# Patient Record
Sex: Female | Born: 1995 | Race: White | Hispanic: No | Smoking: Never smoker
Health system: Southern US, Community
[De-identification: ages and names within clinical notes are randomized; demographics above are authoritative.]

## PROBLEM LIST (undated history)

## (undated) DIAGNOSIS — M549 Dorsalgia, unspecified: Secondary | ICD-10-CM

## (undated) DIAGNOSIS — M1990 Unspecified osteoarthritis, unspecified site: Secondary | ICD-10-CM

## (undated) DIAGNOSIS — G43909 Migraine, unspecified, not intractable, without status migrainosus: Secondary | ICD-10-CM

## (undated) DIAGNOSIS — K589 Irritable bowel syndrome without diarrhea: Secondary | ICD-10-CM

## (undated) HISTORY — PX: OTHER SURGICAL HISTORY: SHX169

## (undated) HISTORY — PX: INDUCED ABORTION: SHX677

---
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
## 2017-06-02 ENCOUNTER — Emergency Department (HOSPITAL_COMMUNITY)
Admission: EM | Admit: 2017-06-02 | Discharge: 2017-06-03 | Disposition: A | Discharge status: Home / Self Care | Payer: Medicaid Other | Attending: Emergency Medicine | Admitting: Emergency Medicine

## 2017-06-02 ENCOUNTER — Encounter (HOSPITAL_COMMUNITY): Payer: Self-pay | Admitting: Family Medicine

## 2017-06-02 DIAGNOSIS — R51 Headache: Secondary | ICD-10-CM | POA: Diagnosis not present

## 2017-06-02 DIAGNOSIS — R519 Headache, unspecified: Secondary | ICD-10-CM

## 2017-06-02 DIAGNOSIS — Z79899 Other long term (current) drug therapy: Secondary | ICD-10-CM | POA: Insufficient documentation

## 2017-06-02 MED ORDER — KETOROLAC TROMETHAMINE 30 MG/ML IJ SOLN
30.0000 mg | Freq: Once | INTRAMUSCULAR | Status: AC
  Administered 2017-06-03: 30 mg via INTRAVENOUS
  Filled 2017-06-02: qty 1

## 2017-06-02 MED ORDER — METOCLOPRAMIDE HCL 5 MG/ML IJ SOLN
10.0000 mg | Freq: Once | INTRAMUSCULAR | Status: AC
  Administered 2017-06-03: 10 mg via INTRAVENOUS
  Filled 2017-06-02: qty 2

## 2017-06-02 MED ORDER — SODIUM CHLORIDE 0.9 % IV BOLUS (SEPSIS)
1000.0000 mL | Freq: Once | INTRAVENOUS | Status: AC
  Administered 2017-06-03: 1000 mL via INTRAVENOUS

## 2017-06-02 NOTE — ED Provider Notes (Signed)
[facility]-EMERGENCY DEPT Provider Note   CSN: [id number] Arrival date & time: 06/02/17  1708     History   Chief Complaint Chief Complaint  Patient presents with  . Headache    HPI Cassandra Hill is a 22 y.o. female with history of migraine headaches who presents with headache for 3 days.  She has had associated intermittent blurred vision, photosensitivity, photosensitivity, and dizziness.  Patient reports she has had 3 days of headache present more time than not.  It does remit, however returns.  It is gradual onset.  She states sometimes seeing stars before it starts.  She had another very similar headache a few months ago that lasted 5 days.  She denies any neck pain, fever, abdominal pain, nausea, vomiting, chest pain, shortness of breath.  She has taken ibuprofen at home without relief.  She last took 600 mg ibuprofen at 4:00p today.  HPI  History reviewed. No pertinent past medical history.  There are no active problems to display for this patient.   History reviewed. No pertinent surgical history.  OB History    No data available       Home Medications    Prior to Admission medications   Medication Sig Start Date End Date Taking? Authorizing Provider  ferrous sulfate 325 (65 FE) MG tablet Take 325 mg by mouth daily with breakfast.   Yes [provider]  ibuprofen (ADVIL,MOTRIN) 200 MG tablet Take 600 mg by mouth every 6 (six) hours as needed for moderate pain.   Yes [provider]    Family History History reviewed. No pertinent family history.  Social History Social History   Tobacco Use  . Smoking status: Never Smoker  . Smokeless tobacco: Never Used  Substance Use Topics  . Alcohol use: Yes    Comment: "Every once a while"   . Drug use: Yes    Types: Marijuana    Comment: Last use: 2 weeks ago     Allergies   Patient has no known allergies.   Review of Systems Review of Systems  Constitutional:  Negative for chills and fever.  HENT: Negative for facial swelling and sore throat.   Eyes: Positive for visual disturbance (intermittnet blurry vision).  Respiratory: Negative for shortness of breath.   Cardiovascular: Negative for chest pain.  Gastrointestinal: Negative for abdominal pain, nausea and vomiting.  Genitourinary: Negative for dysuria.  Musculoskeletal: Negative for back pain and neck pain.  Skin: Negative for rash and wound.  Neurological: Positive for dizziness and headaches.  Psychiatric/Behavioral: The patient is not nervous/anxious.      Physical Exam Updated Vital Signs BP (!) 114/55 (BP Location: Right Arm)   Pulse 64   Temp 98.2 F (36.8 C) (Oral)   Resp 13   Ht 5\' 1"  (1.549 m)   Wt 64.4 kg (142 lb)   LMP 05/24/2017   SpO2 98%   BMI 26.83 kg/m   Physical Exam  Constitutional: She appears well-developed and well-nourished. No distress.  HENT:  Head: Normocephalic and atraumatic.  Mouth/Throat: Oropharynx is clear and moist. No oropharyngeal exudate.  Eyes: Conjunctivae are normal. Pupils are equal, round, and reactive to light. Right eye exhibits no discharge. Left eye exhibits no discharge. No scleral icterus.  Neck: Normal range of motion. Neck supple. No thyromegaly present.  Cardiovascular: Normal rate, regular rhythm, normal heart sounds and intact distal pulses. Exam reveals no gallop and no friction rub.  No murmur heard. Pulmonary/Chest: Effort normal and breath  sounds normal. No stridor. No respiratory distress. She has no wheezes. She has no rales.  Abdominal: Soft. Bowel sounds are normal. She exhibits no distension. There is no tenderness. There is no rebound and no guarding.  Musculoskeletal: She exhibits no edema.  Lymphadenopathy:    She has no cervical adenopathy.  Neurological: She is alert. Coordination normal.  CN 3-12 intact; normal sensation throughout; 5/5 strength in all 4 extremities; equal bilateral grip strength; no ataxia on  finger-to-nose  Skin: Skin is warm and dry. No rash noted. She is not diaphoretic. No pallor.  Psychiatric: She has a normal mood and affect.  Nursing note and vitals reviewed.    ED Treatments / Results  Labs (all labs ordered are listed, but only abnormal results are displayed) Labs Reviewed - No data to display  EKG  EKG Interpretation None       Radiology No results found.  Procedures Procedures (including critical care time)  Medications Ordered in ED Medications  dexamethasone (DECADRON) injection 10 mg (not administered)  ketorolac (TORADOL) 30 MG/ML injection 30 mg (30 mg Intravenous Given 06/03/17 0012)  sodium chloride 0.9 % bolus 1,000 mL (1,000 mLs Intravenous New Bag/Given 06/03/17 0011)  metoCLOPramide (REGLAN) injection 10 mg (10 mg Intravenous Given 06/03/17 0011)     Initial Impression / Assessment and Plan / ED Course  I have reviewed the triage vital signs and the nursing notes.  Pertinent labs & imaging results that were available during my care of the patient were reviewed by me and considered in my medical decision making (see chart for details).     Pt HA treated and improved while in ED with Toradol, Reglan, fluid bolus, Decadron.  Presentation is like pts typical HA and non concerning for [facility], ICH, Meningitis, or temporal arteritis. Pt is afebrile with no focal neuro deficits, nuchal rigidity, or change in vision. Pt is to follow up with neurology to discuss prophylactic medication and further evaluation of ongoing headaches.  Return precautions discussed.  Patient understands and agrees with plan.  Patient vitals stable throughout ED course and discharged in satisfactory condition.  Final Clinical Impressions(s) / ED Diagnoses   Final diagnoses:  Bad headache    ED Discharge Orders    None       Emi HolesLaw, Erron Wengert M, PA-C 06/03/17 0112    Shaune PollackIsaacs, Cameron, MD 06/03/17 1200

## 2017-06-02 NOTE — ED Notes (Signed)
Bed: WA01 Expected date:  Expected time:  Means of arrival:  Comments: conscious sedation

## 2017-06-02 NOTE — ED Triage Notes (Signed)
Patient is experiencing a frontal headache that started 3 days ago. Patient related this initially as a sinus headache, took Benadryl with no relief. Patient reports she has been taking OTC Tylenol and Ibuprofen for pain with no relief. She reports she has a history of headaches. Also, reports blurry vision earlier and light sensitive.

## 2017-06-03 MED ORDER — DEXAMETHASONE SODIUM PHOSPHATE 10 MG/ML IJ SOLN
10.0000 mg | Freq: Once | INTRAMUSCULAR | Status: AC
  Administered 2017-06-03: 10 mg via INTRAVENOUS
  Filled 2017-06-03: qty 1

## 2017-06-03 NOTE — Discharge Instructions (Signed)
Please follow-up with neurology for further evaluation and treatment of your ongoing headaches.  You may want to start a headache journal to track her headaches and bring it to your neurology appointment.  Please return the emergency department if you develop any new or worsening symptoms.

## 2017-07-20 ENCOUNTER — Encounter (HOSPITAL_COMMUNITY): Payer: Self-pay | Admitting: Emergency Medicine

## 2017-07-20 ENCOUNTER — Emergency Department (HOSPITAL_COMMUNITY)
Admission: EM | Admit: 2017-07-20 | Discharge: 2017-07-20 | Disposition: A | Discharge status: Home / Self Care | Payer: Medicaid Other | Attending: Emergency Medicine | Admitting: Emergency Medicine

## 2017-07-20 ENCOUNTER — Other Ambulatory Visit: Payer: Self-pay

## 2017-07-20 DIAGNOSIS — N3000 Acute cystitis without hematuria: Secondary | ICD-10-CM | POA: Diagnosis not present

## 2017-07-20 DIAGNOSIS — R300 Dysuria: Secondary | ICD-10-CM | POA: Diagnosis present

## 2017-07-20 DIAGNOSIS — B9689 Other specified bacterial agents as the cause of diseases classified elsewhere: Secondary | ICD-10-CM | POA: Insufficient documentation

## 2017-07-20 DIAGNOSIS — N760 Acute vaginitis: Secondary | ICD-10-CM | POA: Insufficient documentation

## 2017-07-20 HISTORY — DX: Migraine, unspecified, not intractable, without status migrainosus: G43.909

## 2017-07-20 HISTORY — DX: Dorsalgia, unspecified: M54.9

## 2017-07-20 HISTORY — DX: Unspecified osteoarthritis, unspecified site: M19.90

## 2017-07-20 LAB — WET PREP, GENITAL
SPERM: NONE SEEN
Trich, Wet Prep: NONE SEEN
Yeast Wet Prep HPF POC: NONE SEEN

## 2017-07-20 LAB — URINALYSIS, ROUTINE W REFLEX MICROSCOPIC
Bilirubin Urine: NEGATIVE
GLUCOSE, UA: NEGATIVE mg/dL
HGB URINE DIPSTICK: NEGATIVE
Ketones, ur: NEGATIVE mg/dL
NITRITE: NEGATIVE
PROTEIN: 30 mg/dL — AB
Specific Gravity, Urine: 1.027 (ref 1.005–1.030)
pH: 6 (ref 5.0–8.0)

## 2017-07-20 LAB — POC URINE PREG, ED: Preg Test, Ur: NEGATIVE

## 2017-07-20 MED ORDER — SULFAMETHOXAZOLE-TRIMETHOPRIM 800-160 MG PO TABS: 1.0000 | ORAL_TABLET | Freq: Two times a day (BID) | ORAL | 0 refills | Status: AC

## 2017-07-20 MED ORDER — METRONIDAZOLE 500 MG PO TABS: 500.0000 mg | ORAL_TABLET | Freq: Two times a day (BID) | ORAL | 0 refills | Status: DC

## 2017-07-20 MED ORDER — PHENAZOPYRIDINE HCL 200 MG PO TABS: 200.0000 mg | ORAL_TABLET | Freq: Three times a day (TID) | ORAL | 0 refills | Status: DC

## 2017-07-20 NOTE — ED Triage Notes (Signed)
Pt c/o burning on urination and urgency to void x 9 days. Tx with cranberry supplements. Urine cloudy, straw yellow,

## 2017-07-20 NOTE — Discharge Instructions (Signed)
We are treating you for bacterial vaginosis and urinary tract infection. Do not drink alcohol while taking the medication as it will make you very sick if you do. Follow up with the health department [facility].

## 2017-07-20 NOTE — ED Provider Notes (Signed)
[facility]-EMERGENCY DEPT Provider Note   CSN: [id number] Arrival date & time: 07/20/17  1639     History   Chief Complaint Chief Complaint  Patient presents with  . Urinary Tract Infection    HPI Cassandra Hill is a 22 y.o. female who presents to the ED with UTI symptoms that started 9 days ago. Patient reports that initially she had frequency and then it started to burn. She took cranberry supplements and drank lots of water. The symptoms got better for a few days but then returned and got worse. Today the burning and urgency was so bad she got Azo to try and relieve the pain. Patient reports she she is sexually active and using condoms; however, the partner she had prior to this one left when she was pregnant so patient had SAB @ [redacted] weeks gestation and has not had follow up since then. Patient request STI testing today.   The history is provided by the patient. No language interpreter was used.  Urinary Tract Infection   This is a new problem. The current episode started more than 1 week ago. The problem occurs every urination. The problem has been gradually worsening. The quality of the pain is described as burning. There has been no fever. She is sexually active. Associated symptoms include frequency and urgency. Pertinent negatives include no chills, no nausea and no vomiting. She has tried increased fluids (Azo, cranberry supplement ) for the symptoms.    Past Medical History:  Diagnosis Date  . Arthritis   . Back pain   . Migraine     There are no active problems to display for this patient.   Past Surgical History:  Procedure Laterality Date  . INDUCED ABORTION    . tubes in ears      OB History    No data available       Home Medications    Prior to Admission medications   Medication Sig Start Date End Date Taking? Authorizing Provider  ferrous sulfate 325 (65 FE) MG tablet Take 325 mg by mouth daily with breakfast.    [provider]  ibuprofen (ADVIL,MOTRIN) 200 MG tablet Take 600 mg by mouth every 6 (six) hours as needed for moderate pain.    [provider]  metroNIDAZOLE (FLAGYL) 500 MG tablet Take 1 tablet (500 mg total) by mouth 2 (two) times daily. 07/20/17   Janne Napoleon, NP  phenazopyridine (PYRIDIUM) 200 MG tablet Take 1 tablet (200 mg total) by mouth 3 (three) times daily. 07/20/17   Janne Napoleon, NP  sulfamethoxazole-trimethoprim (BACTRIM DS,SEPTRA DS) 800-160 MG tablet Take 1 tablet by mouth 2 (two) times daily for 7 days. 07/20/17 07/27/17  Janne Napoleon, NP    Family History Family History  Problem Relation Age of Onset  . Diabetes Mother   . Hypertension Mother   . Hypertension Father     Social History Social History   Tobacco Use  . Smoking status: Never Smoker  . Smokeless tobacco: Never Used  Substance Use Topics  . Alcohol use: Yes    Comment: "Every once a while"   . Drug use: Yes    Types: Marijuana    Comment: Last use: 2 weeks ago     Allergies   Patient has no known allergies.   Review of Systems Review of Systems  Constitutional: Negative for chills and fever.  HENT: Negative.   Respiratory: Negative for cough.   Gastrointestinal: Positive  for abdominal pain. Negative for nausea and vomiting.  Genitourinary: Positive for dysuria, frequency, urgency and vaginal discharge. Negative for difficulty urinating and vaginal bleeding.  Musculoskeletal: Negative for back pain.  Skin: Negative for rash.  Neurological: Negative for headaches.  Psychiatric/Behavioral: Negative for confusion.     Physical Exam Updated Vital Signs BP 136/76 (BP Location: Left Arm)   Pulse 68   Temp 98.9 F (37.2 C)   Resp 14   LMP 06/13/2017 (Exact Date)   SpO2 100%   Physical Exam  Constitutional: She appears well-developed and well-nourished. No distress.  HENT:  Head: Normocephalic.  Eyes: EOM are normal.  Neck: Neck supple.  Cardiovascular: Normal rate.    Pulmonary/Chest: Effort normal.  Abdominal: Soft. There is tenderness in the suprapubic area. There is no rebound, no guarding and no CVA tenderness.  Genitourinary:  Genitourinary Comments: External genitalia without lesions, frothy d/c vaginal vault. No CMT, no adnexal tenderness or mass palpated. Uterus not enlarged.   Musculoskeletal: Normal range of motion.  Neurological: She is alert.  Skin: Skin is warm and dry.  Psychiatric: She has a normal mood and affect.  Nursing note and vitals reviewed.    ED Treatments / Results  Labs (all labs ordered are listed, but only abnormal results are displayed) Labs Reviewed  WET PREP, GENITAL - Abnormal; Notable for the following components:      Result Value   Clue Cells Wet Prep HPF POC PRESENT (*)    WBC, Wet Prep HPF POC FEW (*)    All other components within normal limits  URINALYSIS, ROUTINE W REFLEX MICROSCOPIC - Abnormal; Notable for the following components:   Protein, ur 30 (*)    Leukocytes, UA LARGE (*)    Bacteria, UA RARE (*)    Squamous Epithelial / LPF 0-5 (*)    All other components within normal limits  URINE CULTURE  POC URINE PREG, ED  GC/CHLAMYDIA PROBE AMP ([place]   Radiology No results found.  Procedures Procedures (including critical care time)  Medications Ordered in ED Medications - No data to display   Initial Impression / Assessment and Plan / ED Course  I have reviewed the triage vital signs and the nursing notes. Pt has been diagnosed with a UTI. Pt is afebrile, no CVA tenderness, normotensive, and denies N/V. Pt to be dc home with antibiotics and instructions to follow up with PCP if symptoms persist.  Pt presents with concerns for possible STD.  Pt understands that they have GC/Chlamydia cultures pending and that they will need to inform all sexual partners if results return positive. Pt not concerning for PID because hemodynamically stable and no cervical motion tenderness on  pelvic exam. Pt has also been treated with Flagyl for Bacterial Vaginosis. Pt has been advised to not drink alcohol while on this medication.  Patient to be discharged with instructions to follow up with [facility]. Discussed importance of using protection when sexually active.   Final Clinical Impressions(s) / ED Diagnoses   Final diagnoses:  BV (bacterial vaginosis)  Acute cystitis without hematuria    ED Discharge Orders        Ordered    sulfamethoxazole-trimethoprim (BACTRIM DS,SEPTRA DS) 800-160 MG tablet  2 times daily     07/20/17 2027    metroNIDAZOLE (FLAGYL) 500 MG tablet  2 times daily     07/20/17 2027    phenazopyridine (PYRIDIUM) 200 MG tablet  3 times daily     07/20/17 2027  Kerrie Buffalo [place], [place] 07/20/17 2049    Pricilla Loveless, MD 07/20/17 [id number]

## 2017-07-21 LAB — GC/CHLAMYDIA PROBE AMP (~~LOC~~) NOT AT ARMC
Chlamydia: POSITIVE — AB
Neisseria Gonorrhea: NEGATIVE

## 2017-07-22 LAB — URINE CULTURE

## 2017-07-23 ENCOUNTER — Telehealth: Payer: Self-pay | Admitting: Medical

## 2017-07-23 DIAGNOSIS — A749 Chlamydial infection, unspecified: Secondary | ICD-10-CM

## 2017-07-23 MED ORDER — AZITHROMYCIN 250 MG PO TABS
1000.0000 mg | ORAL_TABLET | Freq: Once | ORAL | 0 refills | Status: AC
Start: 2017-07-23 — End: 2017-07-23

## 2017-07-23 NOTE — Telephone Encounter (Addendum)
Cassandra Hill tested positive for  Chlamydia. Patient was called by RN and allergies and pharmacy confirmed. Rx sent to pharmacy of choice.   Cassandra Hill, Cassandra Hill N, PA-C 07/23/2017 10:07 AM      ----- Message from Kathe BectonLori S Berdik, RN sent at 07/23/2017  9:52 AM EDT ----- This patient tested positive for :  chlamydia  She "has NKDA", I have informed the patient of her results and confirmed her pharmacy is correct in her chart. Please send Rx.   Thank you,   Kathe BectonBerdik, Lori S, RN   [facility]

## 2017-07-28 ENCOUNTER — Emergency Department (HOSPITAL_COMMUNITY)
Admission: EM | Admit: 2017-07-28 | Discharge: 2017-07-28 | Disposition: A | Discharge status: Home / Self Care | Payer: Medicaid Other | Attending: Emergency Medicine | Admitting: Emergency Medicine

## 2017-07-28 ENCOUNTER — Encounter (HOSPITAL_COMMUNITY): Payer: Self-pay | Admitting: Emergency Medicine

## 2017-07-28 DIAGNOSIS — Y939 Activity, unspecified: Secondary | ICD-10-CM | POA: Diagnosis not present

## 2017-07-28 DIAGNOSIS — Y92009 Unspecified place in unspecified non-institutional (private) residence as the place of occurrence of the external cause: Secondary | ICD-10-CM | POA: Diagnosis not present

## 2017-07-28 DIAGNOSIS — Y999 Unspecified external cause status: Secondary | ICD-10-CM | POA: Insufficient documentation

## 2017-07-28 DIAGNOSIS — M79642 Pain in left hand: Secondary | ICD-10-CM | POA: Insufficient documentation

## 2017-07-28 DIAGNOSIS — S80861A Insect bite (nonvenomous), right lower leg, initial encounter: Secondary | ICD-10-CM | POA: Diagnosis not present

## 2017-07-28 DIAGNOSIS — M79641 Pain in right hand: Secondary | ICD-10-CM | POA: Diagnosis not present

## 2017-07-28 DIAGNOSIS — W57XXXA Bitten or stung by nonvenomous insect and other nonvenomous arthropods, initial encounter: Secondary | ICD-10-CM | POA: Diagnosis not present

## 2017-07-28 DIAGNOSIS — S80862A Insect bite (nonvenomous), left lower leg, initial encounter: Secondary | ICD-10-CM | POA: Diagnosis not present

## 2017-07-28 DIAGNOSIS — L539 Erythematous condition, unspecified: Secondary | ICD-10-CM | POA: Diagnosis present

## 2017-07-28 DIAGNOSIS — Z79899 Other long term (current) drug therapy: Secondary | ICD-10-CM | POA: Insufficient documentation

## 2017-07-28 NOTE — ED Triage Notes (Addendum)
Pt c/o swelling of finger tips and possibly allergic reaction, itchng, pt states this started yesterday after touching a bag of dog food. Pt also reports dog has fleas and she has flea bites on legs. Pt also wants to f/u on recent dx of Chlamydia, pt recently finished antibiotic.

## 2017-07-28 NOTE — Discharge Instructions (Signed)
Follow up with the health department for test of cure and other STD testing from your previous visit.  If your redness and itching of your fingers return take Benadryl.

## 2017-07-29 NOTE — ED Provider Notes (Signed)
[facility]-EMERGENCY DEPT Provider Note   CSN: [id number] Arrival date & time: 07/28/17  1215     History   Chief Complaint Chief Complaint  Patient presents with  . swelling of finger tips  . possible allergic reaction    HP Cassandra Hill is a 22 y.o. female who presents to the ED with c/o finger tips bilateral hands itching and redness. Patient reports that she thinks it may have been from touching a dog food. Patient also reports that her dog has fleas and she has flea bites.   HPI  Past Medical History:  Diagnosis Date  . Arthritis   . Back pain   . Migraine     There are no active problems to display for this patient.   Past Surgical History:  Procedure Laterality Date  . INDUCED ABORTION    . tubes in ears      OB History    No data available       Home Medications    Prior to Admission medications   Medication Sig Start Date End Date Taking? Authorizing Provider  ferrous sulfate 325 (65 FE) MG tablet Take 325 mg by mouth daily with breakfast.    [provider]  ibuprofen (ADVIL,MOTRIN) 200 MG tablet Take 600 mg by mouth every 6 (six) hours as needed for moderate pain.    [provider]  metroNIDAZOLE (FLAGYL) 500 MG tablet Take 1 tablet (500 mg total) by mouth 2 (two) times daily. 07/20/17   Janne Napoleon, NP  phenazopyridine (PYRIDIUM) 200 MG tablet Take 1 tablet (200 mg total) by mouth 3 (three) times daily. 07/20/17   Janne Napoleon, NP    Family History Family History  Problem Relation Age of Onset  . Diabetes Mother   . Hypertension Mother   . Hypertension Father     Social History Social History   Tobacco Use  . Smoking status: Never Smoker  . Smokeless tobacco: Never Used  Substance Use Topics  . Alcohol use: Yes    Comment: "Every once a while"   . Drug use: Yes    Types: Marijuana    Comment: Last use: 2 weeks ago     Allergies   Patient has no known allergies.   Review of  Systems Review of Systems  Skin: Positive for color change and rash.     Physical Exam Updated Vital Signs BP 127/66 (BP Location: Right Arm)   Pulse 68   Temp 98.6 F (37 C) (Oral)   Resp 17   LMP 06/30/2017 (Exact Date)   SpO2 100%   Physical Exam  Constitutional: She appears well-developed and well-nourished. No distress.  HENT:  Head: Normocephalic.  Eyes: EOM are normal.  Neck: Neck supple.  Cardiovascular: Normal rate.  Pulmonary/Chest: Effort normal.  Musculoskeletal: Normal range of motion.  On exam there is no abnormality to the finger tips. (patient reports that the symptoms have resolved).  Neurological: She is alert.  Skin: Skin is warm and dry.  Flea bites to lower legs  Psychiatric: She has a normal mood and affect.  Nursing note and vitals reviewed.    ED Treatments / Results  Labs (all labs ordered are listed, but only abnormal results are displayed) Labs Reviewed - No data to display Radiology No results found.  Procedures Procedures (including critical care time)  Medications Ordered in ED Medications - No data to display   Initial Impression / Assessment and Plan / ED Course  I have reviewed the triage vital signs and the nursing notes. 22 y.o. female here for flea bites to legs and redness to the finger tips that has resolved stable for d/c without any symptoms at this time.   Patient also asking about follow up of her positive Chlamydia from her last visit. Discussed with the patient that she can go to the health department for f/u and additional STI testing. Patient agrees with plan.   Final Clinical Impressions(s) / ED Diagnoses   Final diagnoses:  Bilateral hand pain    ED Discharge Orders    None       Kerrie Buffaloeese, [place], [place] 07/29/17 1136    Wynetta FinesMessick, Peter C, MD 08/09/17 1157

## 2017-09-12 ENCOUNTER — Encounter (HOSPITAL_COMMUNITY): Payer: Self-pay | Admitting: Emergency Medicine

## 2017-09-12 ENCOUNTER — Emergency Department (HOSPITAL_COMMUNITY)
Admission: EM | Admit: 2017-09-12 | Discharge: 2017-09-12 | Disposition: A | Discharge status: Home / Self Care | Payer: Medicaid Other | Attending: Emergency Medicine | Admitting: Emergency Medicine

## 2017-09-12 DIAGNOSIS — L02211 Cutaneous abscess of abdominal wall: Secondary | ICD-10-CM | POA: Insufficient documentation

## 2017-09-12 MED ORDER — CEPHALEXIN 500 MG PO CAPS: 500.0000 mg | ORAL_CAPSULE | Freq: Three times a day (TID) | ORAL | 0 refills | Status: DC

## 2017-09-12 MED ORDER — LIDOCAINE-EPINEPHRINE (PF) 2 %-1:200000 IJ SOLN
20.0000 mL | Freq: Once | INTRAMUSCULAR | Status: AC
  Administered 2017-09-12: 20 mL via INTRADERMAL
  Filled 2017-09-12: qty 20

## 2017-09-12 NOTE — ED Provider Notes (Signed)
[facility]-EMERGENCY DEPT Provider Note   CSN: [id number] Arrival date & time: 09/12/17  0901     History   Chief Complaint Chief Complaint  Patient presents with  . Abscess    HPI Cassandra Hill is a 22 y.o. female.  HPI Cassandra Hill is a 22 y.o. female resents emergency department complaining of an abscess to her abdomen.  She noticed a bump 3 days ago, states that she tried to squeeze the bump, however she was unable to get any pus out.  She states the more she messed with it that bigger and more red it became.  She states now it is tender, she has redness around the abscess, denies any fever or chills.  Denies any nausea, vomiting.  No history of the same.     Past Medical History:  Diagnosis Date  . Arthritis   . Back pain   . Migraine     There are no active problems to display for this patient.   Past Surgical History:  Procedure Laterality Date  . INDUCED ABORTION    . tubes in ears       OB History   None      Home Medications    Prior to Admission medications   Medication Sig Start Date End Date Taking? Authorizing Provider  diphenhydrAMINE (BENADRYL) 25 MG tablet Take 25 mg by mouth daily as needed for allergies.   Yes [provider]  ferrous sulfate 325 (65 FE) MG tablet Take 325 mg by mouth daily with breakfast.   Yes [provider]  ibuprofen (ADVIL,MOTRIN) 200 MG tablet Take 600 mg by mouth every 6 (six) hours as needed for moderate pain.   Yes [provider]  metroNIDAZOLE (FLAGYL) 500 MG tablet Take 1 tablet (500 mg total) by mouth 2 (two) times daily. Patient not taking: Reported on 09/12/2017 07/20/17   Janne Napoleon, NP  phenazopyridine (PYRIDIUM) 200 MG tablet Take 1 tablet (200 mg total) by mouth 3 (three) times daily. Patient not taking: Reported on 09/12/2017 07/20/17   Janne Napoleon, NP    Family History Family History  Problem Relation Age of Onset  . Diabetes Mother   .  Hypertension Mother   . Hypertension Father     Social History Social History   Tobacco Use  . Smoking status: Never Smoker  . Smokeless tobacco: Never Used  Substance Use Topics  . Alcohol use: Yes    Comment: "Every once a while"   . Drug use: Yes    Types: Marijuana    Comment: Last use: 2 weeks ago     Allergies   Patient has no known allergies.   Review of Systems Review of Systems  Constitutional: Negative for chills and fever.  Respiratory: Negative for cough, chest tightness and shortness of breath.   Cardiovascular: Negative for chest pain, palpitations and leg swelling.  Gastrointestinal: Positive for abdominal pain.  Musculoskeletal: Negative for arthralgias and myalgias.  Skin: Positive for wound. Negative for rash.  Neurological: Negative for dizziness, weakness and headaches.  All other systems reviewed and are negative.    Physical Exam Updated Vital Signs BP 124/72 (BP Location: Right Arm)   Pulse 78   Temp 98 F (36.7 C) (Oral)   Resp (!) 100   SpO2 99%   Physical Exam  Constitutional: She appears well-developed and well-nourished. No distress.  Eyes: Conjunctivae are normal.  Neck: Neck supple.  Abdominal:  3 x 3  cm area of induration to the left lower abdominal wall with surrounding cellulitis.  Area is warm, tender, no drainage.  Neurological: She is alert.  Skin: Skin is warm and dry.  Nursing note and vitals reviewed.    ED Treatments / Results  Labs (all labs ordered are listed, but only abnormal results are displayed) Labs Reviewed - No data to display  EKG None  Radiology No results found.  Procedures .Marland KitchenIncision and Drainage Date/Time: 09/12/2017 10:33 AM Performed by: Jaynie Crumble, PA-C Authorized by: Jaynie Crumble, PA-C   Consent:    Consent obtained:  Verbal   Consent given by:  Patient   Risks discussed:  Incomplete drainage, bleeding, pain and damage to other organs   Alternatives discussed:  No  treatment Location:    Type:  Abscess   Size:  3cm   Location:  Trunk   Trunk location:  Abdomen Pre-procedure details:    Skin preparation:  Betadine Anesthesia (see MAR for exact dosages):    Anesthesia method:  Local infiltration   Local anesthetic:  Lidocaine 2% WITH epi Procedure type:    Complexity:  Simple Procedure details:    Incision types:  Single straight   Incision depth:  Dermal   Scalpel blade:  11   Wound management:  Probed and deloculated and irrigated with saline   Drainage:  Purulent   Drainage amount:  Moderate   Packing materials:  1/4 in gauze Post-procedure details:    Patient tolerance of procedure:  Tolerated well, no immediate complications   (including critical care time)  Medications Ordered in ED Medications  lidocaine-EPINEPHrine (XYLOCAINE W/EPI) 2 %-1:100000 (with pres) injection 20 mL (has no administration in time range)     Initial Impression / Assessment and Plan / ED Course  I have reviewed the triage vital signs and the nursing notes.  Pertinent labs & imaging results that were available during my care of the patient were reviewed by me and considered in my medical decision making (see chart for details).     Patient in emergency department with an abscess to the left abdominal wall.  There is minimal surrounding cellulitis.  She is afebrile, otherwise nontoxic appearing.  Abscess incised and drained.  Will discharge home with antibiotics, follow-up for recheck as needed.  Return precautions discussed.  Vitals:   09/12/17 0913  BP: 124/72  Pulse: 78  Resp: (!) 100  Temp: 98 F (36.7 C)  TempSrc: Oral  SpO2: 99%    Final Clinical Impressions(s) / ED Diagnoses   Final diagnoses:  Abdominal wall abscess    ED Discharge Orders        Ordered    cephALEXin (KEFLEX) 500 MG capsule  3 times daily     09/12/17 1035       Jaynie Crumble, PA-C 09/12/17 1608    Lorre Nick, MD 09/14/17 2313

## 2017-09-12 NOTE — Discharge Instructions (Addendum)
Take antibiotics as prescribed until all gone. Keep close eye on redness and swelling. If not improving or worsening return to er. Otherwise warm compresses or soaks. Pull out packing in two days.

## 2017-09-12 NOTE — ED Triage Notes (Signed)
Patient here from home with complaints of right lower abdomen. Reports that she drained the area herself and cleaned it with peroxide.

## 2017-09-12 NOTE — ED Notes (Signed)
Patient c/o of small raised bump/scab on left upper abdomen. Patient states she tried to drain it and it seemed to get bigger. Swelling around the bump/scab is about the side of a half-dollar.

## 2017-10-07 ENCOUNTER — Encounter (HOSPITAL_COMMUNITY): Payer: Self-pay

## 2017-10-07 ENCOUNTER — Emergency Department (HOSPITAL_COMMUNITY)
Admission: EM | Admit: 2017-10-07 | Discharge: 2017-10-07 | Disposition: A | Discharge status: Home / Self Care | Payer: Medicaid Other | Attending: Emergency Medicine | Admitting: Emergency Medicine

## 2017-10-07 ENCOUNTER — Other Ambulatory Visit: Payer: Self-pay

## 2017-10-07 DIAGNOSIS — R3915 Urgency of urination: Secondary | ICD-10-CM | POA: Diagnosis present

## 2017-10-07 DIAGNOSIS — N3001 Acute cystitis with hematuria: Secondary | ICD-10-CM

## 2017-10-07 DIAGNOSIS — Z79899 Other long term (current) drug therapy: Secondary | ICD-10-CM | POA: Insufficient documentation

## 2017-10-07 LAB — URINALYSIS, ROUTINE W REFLEX MICROSCOPIC
BILIRUBIN URINE: NEGATIVE
Glucose, UA: NEGATIVE mg/dL
Ketones, ur: NEGATIVE mg/dL
Nitrite: POSITIVE — AB
Protein, ur: NEGATIVE mg/dL
SPECIFIC GRAVITY, URINE: 1.021 (ref 1.005–1.030)
pH: 5 (ref 5.0–8.0)

## 2017-10-07 LAB — POC URINE PREG, ED: PREG TEST UR: NEGATIVE

## 2017-10-07 MED ORDER — CIPROFLOXACIN HCL 500 MG PO TABS: 500.0000 mg | ORAL_TABLET | Freq: Two times a day (BID) | ORAL | 0 refills | Status: DC

## 2017-10-07 MED ORDER — PHENAZOPYRIDINE HCL 200 MG PO TABS: 200.0000 mg | ORAL_TABLET | Freq: Three times a day (TID) | ORAL | 0 refills | Status: DC

## 2017-10-07 NOTE — ED Provider Notes (Signed)
[facility]-EMERGENCY DEPT Provider Note   CSN: [id number] Arrival date & time: 10/07/17  1214     History   Chief Complaint No chief complaint on file.   HPI Cassandra Hill is a 22 y.o. female who presents to the ED with UTI symptoms. Patient reports that 4 days ago she had urgency and frequency, today she has burning. Patient reports drinking lots of water but has not helped. Patient reports she is having more frequent UTI over the past year.   The history is provided by the patient. No language interpreter was used.  Dysuria   This is a new problem. The current episode started more than 2 days ago. The problem occurs every urination. The problem has been gradually worsening. The quality of the pain is described as burning. The pain is at a severity of 5/10. There has been no fever. She is sexually active. Associated symptoms include frequency and urgency. Pertinent negatives include no chills, no nausea, no vomiting, no discharge, no hematuria, no possible pregnancy and no flank pain. She has tried increased fluids for the symptoms. Her past medical history is significant for recurrent UTIs.    Past Medical History:  Diagnosis Date  . Arthritis   . Back pain   . Migraine     There are no active problems to display for this patient.   Past Surgical History:  Procedure Laterality Date  . INDUCED ABORTION    . tubes in ears       OB History   None      Home Medications    Prior to Admission medications   Medication Sig Start Date End Date Taking? Authorizing Provider  diphenhydrAMINE (BENADRYL) 25 MG tablet Take 25 mg by mouth daily as needed for allergies.   Yes [provider]  ferrous sulfate 325 (65 FE) MG tablet Take 325 mg by mouth daily with breakfast.   Yes [provider]  ibuprofen (ADVIL,MOTRIN) 200 MG tablet Take 600 mg by mouth every 6 (six) hours as needed for moderate pain.   Yes [provider]    ciprofloxacin (CIPRO) 500 MG tablet Take 1 tablet (500 mg total) by mouth every 12 (twelve) hours. 10/07/17   Janne Napoleon, NP  phenazopyridine (PYRIDIUM) 200 MG tablet Take 1 tablet (200 mg total) by mouth 3 (three) times daily. 10/07/17   Janne Napoleon, NP    Family History Family History  Problem Relation Age of Onset  . Diabetes Mother   . Hypertension Mother   . Hypertension Father     Social History Social History   Tobacco Use  . Smoking status: Never Smoker  . Smokeless tobacco: Never Used  Substance Use Topics  . Alcohol use: Yes    Comment: "Every once a while"   . Drug use: Yes    Types: Marijuana    Comment: Last use: 2 weeks ago     Allergies   Patient has no known allergies.   Review of Systems Review of Systems  Constitutional: Negative for chills.  HENT: Negative.   Eyes: Negative for visual disturbance.  Respiratory: Negative for shortness of breath.   Cardiovascular: Negative for chest pain.  Gastrointestinal: Positive for abdominal pain. Negative for nausea and vomiting.  Genitourinary: Positive for dysuria, frequency and urgency. Negative for flank pain and hematuria.  Musculoskeletal: Back pain: chronic.  Skin: Negative for rash.  Neurological: Negative for headaches.  Psychiatric/Behavioral: Negative for confusion.     Physical  Exam Updated Vital Signs BP 120/71 (BP Location: Right Arm)   Pulse 74   Temp 98 F (36.7 C) (Oral)   Resp 18   Ht  (1.626 m)   Wt 68.9 kg (152 lb)   SpO2 100%   BMI 26.09 kg/m   Physical Exam  Constitutional: She appears well-developed and well-nourished. No distress.  HENT:  Head: Normocephalic.  Eyes: EOM are normal.  Neck: Neck supple.  Cardiovascular: Normal rate.  Pulmonary/Chest: Effort normal.  Abdominal: Soft. There is tenderness in the suprapubic area. There is no rebound, no guarding and no CVA tenderness.  Musculoskeletal: Normal range of motion.  Neurological: She is alert.  Skin:  Skin is warm and dry.  Psychiatric: She has a normal mood and affect. Her behavior is normal.  Nursing note and vitals reviewed.    ED Treatments / Results  Labs (all labs ordered are listed, but only abnormal results are displayed) Labs Reviewed  URINALYSIS, ROUTINE W REFLEX MICROSCOPIC - Abnormal; Notable for the following components:      Result Value   Color, Urine AMBER (*)    Hgb urine dipstick SMALL (*)    Nitrite POSITIVE (*)    Leukocytes, UA MODERATE (*)    Bacteria, UA RARE (*)    All other components within normal limits  POC URINE PREG, ED  Radiology No results found.  Procedures Procedures (including critical care time)  Medications Ordered in ED Medications - No data to display   Initial Impression / Assessment and Plan / ED Course  I have reviewed the triage vital signs and the nursing notes. Pt has been diagnosed with a UTI. Pt is afebrile, no CVA tenderness, normotensive, and denies N/V. Pt to be dc home with antibiotics and instructions to follow up with urology since she is having more frequent UTI's.  Final Clinical Impressions(s) / ED Diagnoses   Final diagnoses:  Acute cystitis with hematuria    ED Discharge Orders        Ordered    phenazopyridine (PYRIDIUM) 200 MG tablet  3 times daily     10/07/17 1505    ciprofloxacin (CIPRO) 500 MG tablet  Every 12 hours     10/07/17 1505       Kerrie Buffalo Nikolai, NP 10/07/17 1520    Nira Conn, MD 10/07/17 1615

## 2017-10-07 NOTE — ED Triage Notes (Signed)
Patient presented with c/o pressure to the bladder for past 2-3 days . Pt c/o burning sensation with urinating.

## 2017-10-07 NOTE — Discharge Instructions (Signed)
Call and schedule a follow up with the Urologist.

## 2017-10-22 ENCOUNTER — Encounter (HOSPITAL_COMMUNITY): Payer: Self-pay | Admitting: Emergency Medicine

## 2017-10-22 ENCOUNTER — Emergency Department (HOSPITAL_COMMUNITY)
Admission: EM | Admit: 2017-10-22 | Discharge: 2017-10-22 | Disposition: A | Discharge status: Home / Self Care | Payer: Medicaid Other | Attending: Emergency Medicine | Admitting: Emergency Medicine

## 2017-10-22 ENCOUNTER — Other Ambulatory Visit: Payer: Self-pay

## 2017-10-22 ENCOUNTER — Emergency Department (HOSPITAL_COMMUNITY): Payer: Medicaid Other

## 2017-10-22 DIAGNOSIS — Z79899 Other long term (current) drug therapy: Secondary | ICD-10-CM | POA: Diagnosis not present

## 2017-10-22 DIAGNOSIS — J069 Acute upper respiratory infection, unspecified: Secondary | ICD-10-CM | POA: Insufficient documentation

## 2017-10-22 DIAGNOSIS — R05 Cough: Secondary | ICD-10-CM | POA: Diagnosis present

## 2017-10-22 MED ORDER — FLUTICASONE PROPIONATE 50 MCG/ACT NA SUSP: 1.0000 | Freq: Every day | NASAL | 2 refills | Status: DC

## 2017-10-22 MED ORDER — BENZONATATE 100 MG PO CAPS: 100.0000 mg | ORAL_CAPSULE | Freq: Three times a day (TID) | ORAL | 0 refills | Status: DC

## 2017-10-22 MED ORDER — IBUPROFEN 800 MG PO TABS: 800.0000 mg | ORAL_TABLET | Freq: Three times a day (TID) | ORAL | 0 refills | Status: DC

## 2017-10-22 NOTE — Discharge Instructions (Addendum)
You were seen in the emergency today for upper respiratory symptoms.  Your chest x-ray did not show evidence of pneumonia.  We suspect your symptoms are related to a virus or allergies.  I have prescribed you multiple medications to treat your symptoms.   -Flonase to be used 1 spray in each nostril daily.  This medication is used to treat your congestion.  -Tessalon can be taken once every 8 hours as needed.  This medication is used to treat your cough.  -Ibuprofen to be taken once every 8 hours as needed for pain.  Be sure to take this with food as it can cause stomach upset and it were stomach bleeding.  Do not take other NSAIDs with this medication (Motrin, Advil, Aleve, Mobic, naproxen, ATC)  We have prescribed you new medication(s) today. Discuss the medications prescribed today with your pharmacist as they can have adverse effects and interactions with your other medicines including over the counter and prescribed medications. Seek medical evaluation if you start to experience new or abnormal symptoms after taking one of these medicines, seek care immediately if you start to experience difficulty breathing, feeling of your throat closing, facial swelling, or rash as these could be indications of a more serious allergic reaction   You will need to follow-up with your primary care provider in 1 week if your symptoms have not improved.  If you do not have a primary care provider one is provided in your discharge instructions.  Return to the emergency department for any new or worsening symptoms including but not limited to persistent fever, difficulty breathing, chest pain, or passing out, or any other concerns.   Additionally have your blood pressure rechecked by your primary care provider as it was elevated in the emergency department today. Vitals:   10/22/17 1222  BP: (!) 133/98  Pulse: 82  Resp: 16  Temp: 98.3 F (36.8 C)  SpO2: 100%

## 2017-10-22 NOTE — ED Provider Notes (Signed)
[facility]-EMERGENCY DEPT Provider Note   CSN: [id number] Arrival date & time: 10/22/17  1214     History   Chief Complaint Chief Complaint  Patient presents with  . Cough  . Nasal Congestion  . Sore Throat    HPI Cassandra Hill is a 22 y.o. female with a hx of migraines who presents to the ED with complaints of URI sxs x 4 days.  Patient states she first developed congestion with ear pressure, subsequently developed a productive cough with yellow mucus sputum, subjective fevers, and chills.  She states that her throat is sore from coughing so much.  Rates her overall discomfort at 8 out of 10 in severity.  She has tried cough drops with some improvement, no other specific alleviating or aggravating factors.  She states that there was a small child but sneezed in her face at the grocery store shortly prior to onset of her symptoms.  Denies difficulty breathing, chest pain, vomiting, or change in her voice.  Denies recent tic exposure, pets at home that could possibly bring tics in the house, or outdoor activity.   HPI  Past Medical History:  Diagnosis Date  . Arthritis   . Back pain   . Migraine     There are no active problems to display for this patient.   Past Surgical History:  Procedure Laterality Date  . INDUCED ABORTION    . tubes in ears       OB History   None      Home Medications    Prior to Admission medications   Medication Sig Start Date End Date Taking? Authorizing Provider  ciprofloxacin (CIPRO) 500 MG tablet Take 1 tablet (500 mg total) by mouth every 12 (twelve) hours. 10/07/17   Janne Napoleon, NP  diphenhydrAMINE (BENADRYL) 25 MG tablet Take 25 mg by mouth daily as needed for allergies.    [provider]  ferrous sulfate 325 (65 FE) MG tablet Take 325 mg by mouth daily with breakfast.    [provider]  ibuprofen (ADVIL,MOTRIN) 200 MG tablet Take 600 mg by mouth every 6 (six) hours as needed for  moderate pain.    [provider]  phenazopyridine (PYRIDIUM) 200 MG tablet Take 1 tablet (200 mg total) by mouth 3 (three) times daily. 10/07/17   Janne Napoleon, NP    Family History Family History  Problem Relation Age of Onset  . Diabetes Mother   . Hypertension Mother   . Hypertension Father     Social History Social History   Tobacco Use  . Smoking status: Never Smoker  . Smokeless tobacco: Never Used  Substance Use Topics  . Alcohol use: Yes    Comment: "Every once a while"   . Drug use: Yes    Types: Marijuana    Comment: Last use: 2 weeks ago     Allergies   Patient has no known allergies.   Review of Systems Review of Systems  Constitutional: Positive for chills and fever (subjective).  HENT: Positive for congestion, ear pain (pressure), rhinorrhea, sneezing and sore throat. Negative for drooling, trouble swallowing and voice change.   Respiratory: Positive for cough. Negative for shortness of breath.   Cardiovascular: Negative for chest pain and leg swelling.  Gastrointestinal: Negative for vomiting.  Skin: Negative for rash.     Physical Exam Updated Vital Signs BP (!) 133/98 (BP Location: Left Arm)   Pulse 82   Temp 98.3 F (36.8  C) (Oral)   Resp 16   SpO2 100%   Physical Exam  Constitutional: She appears well-developed and well-nourished.  Non-toxic appearance. No distress.  HENT:  Head: Normocephalic and atraumatic.  Right Ear: No drainage or swelling. No mastoid tenderness. Tympanic membrane is not perforated, not erythematous, not retracted and not bulging.  Left Ear: No drainage or swelling. No mastoid tenderness. Tympanic membrane is not perforated, not erythematous, not retracted and not bulging.  Nose: Mucosal edema present. Right sinus exhibits no maxillary sinus tenderness and no frontal sinus tenderness. Left sinus exhibits no maxillary sinus tenderness and no frontal sinus tenderness.  Mouth/Throat: Uvula is midline. Posterior  oropharyngeal erythema (mild) present. No oropharyngeal exudate or posterior oropharyngeal edema.  Patient is tolerating her own secretions without difficulty.  No trismus.  No drooling.  No hot potato voice.  Submandibular compartment is soft.  Eyes: Pupils are equal, round, and reactive to light. Conjunctivae are normal. Right eye exhibits no discharge. Left eye exhibits no discharge.  Neck: Normal range of motion. Neck supple. No neck rigidity.  Cardiovascular: Normal rate and regular rhythm.  No murmur heard. Pulmonary/Chest: Effort normal and breath sounds normal. No respiratory distress. She has no wheezes. She has no rhonchi. She has no rales.  Abdominal: Soft. She exhibits no distension. There is no tenderness.  Lymphadenopathy:    She has no cervical adenopathy.  Neurological: She is alert.  Skin: Skin is warm and dry. No rash noted.  Psychiatric: She has a normal mood and affect. Her behavior is normal.  Nursing note and vitals reviewed.   ED Treatments / Results  Labs (all labs ordered are listed, but only abnormal results are displayed) Labs Reviewed - No data to display  EKG None  Radiology No results found.  Procedures Procedures (including critical care time)  Medications Ordered in ED Medications - No data to display   Initial Impression / Assessment and Plan / ED Course  I have reviewed the triage vital signs and the nursing notes.  Pertinent labs & imaging results that were available during my care of the patient were reviewed by me and considered in my medical decision making (see chart for details).   Patient presents with complaints of URI sxs. Patient nontoxic appearing, in no apparent distress, vitals WNL other than elevated BP, doubt HTN emergency, patient aware of need for recheck. Patient's lungs CTA bilaterally, afebrile in the ER, no respiratory distress, CXR negative for infiltrate, doubt pneumonia. No wheezing on exam. Centor score 0- doubt strep  pharyngitis. Sxs < 7 days, afebrile, no sinus tenderness, doubt acute bacterial sinusitis. No meningeal signs. No evidence of AOM/EOM/mastoiditis on exam. No hx of tic exposures/pets/outdoor activity to raise suspicion for tic borne illness. Suspect viral vs. Allergic etiology at this time. Will treat symptomatically/supportively with flonase, tessalon, and ibuprofen. I discussed results, treatment plan, need for PCP follow-up, and return precautions with the patient. Provided opportunity for questions, patient confirmed understanding and is in agreement with plan.    Final Clinical Impressions(s) / ED Diagnoses   Final diagnoses:  URI with cough and congestion    ED Discharge Orders        Ordered    ibuprofen (ADVIL,MOTRIN) 800 MG tablet  3 times daily     10/22/17 1336    fluticasone (FLONASE) 50 MCG/ACT nasal spray  Daily     10/22/17 1336    benzonatate (TESSALON) 100 MG capsule  Every 8 hours     10/22/17 1336  Cherly Andersonetrucelli, Tyrann Donaho R, PA-C 10/22/17 1403    Derwood KaplanNanavati, Ankit, MD 10/23/17 1301

## 2017-10-22 NOTE — ED Triage Notes (Signed)
Sore throat started on the 8th along with cough and chills. States it's gotten worse since then.

## 2017-12-16 ENCOUNTER — Encounter (HOSPITAL_COMMUNITY): Payer: Self-pay

## 2017-12-16 ENCOUNTER — Emergency Department (HOSPITAL_COMMUNITY)
Admission: EM | Admit: 2017-12-16 | Discharge: 2017-12-16 | Disposition: A | Discharge status: Home / Self Care | Payer: Medicaid Other | Attending: Emergency Medicine | Admitting: Emergency Medicine

## 2017-12-16 ENCOUNTER — Other Ambulatory Visit: Payer: Self-pay

## 2017-12-16 ENCOUNTER — Emergency Department (HOSPITAL_COMMUNITY): Payer: Medicaid Other

## 2017-12-16 DIAGNOSIS — R300 Dysuria: Secondary | ICD-10-CM | POA: Diagnosis not present

## 2017-12-16 DIAGNOSIS — Z79899 Other long term (current) drug therapy: Secondary | ICD-10-CM | POA: Diagnosis not present

## 2017-12-16 DIAGNOSIS — R109 Unspecified abdominal pain: Secondary | ICD-10-CM | POA: Diagnosis present

## 2017-12-16 DIAGNOSIS — R102 Pelvic and perineal pain: Secondary | ICD-10-CM | POA: Diagnosis not present

## 2017-12-16 LAB — BASIC METABOLIC PANEL
Anion gap: 8 (ref 5–15)
BUN: 14 mg/dL (ref 6–20)
CO2: 30 mmol/L (ref 22–32)
Calcium: 9.7 mg/dL (ref 8.9–10.3)
Chloride: 102 mmol/L (ref 98–111)
Creatinine, Ser: 0.65 mg/dL (ref 0.44–1.00)
GFR calc Af Amer: >60 mL/min (ref >60)
GFR calc non Af Amer: >60 mL/min (ref >60)
Glucose, Bld: 99 mg/dL (ref 70–99)
Potassium: 4.1 mmol/L (ref 3.5–5.1)
Sodium: 140 mmol/L (ref 135–145)

## 2017-12-16 LAB — URINALYSIS, ROUTINE W REFLEX MICROSCOPIC
Bilirubin Urine: NEGATIVE
Glucose, UA: NEGATIVE mg/dL
Hgb urine dipstick: NEGATIVE
Ketones, ur: NEGATIVE mg/dL
Nitrite: NEGATIVE
Protein, ur: NEGATIVE mg/dL
Specific Gravity, Urine: 1.019 (ref 1.005–1.030)
pH: 7 (ref 5.0–8.0)

## 2017-12-16 LAB — CBC WITH DIFFERENTIAL/PLATELET
Basophils Absolute: 0 10*3/uL (ref 0.0–0.1)
Basophils Relative: 0 %
Eosinophils Absolute: 0.1 10*3/uL (ref 0.0–0.7)
Eosinophils Relative: 1 %
HCT: 42.4 % (ref 36.0–46.0)
Hemoglobin: 14.4 g/dL (ref 12.0–15.0)
Lymphocytes Relative: 21 %
Lymphs Abs: 2.1 10*3/uL (ref 0.7–4.0)
MCH: 28.1 pg (ref 26.0–34.0)
MCHC: 34 g/dL (ref 30.0–36.0)
MCV: 82.8 fL (ref 78.0–100.0)
Monocytes Absolute: 0.8 10*3/uL (ref 0.1–1.0)
Monocytes Relative: 8 %
Neutro Abs: 7.1 10*3/uL (ref 1.7–7.7)
Neutrophils Relative %: 70 %
Platelets: 453 10*3/uL — ABNORMAL HIGH (ref 150–400)
RBC: 5.12 MIL/uL — ABNORMAL HIGH (ref 3.87–5.11)
RDW: 12.2 % (ref 11.5–15.5)
WBC: 10.1 10*3/uL (ref 4.0–10.5)

## 2017-12-16 LAB — WET PREP, GENITAL
Clue Cells Wet Prep HPF POC: NONE SEEN
Sperm: NONE SEEN
Trich, Wet Prep: NONE SEEN
Yeast Wet Prep HPF POC: NONE SEEN

## 2017-12-16 LAB — POC URINE PREG, ED: Preg Test, Ur: NEGATIVE

## 2017-12-16 MED ORDER — IBUPROFEN 800 MG PO TABS: 800.0000 mg | ORAL_TABLET | Freq: Three times a day (TID) | ORAL | 0 refills | Status: DC | PRN

## 2017-12-16 NOTE — ED Triage Notes (Signed)
Patient c/o lower abdominal pain x 3 days. Patient denies any vaginal discharge,but c/o urinary frequency and dysuria.  Patient states that she had an unsual period in July. Very light in nature.

## 2017-12-16 NOTE — Discharge Instructions (Signed)
Return here as needed. Follow up with the GYN clinic provided.  °

## 2017-12-17 LAB — GC/CHLAMYDIA PROBE AMP (~~LOC~~) NOT AT ARMC
Chlamydia: NEGATIVE
Neisseria Gonorrhea: NEGATIVE

## 2017-12-17 LAB — CERVICOVAGINAL ANCILLARY ONLY
Chlamydia: NEGATIVE
Neisseria Gonorrhea: NEGATIVE

## 2017-12-21 NOTE — ED Provider Notes (Signed)
[facility]-EMERGENCY DEPT Provider Note   CSN: [id number] Arrival date & time: 12/16/17  1014     History   Chief Complaint Chief Complaint  Patient presents with  . Abdominal Pain  . Dysuria    HPI Cassandra Hill is a 22 y.o. female.  HPI Patient presents to the emergency department with back pain that is mostly around her menstrual cycles.  Patient states that at this time she is not having significant pain but recently came off of her menstrual cycle where she was having increasing pelvic pain.  The patient states she has had STDs in the past.  The patient states she has no vaginal discharge at this time.  She states she does have vaginal discomfort as well.  She states that she is not having any urinary frequency or urgency.  Patient states that her period in July was more like than normal.  The patient denies chest pain, shortness of breath, headache,blurred vision, neck pain, fever, cough, weakness, numbness, dizziness, anorexia, edema,nausea, vomiting, diarrhea, rash, back pain, dysuria, hematemesis, bloody stool, near syncope, or syncope. Past Medical History:  Diagnosis Date  . Arthritis   . Back pain   . Migraine     There are no active problems to display for this patient.   Past Surgical History:  Procedure Laterality Date  . INDUCED ABORTION    . tubes in ears       OB History   None      Home Medications    Prior to Admission medications   Medication Sig Start Date End Date Taking? Authorizing Provider  diphenhydrAMINE (BENADRYL) 25 MG tablet Take 25 mg by mouth daily as needed for allergies.   Yes [provider]  ferrous sulfate 325 (65 FE) MG tablet Take 325 mg by mouth daily with breakfast.   Yes [provider]  fluticasone (FLONASE) 50 MCG/ACT nasal spray Place 1 spray into both nostrils daily. 10/22/17  Yes Petrucelli, Samantha R, PA-C  benzonatate (TESSALON) 100 MG capsule Take 1 capsule (100 mg total)  by mouth every 8 (eight) hours. Patient not taking: Reported on 12/16/2017 10/22/17   Petrucelli, Pleas KochSamantha R, PA-C  ciprofloxacin (CIPRO) 500 MG tablet Take 1 tablet (500 mg total) by mouth every 12 (twelve) hours. Patient not taking: Reported on 12/16/2017 10/07/17   Janne NapoleonNeese, Hope M, NP  ibuprofen (ADVIL,MOTRIN) 800 MG tablet Take 1 tablet (800 mg total) by mouth every 8 (eight) hours as needed. 12/16/17   Kym Scannell, Cristal Deerhristopher, PA-C  phenazopyridine (PYRIDIUM) 200 MG tablet Take 1 tablet (200 mg total) by mouth 3 (three) times daily. Patient not taking: Reported on 12/16/2017 10/07/17   Janne NapoleonNeese, Hope M, NP    Family History Family History  Problem Relation Age of Onset  . Diabetes Mother   . Hypertension Mother   . Hypertension Father     Social History Social History   Tobacco Use  . Smoking status: Never Smoker  . Smokeless tobacco: Never Used  Substance Use Topics  . Alcohol use: Yes    Comment: "Every once a while"   . Drug use: Yes    Types: Marijuana    Comment: Last use: 2 weeks ago     Allergies   Patient has no known allergies.   Review of Systems Review of Systems All other systems negative except as documented in the HPI. All pertinent positives and negatives as reviewed in the HPI.  Physical Exam Updated Vital Signs BP (!) 112/92 (  BP Location: Left Arm)   Pulse 65   Temp 98.6 F (37 C) (Oral)   Resp 18   Ht 5\' 2"  (1.575 m)   Wt 65.3 kg   LMP 12/04/2017   SpO2 100%   BMI 26.34 kg/m   Physical Exam  Constitutional: She is oriented to person, place, and time. She appears well-developed and well-nourished. No distress.  HENT:  Head: Normocephalic and atraumatic.  Mouth/Throat: Oropharynx is clear and moist.  Eyes: Pupils are equal, round, and reactive to light.  Neck: Normal range of motion. Neck supple.  Cardiovascular: Normal rate, regular rhythm and normal heart sounds. Exam reveals no gallop and no friction rub.  No murmur heard. Pulmonary/Chest: Effort  normal and breath sounds normal. No respiratory distress. She has no wheezes.  Abdominal: Soft. Bowel sounds are normal. She exhibits no distension. There is tenderness.  Genitourinary: Cervix exhibits no motion tenderness, no discharge and no friability. Right adnexum displays no mass and no tenderness. Left adnexum displays no mass and no tenderness. No erythema, tenderness or bleeding in the vagina. No foreign body in the vagina. No signs of injury around the vagina. No vaginal discharge found.  Neurological: She is alert and oriented to person, place, and time. She exhibits normal muscle tone. Coordination normal.  Skin: Skin is warm and dry. Capillary refill takes less than 2 seconds. No rash noted. No erythema.  Psychiatric: She has a normal mood and affect. Her behavior is normal.  Nursing note and vitals reviewed.    ED Treatments / Results  Labs (all labs ordered are listed, but only abnormal results are displayed) Labs Reviewed  WET PREP, GENITAL - Abnormal; Notable for the following components:      Result Value   WBC, Wet Prep HPF POC FEW (*)    All other components within normal limits  URINALYSIS, ROUTINE W REFLEX MICROSCOPIC - Abnormal; Notable for the following components:   Leukocytes, UA TRACE (*)    Bacteria, UA RARE (*)    All other components within normal limits  CBC WITH DIFFERENTIAL/PLATELET - Abnormal; Notable for the following components:   RBC 5.12 (*)    Platelets 453 (*)    All other components within normal limits  BASIC METABOLIC PANEL  POC URINE PREG, ED  GC/CHLAMYDIA PROBE AMP ([place]) NOT AT [facility]  CERVICOVAGINAL ANCILLARY ONLY    EKG None  Radiology No results found.  Procedures Procedures (including critical care time)  Medications Ordered in ED Medications - No data to display   Initial Impression / Assessment and Plan / ED Course  I have reviewed the triage vital signs and the nursing notes.  Pertinent labs & imaging results  that were available during my care of the patient were reviewed by me and considered in my medical decision making (see chart for details).     Patient is advised he will need to follow-up with GYN for further evaluation and care of the increased pelvic pain around her cycles.  The ultrasound did not show any significant abnormality that could be causing her pain.  Patient is advised to return for any worsening in her condition. Final Clinical Impressions(s) / ED Diagnoses   Final diagnoses:  Pelvic pain    ED Discharge Orders         Ordered    ibuprofen (ADVIL,MOTRIN) 800 MG tablet  Every 8 hours PRN     12/16/17 1536           Jamaria Amborn, [place],  PA-C 12/21/17 1558    Raeford Razor, MD 12/31/17 [phone]

## 2018-01-28 ENCOUNTER — Other Ambulatory Visit: Payer: Self-pay

## 2018-01-28 ENCOUNTER — Emergency Department (HOSPITAL_COMMUNITY)
Admission: EM | Admit: 2018-01-28 | Discharge: 2018-01-28 | Disposition: A | Discharge status: Home / Self Care | Payer: Medicaid Other | Attending: Emergency Medicine | Admitting: Emergency Medicine

## 2018-01-28 DIAGNOSIS — L02211 Cutaneous abscess of abdominal wall: Secondary | ICD-10-CM | POA: Diagnosis not present

## 2018-01-28 DIAGNOSIS — F1290 Cannabis use, unspecified, uncomplicated: Secondary | ICD-10-CM | POA: Insufficient documentation

## 2018-01-28 MED ORDER — CEPHALEXIN 500 MG PO CAPS: 500.0000 mg | ORAL_CAPSULE | Freq: Four times a day (QID) | ORAL | 0 refills | Status: DC

## 2018-01-28 MED ORDER — LIDOCAINE-EPINEPHRINE (PF) 2 %-1:200000 IJ SOLN
10.0000 mL | Freq: Once | INTRAMUSCULAR | Status: AC
  Administered 2018-01-28: 10 mL
  Filled 2018-01-28: qty 20

## 2018-01-28 MED ORDER — CEPHALEXIN 500 MG PO CAPS
500.0000 mg | ORAL_CAPSULE | Freq: Once | ORAL | Status: AC
  Administered 2018-01-28: 500 mg via ORAL
  Filled 2018-01-28: qty 1

## 2018-01-28 NOTE — Discharge Instructions (Signed)
Return to ED for worsening symptoms, worsening redness around the site, developing fever, recurrence of abscess.

## 2018-01-28 NOTE — ED Triage Notes (Signed)
Patient arrives with c/o bump 3-4 days ago, redness noted around wound. Denies fevers.

## 2018-01-28 NOTE — ED Provider Notes (Signed)
[facility]-EMERGENCY DEPT Provider Note   CSN: [id number] Arrival date & time: 01/28/18  1054     History   Chief Complaint Chief Complaint  Patient presents with  . Abscess    HPI Cassandra Hill is a 22 y.o. female who presents to ED for evaluation of 3-day history of abscess noticed in her right middle abdomen.  Reports history of similar symptoms in the past about 4 months ago on the left side of her abdomen which was incised and drained successfully.  She tried to lance the area, use warm compresses and warm showers with no improvement in her symptoms.  Denies any fevers, chills or prior history of similar symptoms other than 4 months ago.  Denies any possible trauma or insect bite to the area.  HPI  Past Medical History:  Diagnosis Date  . Arthritis   . Back pain   . Migraine     There are no active problems to display for this patient.   Past Surgical History:  Procedure Laterality Date  . INDUCED ABORTION    . tubes in ears       OB History   None      Home Medications    Prior to Admission medications   Medication Sig Start Date End Date Taking? Authorizing Provider  benzonatate (TESSALON) 100 MG capsule Take 1 capsule (100 mg total) by mouth every 8 (eight) hours. Patient not taking: Reported on 12/16/2017 10/22/17   Petrucelli, Pleas Koch, PA-C  cephALEXin (KEFLEX) 500 MG capsule Take 1 capsule (500 mg total) by mouth 4 (four) times daily. 01/28/18   Jarriel Papillion, PA-C  ciprofloxacin (CIPRO) 500 MG tablet Take 1 tablet (500 mg total) by mouth every 12 (twelve) hours. Patient not taking: Reported on 12/16/2017 10/07/17   Janne Napoleon, NP  diphenhydrAMINE (BENADRYL) 25 MG tablet Take 25 mg by mouth daily as needed for allergies.    [provider]  ferrous sulfate 325 (65 FE) MG tablet Take 325 mg by mouth daily with breakfast.    [provider]  fluticasone (FLONASE) 50 MCG/ACT nasal spray Place 1 spray into both  nostrils daily. 10/22/17   Petrucelli, Samantha R, PA-C  ibuprofen (ADVIL,MOTRIN) 800 MG tablet Take 1 tablet (800 mg total) by mouth every 8 (eight) hours as needed. 12/16/17   Lawyer, Cristal Deer, PA-C  phenazopyridine (PYRIDIUM) 200 MG tablet Take 1 tablet (200 mg total) by mouth 3 (three) times daily. Patient not taking: Reported on 12/16/2017 10/07/17   Janne Napoleon, NP    Family History Family History  Problem Relation Age of Onset  . Diabetes Mother   . Hypertension Mother   . Hypertension Father     Social History Social History   Tobacco Use  . Smoking status: Never Smoker  . Smokeless tobacco: Never Used  Substance Use Topics  . Alcohol use: Yes    Comment: "Every once a while"   . Drug use: Yes    Types: Marijuana    Comment: Last use: 2 weeks ago     Allergies   Patient has no known allergies.   Review of Systems Review of Systems  Constitutional: Negative for chills and fever.  Gastrointestinal: Negative for vomiting.  Skin: Positive for wound.     Physical Exam Updated Vital Signs BP 135/80 (BP Location: Right Arm)   Pulse 76   Temp 98.6 F (37 C) (Oral)   Resp 16   Ht 5\' 2"  (1.575 m)  Wt 67.6 kg   LMP 01/28/2018   SpO2 99%   BMI 27.25 kg/m   Physical Exam  Constitutional: She appears well-developed and well-nourished. No distress.  HENT:  Head: Normocephalic and atraumatic.  Eyes: Conjunctivae and EOM are normal. No scleral icterus.  Neck: Normal range of motion.  Pulmonary/Chest: Effort normal. No respiratory distress.  Neurological: She is alert.  Skin: No rash noted. She is not diaphoretic. There is erythema.  2 x 2 centimeter area of redness, induration and central fluctuance noted on right middle abdomen.  Psychiatric: She has a normal mood and affect.  Nursing note and vitals reviewed.    ED Treatments / Results  Labs (all labs ordered are listed, but only abnormal results are displayed) Labs Reviewed - No data to  display  EKG None  Radiology No results found.   EMERGENCY DEPARTMENT US SOFT TISSUE INTERPRETATION "Study: Limited Soft Tissue Ultrasound"  INDICATIONS: Soft tissue infection Multiple views of the body part were obtained in real-time with a multi-frequency linear probe  PERFORMED BY: Myself IMAGES ARCHIVED?: No SIDE:Right  BODY PART:Abdominal wall INTERPRETATION:  Abcess present and Cellulitis present  Procedures .Marland Kitchen.Incision and Drainage Date/Time: 01/28/2018 1:14 PM Performed by: Dietrich PatesKhatri, Pantera [place], PA-C Authorized by: Dietrich PatesKhatri, Tamma Brigandi, PA-C   Consent:    Consent obtained:  Verbal   Consent given by:  Patient   Risks discussed:  Bleeding, damage to other organs, incomplete drainage, infection and pain Location:    Type:  Abscess   Location:  Trunk   Trunk location:  Abdomen Pre-procedure details:    Skin preparation:  Betadine Anesthesia (see MAR for exact dosages):    Anesthesia method:  Local infiltration   Local anesthetic:  Lidocaine 2% WITH epi Procedure details:    Needle aspiration: no     Incision types:  Stab incision   Scalpel blade:  11   Wound management:  Probed and deloculated and irrigated with saline   Drainage:  Purulent and bloody   Drainage amount:  Scant   Wound treatment:  Wound left open Post-procedure details:    Patient tolerance of procedure:  Tolerated well, no immediate complications   (including critical care time)  Medications Ordered in ED Medications  lidocaine-EPINEPHrine (XYLOCAINE W/EPI) 2 %-1:200000 (PF) injection 10 mL (has no administration in time range)  cephALEXin (KEFLEX) capsule 500 mg (has no administration in time range)     Initial Impression / Assessment and Plan / ED Course  I have reviewed the triage vital signs and the nursing notes.  Pertinent labs & imaging results that were available during my care of the patient were reviewed by me and considered in my medical decision making (see chart for details).       Patient with skin abscess. Incision and drainage performed in the ED today with purulent drainage noted.  Abscess was not large enough to warrant packing or drain placement. No signs of systemic illness present. Advised patient to return for wound recheck in 2 days, sooner if signs of infection or increased bleeding/drainage noted. Supportive care and return precautions discussed.  Pt sent home with Keflex. The patient appears reasonably screened and/or stabilized for discharge. Strict return precautions given.  Portions of this note were generated with Scientist, clinical (histocompatibility and immunogenetics)Dragon dictation software. Dictation errors may occur despite best attempts at proofreading.  Final Clinical Impressions(s) / ED Diagnoses   Final diagnoses:  Abscess of abdominal wall    ED Discharge Orders         Ordered  cephALEXin (KEFLEX) 500 MG capsule  4 times daily     01/28/18 1314           Dietrich Pates, PA-C 01/28/18 1315    Little, Ambrose Finland, MD 01/28/18 [phone]

## 2018-01-28 NOTE — ED Notes (Signed)
Patient verbalized understanding of discharge instructions, no questions. Patient ambulated out of ED with steady gait in no distress.  

## 2018-01-30 ENCOUNTER — Ambulatory Visit (HOSPITAL_COMMUNITY)
Admission: EM | Admit: 2018-01-30 | Discharge: 2018-01-30 | Disposition: A | Discharge status: Home / Self Care | Payer: Medicaid Other | Attending: Internal Medicine | Admitting: Internal Medicine

## 2018-01-30 ENCOUNTER — Encounter (HOSPITAL_COMMUNITY): Payer: Self-pay | Admitting: Emergency Medicine

## 2018-01-30 DIAGNOSIS — L02211 Cutaneous abscess of abdominal wall: Secondary | ICD-10-CM

## 2018-01-30 MED ORDER — SULFAMETHOXAZOLE-TRIMETHOPRIM 800-160 MG PO TABS: 1.0000 | ORAL_TABLET | Freq: Two times a day (BID) | ORAL | 0 refills | Status: AC

## 2018-01-30 NOTE — ED Triage Notes (Signed)
Pt c/o abscess on her R side of her abdomen, pt states she went to the ER two days ago and they lanced it and its now bigger. Has been taking antibiotics for one day.

## 2018-01-30 NOTE — Discharge Instructions (Signed)
Complete keflex, begin bactrim twice daily for 1 week  Apply warm compresses/hot rags to area with massage to express further drainage especially the first 24-48 hours  Return if symptoms returning or not improving

## 2018-01-30 NOTE — ED Provider Notes (Signed)
[facility]    CSN: [id number] Arrival date & time: 01/30/18  9629     History   Chief Complaint Chief Complaint  Patient presents with  . Abscess    HPI Cassandra Hill is a 22 y.o. female no significant past medical presenting today for evaluation of an abscess. Patient began to have the abscess 4 days ago and has increased in size and pain. Located on right upper abdomen. Seen in ED 2 days ago and had it drained with minimal drainage. Taking keflex, symptoms worsening since. Previous abscess on left side of abdomen. Denies fevers, nausea, vomiting.  She has been applying warm compresses, but feels as if this worsens her discomfort.  HPI  Past Medical History:  Diagnosis Date  . Arthritis   . Back pain   . Migraine     There are no active problems to display for this patient.   Past Surgical History:  Procedure Laterality Date  . INDUCED ABORTION    . tubes in ears      OB History   None      Home Medications    Prior to Admission medications   Medication Sig Start Date End Date Taking? Authorizing Provider  cephALEXin (KEFLEX) 500 MG capsule Take 1 capsule (500 mg total) by mouth 4 (four) times daily. 01/28/18   Khatri, Hina, PA-C  diphenhydrAMINE (BENADRYL) 25 MG tablet Take 25 mg by mouth daily as needed for allergies.    [provider]  ferrous sulfate 325 (65 FE) MG tablet Take 325 mg by mouth daily with breakfast.    [provider]  fluticasone (FLONASE) 50 MCG/ACT nasal spray Place 1 spray into both nostrils daily. 10/22/17   Petrucelli, Samantha R, PA-C  ibuprofen (ADVIL,MOTRIN) 800 MG tablet Take 1 tablet (800 mg total) by mouth every 8 (eight) hours as needed. 12/16/17   Lawyer, Cristal Deer, PA-C  sulfamethoxazole-trimethoprim (BACTRIM DS,SEPTRA DS) 800-160 MG tablet Take 1 tablet by mouth 2 (two) times daily for 7 days. 01/30/18 02/06/18  Wieters, Junius Creamer, PA-C    Family History Family History  Problem Relation Age of  Onset  . Diabetes Mother   . Hypertension Mother   . Hypertension Father     Social History Social History   Tobacco Use  . Smoking status: Never Smoker  . Smokeless tobacco: Never Used  Substance Use Topics  . Alcohol use: Yes    Comment: "Every once a while"   . Drug use: Yes    Types: Marijuana    Comment: Last use: 2 weeks ago     Allergies   Patient has no known allergies.   Review of Systems Review of Systems  Constitutional: Negative for fatigue and fever.  HENT: Negative for mouth sores.   Eyes: Negative for visual disturbance.  Respiratory: Negative for shortness of breath.   Cardiovascular: Negative for chest pain.  Gastrointestinal: Negative for abdominal pain, nausea and vomiting.  Genitourinary: Negative for genital sores.  Musculoskeletal: Negative for arthralgias and joint swelling.  Skin: Positive for color change. Negative for rash and wound.  Neurological: Negative for dizziness, weakness, light-headedness and headaches.     Physical Exam Triage Vital Signs ED Triage Vitals [01/30/18 1008]  Enc Vitals Group     BP 124/90     Pulse Rate 79     Resp 16     Temp 98.6 F (37 C)     Temp src      SpO2 100 %  Weight      Height      Head Circumference      Peak Flow      Pain Score      Pain Loc      Pain Edu?      Excl. in GC?    No data found.  Updated Vital Signs BP 124/90   Pulse 79   Temp 98.6 F (37 C)   Resp 16   LMP 01/28/2018   SpO2 100%   Visual Acuity Right Eye Distance:   Left Eye Distance:   Bilateral Distance:    Right Eye Near:   Left Eye Near:    Bilateral Near:     Physical Exam  Constitutional: She is oriented to person, place, and time. She appears well-developed and well-nourished.  No acute distress  HENT:  Head: Normocephalic and atraumatic.  Nose: Nose normal.  Eyes: Conjunctivae are normal.  Neck: Neck supple.  Cardiovascular: Normal rate.  Pulmonary/Chest: Effort normal. No respiratory  distress.  Abdominal: She exhibits no distension.  2 cm x 3 cm oval indurated area with overlying erythema and warmth, central punctate area from previous I&D  Musculoskeletal: Normal range of motion.  Neurological: She is alert and oriented to person, place, and time.  Skin: Skin is warm and dry.  Psychiatric: She has a normal mood and affect.  Nursing note and vitals reviewed.    UC Treatments / Results  Labs (all labs ordered are listed, but only abnormal results are displayed) Labs Reviewed - No data to display  EKG None  Radiology No results found.  Procedures Incision and Drainage Date/Time: 01/30/2018 11:49 AM Performed by: Wieters, Junius CreamerHallie C, PA-C Authorized by: Isa RankinMurray, Laura Wilson, MD   Consent:    Consent obtained:  Verbal   Consent given by:  Patient   Risks discussed:  Incomplete drainage, pain and damage to other organs   Alternatives discussed:  No treatment Location:    Type:  Abscess   Size:  3   Location:  Trunk   Trunk location:  Abdomen Pre-procedure details:    Skin preparation:  Betadine Anesthesia (see MAR for exact dosages):    Anesthesia method:  Local infiltration   Local anesthetic:  Lidocaine 2% WITH epi Procedure type:    Complexity:  Simple Procedure details:    Needle aspiration: no     Incision types:  Single straight   Incision depth:  Subcutaneous   Scalpel blade:  11   Wound management:  Probed and deloculated   Drainage:  Bloody and purulent   Drainage amount:  Moderate   Wound treatment:  Wound left open   Packing materials:  None Post-procedure details:    Patient tolerance of procedure:  Tolerated well, no immediate complications   (including critical care time)  Medications Ordered in UC Medications - No data to display  Initial Impression / Assessment and Plan / UC Course  I have reviewed the triage vital signs and the nursing notes.  Pertinent labs & imaging results that were available during my care of the  patient were reviewed by me and considered in my medical decision making (see chart for details).     I&D performed,.  Drainage obtained, will add in Bactrim on top of patient's Keflex, discussed importance of warm compresses.  Continue to monitor.  Follow-up if not continuing to heal.Discussed strict return precautions. Patient verbalized understanding and is agreeable with plan.  Final Clinical Impressions(s) / UC Diagnoses   Final  diagnoses:  Abscess of abdominal wall     Discharge Instructions     Complete keflex, begin bactrim twice daily for 1 week  Apply warm compresses/hot rags to area with massage to express further drainage especially the first 24-48 hours  Return if symptoms returning or not improving    ED Prescriptions    Medication Sig Dispense Auth. Provider   sulfamethoxazole-trimethoprim (BACTRIM DS,SEPTRA DS) 800-160 MG tablet Take 1 tablet by mouth 2 (two) times daily for 7 days. 14 tablet Wieters, [place] C, PA-C     Controlled Substance Prescriptions Home Controlled Substance Registry consulted? Not Applicable   Lew Dawes, [place] 01/30/18 1150

## 2018-04-17 ENCOUNTER — Encounter (HOSPITAL_COMMUNITY): Payer: Self-pay

## 2018-04-17 ENCOUNTER — Emergency Department (HOSPITAL_COMMUNITY)
Admission: EM | Admit: 2018-04-17 | Discharge: 2018-04-17 | Disposition: A | Discharge status: Home / Self Care | Payer: Medicaid Other | Attending: Emergency Medicine | Admitting: Emergency Medicine

## 2018-04-17 ENCOUNTER — Other Ambulatory Visit: Payer: Self-pay

## 2018-04-17 DIAGNOSIS — W540XXA Bitten by dog, initial encounter: Secondary | ICD-10-CM | POA: Insufficient documentation

## 2018-04-17 DIAGNOSIS — Y939 Activity, unspecified: Secondary | ICD-10-CM | POA: Insufficient documentation

## 2018-04-17 DIAGNOSIS — S00511A Abrasion of lip, initial encounter: Secondary | ICD-10-CM | POA: Diagnosis not present

## 2018-04-17 DIAGNOSIS — S50812A Abrasion of left forearm, initial encounter: Secondary | ICD-10-CM | POA: Diagnosis not present

## 2018-04-17 DIAGNOSIS — Y929 Unspecified place or not applicable: Secondary | ICD-10-CM | POA: Insufficient documentation

## 2018-04-17 DIAGNOSIS — Z79899 Other long term (current) drug therapy: Secondary | ICD-10-CM | POA: Diagnosis not present

## 2018-04-17 DIAGNOSIS — Z23 Encounter for immunization: Secondary | ICD-10-CM | POA: Diagnosis not present

## 2018-04-17 DIAGNOSIS — Y999 Unspecified external cause status: Secondary | ICD-10-CM | POA: Diagnosis not present

## 2018-04-17 DIAGNOSIS — S59912A Unspecified injury of left forearm, initial encounter: Secondary | ICD-10-CM | POA: Diagnosis present

## 2018-04-17 MED ORDER — AMOXICILLIN-POT CLAVULANATE 875-125 MG PO TABS: 1.0000 | ORAL_TABLET | Freq: Two times a day (BID) | ORAL | 0 refills | Status: DC

## 2018-04-17 MED ORDER — TETANUS-DIPHTH-ACELL PERTUSSIS 5-2.5-18.5 LF-MCG/0.5 IM SUSP
0.5000 mL | Freq: Once | INTRAMUSCULAR | Status: AC
  Administered 2018-04-17: 0.5 mL via INTRAMUSCULAR
  Filled 2018-04-17: qty 0.5

## 2018-04-17 NOTE — Discharge Instructions (Signed)
Take all Augmentin as prescribed.  Return to the ED immediately for new or worsening symptoms, such as increased redness, fevers, discharge, increased pain or swelling or any concerns at all.

## 2018-04-17 NOTE — ED Triage Notes (Signed)
Pt states 2 days ago pt's sisters dog bit her lower left arm and upper lip. Pt states that her sister "thinks" the dog has had all the shots.

## 2018-04-17 NOTE — ED Provider Notes (Signed)
[facility]-EMERGENCY DEPT Provider Note   CSN: [id number] Arrival date & time: 04/17/18  1654     History   Chief Complaint Chief Complaint  Patient presents with  . Animal Bite    HPI Cassandra Hill is a 22 y.o. female.  HPI 22 year old female presents today status post dog bite.  Patient states that is her sister's dog and she believes the dog is vaccinated.  Patient states she was playing with the dog when the dog turned and bit her on the left forearm and upper lip.  She states pain has improved since initial bite.  She denies any decreased range of motion, numbness, tingling.  She denies any increased redness, fevers, chills, swelling.  She denies any other injuries or trauma.   Past Medical History:  Diagnosis Date  . Arthritis   . Back pain   . Migraine     There are no active problems to display for this patient.   Past Surgical History:  Procedure Laterality Date  . INDUCED ABORTION    . tubes in ears       OB History   None      Home Medications    Prior to Admission medications   Medication Sig Start Date End Date Taking? Authorizing Provider  cephALEXin (KEFLEX) 500 MG capsule Take 1 capsule (500 mg total) by mouth 4 (four) times daily. 01/28/18   Khatri, Hina, PA-C  diphenhydrAMINE (BENADRYL) 25 MG tablet Take 25 mg by mouth daily as needed for allergies.    [provider]  ferrous sulfate 325 (65 FE) MG tablet Take 325 mg by mouth daily with breakfast.    [provider]  fluticasone (FLONASE) 50 MCG/ACT nasal spray Place 1 spray into both nostrils daily. 10/22/17   Petrucelli, Samantha R, PA-C  ibuprofen (ADVIL,MOTRIN) 800 MG tablet Take 1 tablet (800 mg total) by mouth every 8 (eight) hours as needed. 12/16/17   Charlestine Night, PA-C    Family History Family History  Problem Relation Age of Onset  . Diabetes Mother   . Hypertension Mother   . Hypertension Father     Social History Social  History   Tobacco Use  . Smoking status: Never Smoker  . Smokeless tobacco: Never Used  Substance Use Topics  . Alcohol use: Yes    Comment: "Every once a while"   . Drug use: Yes    Types: Marijuana    Comment: Last use: 2 weeks ago     Allergies   Patient has no known allergies.   Review of Systems Review of Systems  Constitutional: Negative for chills and fever.  Respiratory: Negative for shortness of breath.   Cardiovascular: Negative for chest pain.  Gastrointestinal: Negative for abdominal pain, nausea and vomiting.  Skin: Positive for wound (upper lip and left forearm).  Neurological: Negative for numbness.     Physical Exam Updated Vital Signs BP (!) 134/47 (BP Location: Right Arm)   Pulse 70   Temp 98.3 F (36.8 C) (Oral)   Resp 16   Ht 5\' 4"  (1.626 m)   Wt 59.9 kg   LMP 03/19/2018   SpO2 100%   BMI 22.66 kg/m   Physical Exam  Constitutional: She is oriented to person, place, and time. She appears well-developed and well-nourished.  HENT:  Head: Normocephalic and atraumatic.  Mouth/Throat: Uvula is midline and oropharynx is clear and moist.  Inside upper lip on the right side there is an abrasion, no lacerations,  no through and through injuries. No lesions on gums, lower lip, tongue  Eyes: Conjunctivae and EOM are normal.  Neck: Neck supple.  Cardiovascular: Normal rate, regular rhythm and normal heart sounds.  No murmur heard. Pulmonary/Chest: Effort normal and breath sounds normal. No respiratory distress. She has no wheezes. She has no rales.  Abdominal: Soft. Bowel sounds are normal. She exhibits no distension. There is no tenderness.  Musculoskeletal: Normal range of motion. She exhibits no tenderness or deformity.  Neurological: She is alert and oriented to person, place, and time.  Skin: Skin is warm and dry. No rash noted. No erythema.     Psychiatric: She has a normal mood and affect. Her behavior is normal.  Nursing note and vitals  reviewed.    ED Treatments / Results  Labs (all labs ordered are listed, but only abnormal results are displayed) Labs Reviewed - No data to display  EKG None  Radiology No results found.  Procedures Procedures (including critical care time)  Medications Ordered in ED Medications  Tdap (BOOSTRIX) injection 0.5 mL (has no administration in time range)     Initial Impression / Assessment and Plan / ED Course  I have reviewed the triage vital signs and the nursing notes.  Pertinent labs & imaging results that were available during my care of the patient were reviewed by me and considered in my medical decision making (see chart for details).     Patient resting comfortably in bed, no acute distress, nontoxic, non-lethargic.  Vital signs stable.  No lacerations or puncture wounds noted from animal bite.  However 2 abrasions noted.  No surrounding erythema or discharge, no ascending lymphangitis, no cellulitis.  Will start on prophylactic Augmentin for animal bite.  Patient is unsure when her last tetanus shot was.  Will update today.  Dog is known to the patient.  Discussed rabies vaccines versus animal control monitoring dog.  After discussion, patient is agreeable with animal control monitoring the dog and then determining need for rabies shots.  Abrasion emesis to left forearm noted however has full range of motion, no significant edema, she is neurovascularly intact distally.  Given strict return precautions.   At this time there does not appear to be any evidence of an acute emergency medical condition and the patient appears stable for discharge with appropriate outpatient follow up.Diagnosis was discussed with patient who verbalizes understanding and is agreeable to discharge.  Final Clinical Impressions(s) / ED Diagnoses   Final diagnoses:  None    ED Discharge Orders    None       Rueben BashKendrick, Cherylyn Sundby S, PA-C 04/17/18 2227    Loren RacerYelverton, David, MD 04/17/18 2333

## 2018-04-29 ENCOUNTER — Emergency Department (HOSPITAL_COMMUNITY)
Admission: EM | Admit: 2018-04-29 | Discharge: 2018-04-29 | Disposition: A | Discharge status: Home / Self Care | Payer: Medicaid Other | Attending: Emergency Medicine | Admitting: Emergency Medicine

## 2018-04-29 ENCOUNTER — Other Ambulatory Visit: Payer: Self-pay

## 2018-04-29 ENCOUNTER — Encounter (HOSPITAL_COMMUNITY): Payer: Self-pay

## 2018-04-29 DIAGNOSIS — N760 Acute vaginitis: Secondary | ICD-10-CM | POA: Diagnosis not present

## 2018-04-29 DIAGNOSIS — R3915 Urgency of urination: Secondary | ICD-10-CM | POA: Diagnosis present

## 2018-04-29 DIAGNOSIS — B9689 Other specified bacterial agents as the cause of diseases classified elsewhere: Secondary | ICD-10-CM | POA: Diagnosis not present

## 2018-04-29 DIAGNOSIS — Z79899 Other long term (current) drug therapy: Secondary | ICD-10-CM | POA: Insufficient documentation

## 2018-04-29 LAB — POC URINE PREG, ED: PREG TEST UR: NEGATIVE

## 2018-04-29 LAB — URINALYSIS, ROUTINE W REFLEX MICROSCOPIC
BILIRUBIN URINE: NEGATIVE
Glucose, UA: NEGATIVE mg/dL
HGB URINE DIPSTICK: NEGATIVE
KETONES UR: NEGATIVE mg/dL
Leukocytes, UA: NEGATIVE
Nitrite: NEGATIVE
PROTEIN: NEGATIVE mg/dL
Specific Gravity, Urine: 1.023 (ref 1.005–1.030)
pH: 7 (ref 5.0–8.0)

## 2018-04-29 LAB — WET PREP, GENITAL
Sperm: NONE SEEN
TRICH WET PREP: NONE SEEN
Yeast Wet Prep HPF POC: NONE SEEN

## 2018-04-29 LAB — CBG MONITORING, ED: Glucose-Capillary: 112 mg/dL — ABNORMAL HIGH (ref 70–99)

## 2018-04-29 MED ORDER — METRONIDAZOLE 500 MG PO TABS: 500.0000 mg | ORAL_TABLET | Freq: Two times a day (BID) | ORAL | 0 refills | Status: DC

## 2018-04-29 NOTE — ED Triage Notes (Signed)
Pt states she has been voiding frequently and is concerned for a UTI. Pt states that she also wants a pregnancy test.

## 2018-04-29 NOTE — ED Notes (Signed)
CBG 112.  

## 2018-04-29 NOTE — Discharge Instructions (Signed)
Thank you for allowing me to care for you today in the Emergency Department.   Your pelvic exam was positive for bacterial vaginosis.  You can obtain this infection from significant changes to the environment around the vagina.  You should avoid putting a lotion around areas where you pee or poop as this can create a warm moist environment and set you up for infection.  Take 1 tablet of Flagyl by mouth in the morning and 1 tablet of Flagyl by mouth at night for the next week.  Avoid all alcohol while you are drinking this medication because it can cause severe vomiting.  Call the number on your discharge paperwork to get established with a primary care provider.  Follow-up with primary care if you continue to have urinary frequency despite taking the antibiotic for bacterial vaginosis.  Return to the emergency department if you develop severe pelvic pain, high fever, persistent vomiting, or other new, concerning symptoms.

## 2018-04-29 NOTE — ED Provider Notes (Signed)
[facility]-EMERGENCY DEPT Provider Note   CSN: [id number] Arrival date & time: 04/29/18  1452     History   Chief Complaint Chief Complaint  Patient presents with  . Urinary Frequency    HPI Cassandra Hill is a 22 y.o. female with a history of migraines, sacral fracture, and a chronic back pain who presents to the emergency department with a chief complaint of "I think I have a UTI."  The patient endorses urinary urgency over the last few days.  She states that as soon as she voids that within a few minutes she will feel as if she needs to void again.  She reports that last night when she wiped that she noticed some discharge on the toilet tissue that contained some bright red blood.  She reports that she has had some vaginal discharge over the last few days that has been clear and yellow-tinged.  She also reports a longstanding history of back pain secondary to a sacral fracture several years ago.  She reports she has been having worsening bilateral low back pain over the last few days.  No recent falls or injuries.  She denies fever, chills, abdominal pain, nausea, vomiting, diarrhea, constipation, numbness, weakness, urinary or fecal incontinence, vaginal pain, or rash.  LMP was 04/14/2018.  States that she has a family history of blood clots and does not currently take any form of birth control.  She also has a family history of diabetes mellitus.  She is unsure if she could be pregnant as she currently lives with her significant other.  She is sexually active with one female partner, and they do not use condoms.  The history is provided by the patient. No language interpreter was used.    Past Medical History:  Diagnosis Date  . Arthritis   . Back pain   . Migraine     There are no active problems to display for this patient.   Past Surgical History:  Procedure Laterality Date  . INDUCED ABORTION    . tubes in ears       OB History   No obstetric  history on file.      Home Medications    Prior to Admission medications   Medication Sig Start Date End Date Taking? Authorizing Provider  aspirin-acetaminophen-caffeine (EXCEDRIN MIGRAINE) [phone] MG tablet Take 2-3 tablets by mouth every 6 (six) hours as needed for headache or migraine.   Yes [provider]  diphenhydrAMINE (BENADRYL) 25 MG tablet Take 25 mg by mouth daily as needed for allergies.   Yes [provider]  amoxicillin-clavulanate (AUGMENTIN) 875-125 MG tablet Take 1 tablet by mouth every 12 (twelve) hours. Patient not taking: Reported on 04/29/2018 04/17/18   Clayborne Artist, PA-C  cephALEXin (KEFLEX) 500 MG capsule Take 1 capsule (500 mg total) by mouth 4 (four) times daily. Patient not taking: Reported on 04/29/2018 01/28/18   Dietrich Pates, PA-C  ferrous sulfate 325 (65 FE) MG tablet Take 325 mg by mouth daily with breakfast.    [provider]  fluticasone (FLONASE) 50 MCG/ACT nasal spray Place 1 spray into both nostrils daily. Patient not taking: Reported on 04/29/2018 10/22/17   Petrucelli, Pleas Koch, PA-C  ibuprofen (ADVIL,MOTRIN) 800 MG tablet Take 1 tablet (800 mg total) by mouth every 8 (eight) hours as needed. Patient not taking: Reported on 04/29/2018 12/16/17   Charlestine Night, PA-C  metroNIDAZOLE (FLAGYL) 500 MG tablet Take 1 tablet (500 mg total) by mouth 2 (  two) times daily. 04/29/18   , Coral Else, PA-C    Family History Family History  Problem Relation Age of Onset  . Diabetes Mother   . Hypertension Mother   . Hypertension Father     Social History Social History   Tobacco Use  . Smoking status: Never Smoker  . Smokeless tobacco: Never Used  Substance Use Topics  . Alcohol use: Yes    Comment: "Every once a while"   . Drug use: Yes    Types: Marijuana    Comment: Last use: 2 weeks ago     Allergies   Patient has no known allergies.   Review of Systems Review of Systems  Constitutional:  Negative for activity change.  Respiratory: Negative for shortness of breath.   Cardiovascular: Negative for chest pain.  Gastrointestinal: Negative for abdominal pain.  Endocrine: Negative for polydipsia and polyphagia.  Genitourinary: Positive for frequency, vaginal bleeding and vaginal discharge. Negative for dysuria, hematuria, urgency and vaginal pain.       Urinary urgency  Musculoskeletal: Negative for back pain.  Skin: Negative for rash.  Allergic/Immunologic: Negative for immunocompromised state.  Neurological: Negative for weakness, numbness and headaches.  Psychiatric/Behavioral: Negative for confusion.     Physical Exam Updated Vital Signs BP 130/69 (BP Location: Right Arm)   Pulse 66   Temp 98.9 F (37.2 C) (Oral)   Resp 16   Ht 5\' 4"  (1.626 m)   Wt 60 kg   SpO2 100%   BMI 22.71 kg/m   Physical Exam Vitals signs and nursing note reviewed. Exam conducted with a chaperone present.  Constitutional:      General: She is not in acute distress. HENT:     Head: Normocephalic.  Eyes:     Conjunctiva/sclera: Conjunctivae normal.  Neck:     Musculoskeletal: Neck supple.  Cardiovascular:     Rate and Rhythm: Normal rate and regular rhythm.     Heart sounds: No murmur. No friction rub. No gallop.   Pulmonary:     Effort: Pulmonary effort is normal. No respiratory distress.     Breath sounds: Normal breath sounds. No stridor. No wheezing or rhonchi.  Chest:     Chest wall: No tenderness.  Abdominal:     General: There is no distension.     Palpations: Abdomen is soft. There is no mass.     Tenderness: There is no abdominal tenderness. There is left CVA tenderness. There is no right CVA tenderness, guarding or rebound.     Hernia: No hernia is present.     Comments: Abdomen is soft, nontender, nondistended.  Genitourinary:    Comments: Chaperoned exam.  There is a scant amount of yellowish discharge in the vaginal vault.  No adnexal tenderness or enlargement  bilaterally.  No cervical motion tenderness.  Musculoskeletal:     Comments: No tenderness to the cervical, thoracic, or lumbar spinous processes.  Mild tenderness to the sacral spinous processes with bilateral paraspinal and muscular tenderness.  No overlying erythema, edema, or warmth.  Skin:    General: Skin is warm.     Findings: No rash.  Neurological:     Mental Status: She is alert.  Psychiatric:        Behavior: Behavior normal.      ED Treatments / Results  Labs (all labs ordered are listed, but only abnormal results are displayed) Labs Reviewed  WET PREP, GENITAL - Abnormal; Notable for the following components:      Result  Value   Clue Cells Wet Prep HPF POC PRESENT (*)    WBC, Wet Prep HPF POC MANY (*)    All other components within normal limits  URINALYSIS, ROUTINE W REFLEX MICROSCOPIC - Abnormal; Notable for the following components:   APPearance CLOUDY (*)    All other components within normal limits  CBG MONITORING, ED - Abnormal; Notable for the following components:   Glucose-Capillary 112 (*)    All other components within normal limits  POC URINE PREG, ED  GC/CHLAMYDIA PROBE AMP () NOT AT [facility]    EKG None  Radiology No results found.  Procedures Procedures (including critical care time)  Medications Ordered in ED Medications - No data to display   Initial Impression / Assessment and Plan / ED Course  I have reviewed the triage vital signs and the nursing notes.  Pertinent labs & imaging results that were available during my care of the patient were reviewed by me and considered in my medical decision making (see chart for details).     22 year old female with a history of migraines, sacral fracture, and a chronic back pain presenting with urinary urgency, vaginal discharge, and bilateral low back pain.  UA is unremarkable.  Given concern for urinary urgency versus frequency, CBG was ordered, which was 112.  The patient also does  endorse some vaginal discharge.  Wet prep with clue cells and WBCs.  Gonorrhea and Chlamydia are pending.  Low suspicion for positive gonorrhea or chlamydia at this time.  The patient reports that she was treated for chlamydia in the past 2 years.  Will discharge with Flagyl for 1 week.  Abdomen is soft, nontender, nondistended.  Doubt PID, hyperglycemia, DKA, HHS, sacral fracture, UTI, or pyelonephritis.  Recommended treatment for BV and follow-up with primary care for recheck if symptoms persist.  Strict return precautions given.  She is hemodynamically stable and in no acute distress.  She is safe for discharge home with outpatient follow-up at this time.  Final Clinical Impressions(s) / ED Diagnoses   Final diagnoses:  Bacterial vaginosis    ED Discharge Orders         Ordered    metroNIDAZOLE (FLAGYL) 500 MG tablet  2 times daily     04/29/18 1924           McDonaldCoral Else,  A, PA-C 04/29/18 2034    Arby BarrettePfeiffer, Marcy, MD 05/02/18 [phone]

## 2018-04-30 LAB — GC/CHLAMYDIA PROBE AMP (~~LOC~~) NOT AT ARMC
Chlamydia: NEGATIVE
Neisseria Gonorrhea: NEGATIVE

## 2018-06-17 ENCOUNTER — Emergency Department (HOSPITAL_COMMUNITY)
Admission: EM | Admit: 2018-06-17 | Discharge: 2018-06-17 | Disposition: A | Discharge status: Home / Self Care | Payer: Medicaid Other | Attending: Emergency Medicine | Admitting: Emergency Medicine

## 2018-06-17 ENCOUNTER — Encounter (HOSPITAL_COMMUNITY): Payer: Self-pay

## 2018-06-17 ENCOUNTER — Other Ambulatory Visit: Payer: Self-pay

## 2018-06-17 DIAGNOSIS — L03811 Cellulitis of head [any part, except face]: Secondary | ICD-10-CM | POA: Insufficient documentation

## 2018-06-17 DIAGNOSIS — L02512 Cutaneous abscess of left hand: Secondary | ICD-10-CM | POA: Insufficient documentation

## 2018-06-17 DIAGNOSIS — Z79899 Other long term (current) drug therapy: Secondary | ICD-10-CM | POA: Diagnosis not present

## 2018-06-17 DIAGNOSIS — R220 Localized swelling, mass and lump, head: Secondary | ICD-10-CM | POA: Diagnosis present

## 2018-06-17 DIAGNOSIS — L0291 Cutaneous abscess, unspecified: Secondary | ICD-10-CM

## 2018-06-17 DIAGNOSIS — L0201 Cutaneous abscess of face: Secondary | ICD-10-CM | POA: Insufficient documentation

## 2018-06-17 LAB — GROUP A STREP BY PCR: Group A Strep by PCR: NOT DETECTED

## 2018-06-17 MED ORDER — LIDOCAINE HCL (PF) 1 % IJ SOLN
5.0000 mL | Freq: Once | INTRAMUSCULAR | Status: AC
  Administered 2018-06-17: 5 mL
  Filled 2018-06-17: qty 30

## 2018-06-17 NOTE — ED Provider Notes (Signed)
[facility]-EMERGENCY DEPT Provider Note   CSN: [id number] Arrival date & time: 06/17/18  1011     History   Chief Complaint Chief Complaint  Patient presents with  . Abscess  . URI    HPI Cassandra Hill is a 23 y.o. female who presents with abscess to her left forehead and the base of her left middle finger is been present for the past few days.  She reports the area started off as a pimple and she tried to pop them and have become swollen.  She reports she has had drainage from the one on her left forehead, but not in one from her hand.  [facility] and prescribed doxycycline, however no incision and drainage was completed on either.  Patient also reports with a 1 day history of sore throat and cough.  She denies any fevers.  She has been taking DayQuil with some relief at home.  HPI  Past Medical History:  Diagnosis Date  . Arthritis   . Back pain   . Migraine     There are no active problems to display for this patient.   Past Surgical History:  Procedure Laterality Date  . INDUCED ABORTION    . tubes in ears       OB History   No obstetric history on file.      Home Medications    Prior to Admission medications   Medication Sig Start Date End Date Taking? Authorizing Provider  amoxicillin-clavulanate (AUGMENTIN) 875-125 MG tablet Take 1 tablet by mouth every 12 (twelve) hours. Patient not taking: Reported on 04/29/2018 04/17/18   Clayborne ArtistKendrick, Caitlyn S, PA-C  aspirin-acetaminophen-caffeine (EXCEDRIN MIGRAINE) [phone] MG tablet Take 2-3 tablets by mouth every 6 (six) hours as needed for headache or migraine.    [provider]  cephALEXin (KEFLEX) 500 MG capsule Take 1 capsule (500 mg total) by mouth 4 (four) times daily. Patient not taking: Reported on 04/29/2018 01/28/18   Dietrich PatesKhatri, Hina, PA-C  diphenhydrAMINE (BENADRYL) 25 MG tablet Take 25 mg by mouth daily as needed for allergies.    [provider]  ferrous sulfate 325 (65 FE) MG tablet Take 325 mg by mouth daily with breakfast.    [provider]  fluticasone (FLONASE) 50 MCG/ACT nasal spray Place 1 spray into both nostrils daily. Patient not taking: Reported on 04/29/2018 10/22/17   Petrucelli, Pleas KochSamantha R, PA-C  ibuprofen (ADVIL,MOTRIN) 800 MG tablet Take 1 tablet (800 mg total) by mouth every 8 (eight) hours as needed. Patient not taking: Reported on 04/29/2018 12/16/17   Charlestine NightLawyer, Christopher, PA-C  metroNIDAZOLE (FLAGYL) 500 MG tablet Take 1 tablet (500 mg total) by mouth 2 (two) times daily. 04/29/18   McDonald, Coral ElseMia A, PA-C    Family History Family History  Problem Relation Age of Onset  . Diabetes Mother   . Hypertension Mother   . Hypertension Father     Social History Social History   Tobacco Use  . Smoking status: Never Smoker  . Smokeless tobacco: Never Used  Substance Use Topics  . Alcohol use: Yes    Comment: "Every once a while"   . Drug use: Yes    Types: Marijuana    Comment: Last use: 2 weeks ago     Allergies   Patient has no known allergies.   Review of Systems Review of Systems  Constitutional: Negative for fever.  HENT: Positive for sore throat.   Respiratory: Positive for cough.  Skin: Positive for wound.     Physical Exam Updated Vital Signs BP 125/83 (BP Location: Right Arm)   Pulse 81   Temp 98.8 F (37.1 C) (Oral)   Resp 16   Ht 5\' 2"  (1.575 m)   LMP 06/07/2018   SpO2 100%   BMI 24.19 kg/m   Physical Exam Vitals signs and nursing note reviewed.  Constitutional:      General: She is not in acute distress.    Appearance: She is well-developed. She is not diaphoretic.  HENT:     Head: Normocephalic and atraumatic.     Mouth/Throat:     Pharynx: No oropharyngeal exudate.  Eyes:     General: No scleral icterus.       Right eye: No discharge.        Left eye: No discharge.     Conjunctiva/sclera: Conjunctivae normal.     Pupils: Pupils are equal, round, and  reactive to light.  Neck:     Musculoskeletal: Normal range of motion and neck supple.     Thyroid: No thyromegaly.  Cardiovascular:     Rate and Rhythm: Normal rate and regular rhythm.     Heart sounds: Normal heart sounds. No murmur. No friction rub. No gallop.   Pulmonary:     Effort: Pulmonary effort is normal. No respiratory distress.     Breath sounds: Normal breath sounds. No stridor. No wheezing or rales.  Abdominal:     General: Bowel sounds are normal. There is no distension.     Palpations: Abdomen is soft.     Tenderness: There is no abdominal tenderness. There is no guarding or rebound.  Musculoskeletal:       Hands:     Comments: L hand Abscess as discussed on the palmar aspect, full range of motion of the digits, sensation intact; cap refill less than 2 seconds  Lymphadenopathy:     Cervical: No cervical adenopathy.  Skin:    General: Skin is warm and dry.     Coloration: Skin is not pale.     Findings: No rash.  Neurological:     Mental Status: She is alert.     Coordination: Coordination normal.      ED Treatments / Results  Labs (all labs ordered are listed, but only abnormal results are displayed) Labs Reviewed  GROUP A STREP BY PCR    EKG None  Radiology No results found.  Procedures .Marland KitchenIncision and Drainage Date/Time: 06/17/2018 1:56 PM Performed by: Emi Holes, PA-C Authorized by: Emi Holes, PA-C   Consent:    Consent obtained:  Verbal   Consent given by:  Patient   Risks discussed:  Bleeding, incomplete drainage and infection   Alternatives discussed:  No treatment Location:    Type:  Abscess   Size:  1   Location:  Upper extremity   Upper extremity location:  Hand   Hand location:  L hand Pre-procedure details:    Skin preparation:  Chloraprep Anesthesia (see MAR for exact dosages):    Anesthesia method:  Local infiltration   Local anesthetic:  Lidocaine 1% w/o epi Procedure type:    Complexity:  Simple Procedure  details:    Needle aspiration: no     Incision types:  Single straight   Incision depth:  Subcutaneous   Scalpel blade:  11   Drainage:  Bloody and purulent   Drainage amount:  Scant   Wound treatment:  Wound left open   Packing materials:  None Post-procedure details:    Patient tolerance of procedure:  Tolerated well, no immediate complications Comments:     Very scant amount of purulent drainage, mostly bloody Ultrasound ED Soft Tissue Date/Time: 06/17/2018 1:57 PM Performed by: Emi Holes, PA-C Authorized by: Emi Holes, PA-C   Procedure details:    Indications: localization of abscess and evaluate for cellulitis     Transverse view:  Visualized   Longitudinal view:  Visualized   Images: archived   Location:    Location: upper extremity     Side:  Left Findings:     abscess present    cellulitis present    no foreign body present   (including critical care time)  Medications Ordered in ED Medications  lidocaine (PF) (XYLOCAINE) 1 % injection 5 mL (5 mLs Infiltration Given 06/17/18 1312)     Initial Impression / Assessment and Plan / ED Course  I have reviewed the triage vital signs and the nursing notes.  Pertinent labs & imaging results that were available during my care of the patient were reviewed by me and considered in my medical decision making (see chart for details).     Patient presenting with 2 abscesses.  Bedside ultrasound shows a very small fluid collection on the left palmar aspect.  No fluid collection seen on the left forehead.  There is been active drainage from the forehead abscess.  There remains an area of cellulitis about the size of a dime.  I&D performed to the hand abscess with a small incision, very superficial; procedure as above.  Patient vies to continue doxycycline as well as warm soaks.  Return precautions discussed.  Patient advised return in 2 days for wound recheck.  Strep is negative.  Lungs are clear to auscultation.  No  indication for chest x-ray at this time.  Patient advised to continue DayQuil and NyQuil as she is getting good relief.  Patient understands and agrees with plan.  Patient vital stable throughout ED course and discharged in satisfactory condition.  Patient also evaluated by my attending, Dr. Juleen China, who guided the patient's management and agrees with plan.  Final Clinical Impressions(s) / ED Diagnoses   Final diagnoses:  Abscess  Cellulitis of head except face    ED Discharge Orders    None       Emi Holes, PA-C 06/17/18 1358    Raeford Razor, MD 06/18/18 1326

## 2018-06-17 NOTE — Discharge Instructions (Signed)
Finish doxycycline until completed.  You can take ibuprofen as prescribed over-the-counter, as needed for pain.  Continue taking NyQuil and DayQuil as prescribed at the counter, as needed for your upper respiratory symptoms.  Please return or go to urgent care in 2 days for wound check after incision and drainage.  Please return sooner if you develop any increasing pain, redness, swelling, red streaking from the wound, persistent fever of 100.4, or any other concerning symptoms.

## 2018-06-17 NOTE — ED Triage Notes (Signed)
Pt states that she had an abscess on her left forehead that she tried to "pop". Pt also states she has an abscess at the base of her left middle finger. Pt states she was seen at [facility] for this, but they were unable to culture due to how hard the spot is.  Pt is also c/o URI symptoms.

## 2018-07-01 ENCOUNTER — Emergency Department (HOSPITAL_COMMUNITY)
Admission: EM | Admit: 2018-07-01 | Discharge: 2018-07-01 | Disposition: A | Discharge status: Home / Self Care | Payer: Medicaid Other | Attending: Emergency Medicine | Admitting: Emergency Medicine

## 2018-07-01 ENCOUNTER — Encounter (HOSPITAL_COMMUNITY): Payer: Self-pay

## 2018-07-01 ENCOUNTER — Other Ambulatory Visit: Payer: Self-pay

## 2018-07-01 DIAGNOSIS — Y939 Activity, unspecified: Secondary | ICD-10-CM | POA: Insufficient documentation

## 2018-07-01 DIAGNOSIS — W228XXA Striking against or struck by other objects, initial encounter: Secondary | ICD-10-CM | POA: Diagnosis not present

## 2018-07-01 DIAGNOSIS — S0990XA Unspecified injury of head, initial encounter: Secondary | ICD-10-CM | POA: Diagnosis present

## 2018-07-01 DIAGNOSIS — Z79899 Other long term (current) drug therapy: Secondary | ICD-10-CM | POA: Diagnosis not present

## 2018-07-01 DIAGNOSIS — Y9289 Other specified places as the place of occurrence of the external cause: Secondary | ICD-10-CM | POA: Diagnosis not present

## 2018-07-01 DIAGNOSIS — Y999 Unspecified external cause status: Secondary | ICD-10-CM | POA: Insufficient documentation

## 2018-07-01 HISTORY — DX: Irritable bowel syndrome, unspecified: K58.9

## 2018-07-01 NOTE — ED Triage Notes (Signed)
Patient reports that she was pulling on a water hose which was stuck and when she got the hose loose it flew back and hit her above the right eye. Patient c/o pain and bruising to that area. Patient denies any blood thinners or blurred vision at this time.

## 2018-07-01 NOTE — Discharge Instructions (Addendum)
You may alternate ibuprofen or Tylenol for your pain.  You may also place ice to the area every 20 minutes on and off, make sure you do not burn your skin with ice.  If you experience any dizziness, nausea, worsening symptoms please return to the emergency department.

## 2018-07-01 NOTE — ED Notes (Signed)
1 cm scratch noted above r/eyebrow.  Pt stated that the metal tip of a garden hose struck the area above her r/eyebrow. Injury occurred 48 hours ago. Pt applied ice to swollen area once yesterday.

## 2018-07-01 NOTE — ED Provider Notes (Signed)
[facility]-EMERGENCY DEPT Provider Note   CSN: [id number] Arrival date & time: 07/01/18  7340    History   Chief Complaint Chief Complaint  Patient presents with  . Head Injury    HPI Cassandra Hill is a 23 y.o. female.     23 y.o female with a PMH of IBS, Migraine presents to the ED with a chief complaint of facial injury x2 days.  Patient reports she was helping her boyfriend pull hoses out of a horse stand when she realized nobody was holding this hose and pull that hitting herself with the tip of the hose on the right eyebrow.  Reports a small laceration to the area which the bleeding was controlled.  She states applying ice and taking ibuprofen daily for the pain but having some improvement in symptoms.  Does report a slight headache to the right frontal region were a hematoma has appeared.  Patient denies loss of consciousness, blurry vision, dizziness, emesis episode.  He has been ambulating and working appropriately since incident.     Past Medical History:  Diagnosis Date  . Arthritis   . Back pain   . IBS (irritable bowel syndrome)   . Migraine     There are no active problems to display for this patient.   Past Surgical History:  Procedure Laterality Date  . INDUCED ABORTION    . tubes in ears       OB History   No obstetric history on file.      Home Medications    Prior to Admission medications   Medication Sig Start Date End Date Taking? Authorizing Provider  amoxicillin-clavulanate (AUGMENTIN) 875-125 MG tablet Take 1 tablet by mouth every 12 (twelve) hours. Patient not taking: Reported on 04/29/2018 04/17/18   Clayborne Artist, PA-C  aspirin-acetaminophen-caffeine (EXCEDRIN MIGRAINE) [id number] MG tablet Take 2-3 tablets by mouth every 6 (six) hours as needed for headache or migraine.    [provider]  cephALEXin (KEFLEX) 500 MG capsule Take 1 capsule (500 mg total) by mouth 4 (four) times daily. Patient not  taking: Reported on 04/29/2018 01/28/18   Dietrich Pates, PA-C  diphenhydrAMINE (BENADRYL) 25 MG tablet Take 25 mg by mouth daily as needed for allergies.    [provider]  ferrous sulfate 325 (65 FE) MG tablet Take 325 mg by mouth daily with breakfast.    [provider]  fluticasone (FLONASE) 50 MCG/ACT nasal spray Place 1 spray into both nostrils daily. Patient not taking: Reported on 04/29/2018 10/22/17   Petrucelli, Pleas Koch, PA-C  ibuprofen (ADVIL,MOTRIN) 800 MG tablet Take 1 tablet (800 mg total) by mouth every 8 (eight) hours as needed. Patient not taking: Reported on 04/29/2018 12/16/17   Charlestine Night, PA-C  metroNIDAZOLE (FLAGYL) 500 MG tablet Take 1 tablet (500 mg total) by mouth 2 (two) times daily. 04/29/18   McDonald, Coral Else, PA-C    Family History Family History  Problem Relation Age of Onset  . Diabetes Mother   . Hypertension Mother   . Hypertension Father     Social History Social History   Tobacco Use  . Smoking status: Never Smoker  . Smokeless tobacco: Never Used  Substance Use Topics  . Alcohol use: Yes    Comment: "Every once a while"   . Drug use: Not Currently    Types: Marijuana    Comment: 2 months ago     Allergies   Patient has no known allergies.   Review  of Systems Review of Systems  Skin: Positive for wound.  Neurological: Positive for headaches. Negative for facial asymmetry and light-headedness.     Physical Exam Updated Vital Signs BP 118/70 (BP Location: Left Arm)   Pulse 60   Temp 98.5 F (36.9 C) (Oral)   Resp 16   Ht 5\' 4"  (1.626 m)   Wt 66.2 kg   LMP 06/07/2018   SpO2 100%   BMI 25.06 kg/m   Physical Exam Vitals signs and nursing note reviewed.  Constitutional:      General: She is not in acute distress.    Appearance: She is well-developed.  HENT:     Head: Normocephalic and atraumatic.      Mouth/Throat:     Pharynx: No oropharyngeal exudate.  Eyes:     Pupils: Pupils are equal, round,  and reactive to light.  Neck:     Musculoskeletal: Normal range of motion.  Cardiovascular:     Rate and Rhythm: Regular rhythm.     Heart sounds: Normal heart sounds.  Pulmonary:     Effort: Pulmonary effort is normal. No respiratory distress.     Breath sounds: Normal breath sounds.  Abdominal:     General: Bowel sounds are normal. There is no distension.     Palpations: Abdomen is soft.     Tenderness: There is no abdominal tenderness.  Musculoskeletal:        General: No tenderness or deformity.     Right lower leg: No edema.     Left lower leg: No edema.  Skin:    General: Skin is warm and dry.  Neurological:     Mental Status: She is alert and oriented to person, place, and time.     Comments: Alert, oriented, thought content appropriate. Speech fluent without evidence of aphasia. Able to follow 2 step commands without difficulty.  Cranial Nerves:  II:  Peripheral visual fields grossly normal, pupils, round, reactive to light III,IV, VI: ptosis not present, extra-ocular motions intact bilaterally  V,VII: smile symmetric, facial light touch sensation equal VIII: hearing grossly normal bilaterally  IX,X: midline uvula rise  XI: bilateral shoulder shrug equal and strong XII: midline tongue extension  Motor:  5/5 in upper and lower extremities bilaterally including strong and equal grip strength and dorsiflexion/plantar flexion Sensory: light touch normal in all extremities.  Cerebellar: normal finger-to-nose with bilateral upper extremities, pronator drift negative Gait: normal gait and balance       ED Treatments / Results  Labs (all labs ordered are listed, but only abnormal results are displayed) Labs Reviewed - No data to display  EKG None  Radiology No results found.  Procedures Procedures (including critical care time)  Medications Ordered in ED Medications - No data to display   Initial Impression / Assessment and Plan / ED Course  I have reviewed  the triage vital signs and the nursing notes.  Pertinent labs & imaging results that were available during my care of the patient were reviewed by me and considered in my medical decision making (see chart for details).       Patient presents with right frontal hematoma after being struck with hose while helping her boyfriend with horses.  During evaluation does have a hematoma to the right frontal region, she has been applying ice and taking ibuprofen for pain.  CT [place] rule showed no imaging necessary as patient has been ambulatory with no dizziness, blurry vision, stable vitals for the past 3 days.  Neuro  exam is unremarkable, patient will continue to take ibuprofen and ice the area as needed.  I will provide her with a work note as she reports she is unable to place ice while at work.  Return precautions provided at length.  Patient stable for discharge.  Final Clinical Impressions(s) / ED Diagnoses   Final diagnoses:  Injury of head, initial encounter    ED Discharge Orders    None       Claude Manges, PA-C 07/01/18 1222    Geoffery Lyons, MD 07/02/18 [phone]

## 2018-07-16 ENCOUNTER — Inpatient Hospital Stay: Payer: Medicaid Other

## 2018-07-18 ENCOUNTER — Emergency Department (HOSPITAL_COMMUNITY)
Admission: EM | Admit: 2018-07-18 | Discharge: 2018-07-18 | Disposition: A | Discharge status: Home / Self Care | Payer: Medicaid Other | Attending: Emergency Medicine | Admitting: Emergency Medicine

## 2018-07-18 ENCOUNTER — Other Ambulatory Visit: Payer: Self-pay

## 2018-07-18 ENCOUNTER — Encounter (HOSPITAL_COMMUNITY): Payer: Self-pay

## 2018-07-18 DIAGNOSIS — B9789 Other viral agents as the cause of diseases classified elsewhere: Secondary | ICD-10-CM | POA: Diagnosis not present

## 2018-07-18 DIAGNOSIS — Z79899 Other long term (current) drug therapy: Secondary | ICD-10-CM | POA: Insufficient documentation

## 2018-07-18 DIAGNOSIS — J069 Acute upper respiratory infection, unspecified: Secondary | ICD-10-CM | POA: Diagnosis not present

## 2018-07-18 DIAGNOSIS — J029 Acute pharyngitis, unspecified: Secondary | ICD-10-CM | POA: Diagnosis present

## 2018-07-18 LAB — GROUP A STREP BY PCR: GROUP A STREP BY PCR: NOT DETECTED

## 2018-07-18 MED ORDER — FLUTICASONE PROPIONATE 50 MCG/ACT NA SUSP: 1.0000 | Freq: Every day | NASAL | 2 refills | Status: DC

## 2018-07-18 MED ORDER — LIDOCAINE VISCOUS HCL 2 % MT SOLN: 10.0000 mL | OROMUCOSAL | 0 refills | Status: DC | PRN

## 2018-07-18 MED ORDER — DEXAMETHASONE 4 MG PO TABS
10.0000 mg | ORAL_TABLET | Freq: Once | ORAL | Status: AC
  Administered 2018-07-18: 10 mg via ORAL
  Filled 2018-07-18: qty 2

## 2018-07-18 MED ORDER — BENZONATATE 100 MG PO CAPS: 100.0000 mg | ORAL_CAPSULE | Freq: Three times a day (TID) | ORAL | 0 refills | Status: DC

## 2018-07-18 NOTE — ED Triage Notes (Addendum)
Patient c/o sore throat and non productive cough since last night.

## 2018-07-18 NOTE — ED Provider Notes (Signed)
[facility]-EMERGENCY DEPT Provider Note   CSN: [id number] Arrival date & time: 07/18/18  1144    History   Chief Complaint Chief Complaint  Patient presents with  . Sore Throat    HPI Cassandra Hill is a 23 y.o. female presents to the ED with c/o sore throat that started yesterday. Patient taking DayQuil with some relief.     The history is provided by the patient. No language interpreter was used.  Sore Throat  This is a new problem. The current episode started yesterday. The problem occurs constantly. The problem has been gradually worsening. Pertinent negatives include no chest pain, no abdominal pain, no headaches and no shortness of breath. The symptoms are aggravated by swallowing. Nothing relieves the symptoms.    Past Medical History:  Diagnosis Date  . Arthritis   . Back pain   . IBS (irritable bowel syndrome)   . Migraine     There are no active problems to display for this patient.   Past Surgical History:  Procedure Laterality Date  . INDUCED ABORTION    . tubes in ears       OB History   No obstetric history on file.      Home Medications    Prior to Admission medications   Medication Sig Start Date End Date Taking? Authorizing Provider  aspirin-acetaminophen-caffeine (EXCEDRIN MIGRAINE) 870-100-7622 MG tablet Take 2-3 tablets by mouth every 6 (six) hours as needed for headache or migraine.    [provider]  benzonatate (TESSALON) 100 MG capsule Take 1 capsule (100 mg total) by mouth every 8 (eight) hours. 07/18/18   Janne Napoleon, NP  diphenhydrAMINE (BENADRYL) 25 MG tablet Take 25 mg by mouth daily as needed for allergies.    [provider]  ferrous sulfate 325 (65 FE) MG tablet Take 325 mg by mouth daily with breakfast.    [provider]  fluticasone (FLONASE) 50 MCG/ACT nasal spray Place 1 spray into both nostrils daily. 07/18/18   Janne Napoleon, NP  lidocaine (XYLOCAINE) 2 % solution Use as  directed 10 mLs in the mouth or throat every 4 (four) hours as needed for mouth pain. 07/18/18   Janne Napoleon, NP    Family History Family History  Problem Relation Age of Onset  . Diabetes Mother   . Hypertension Mother   . Hypertension Father     Social History Social History   Tobacco Use  . Smoking status: Never Smoker  . Smokeless tobacco: Never Used  Substance Use Topics  . Alcohol use: Yes    Comment: "Every once a while"   . Drug use: Not Currently    Types: Marijuana    Comment: 2 months ago     Allergies   Patient has no known allergies.   Review of Systems Review of Systems  Constitutional: Negative for chills and fever.  HENT: Positive for congestion and sore throat. Negative for ear pain and trouble swallowing.   Eyes: Negative for photophobia and redness.  Respiratory: Negative for shortness of breath.   Cardiovascular: Negative for chest pain.  Gastrointestinal: Negative for abdominal pain, nausea and vomiting.  Genitourinary: Negative for dysuria and urgency.  Musculoskeletal: Negative for back pain.  Skin: Negative for rash.  Neurological: Negative for headaches.  Psychiatric/Behavioral: Negative for confusion.     Physical Exam Updated Vital Signs BP 129/80 (BP Location: Left Arm)   Pulse 86   Temp 98.5 F (36.9 C) (Oral)  Resp 12   Ht 5\' 4"  (1.626 m)   Wt 66.2 kg   LMP 06/29/2018   SpO2 100%   BMI 25.06 kg/m   Physical Exam Vitals signs and nursing note reviewed.  Constitutional:      General: She is not in acute distress.    Appearance: She is well-developed.  HENT:     Head: Normocephalic.     Right Ear: Tympanic membrane normal.     Left Ear: No mastoid tenderness. Tympanic membrane is scarred.     Mouth/Throat:     Mouth: Mucous membranes are moist.     Pharynx: Uvula midline. Posterior oropharyngeal erythema present. No oropharyngeal exudate.  Eyes:     Extraocular Movements: Extraocular movements intact.      Conjunctiva/sclera: Conjunctivae normal.  Neck:     Musculoskeletal: Neck supple.  Cardiovascular:     Rate and Rhythm: Normal rate and regular rhythm.  Pulmonary:     Effort: Pulmonary effort is normal.     Breath sounds: Transmitted upper airway sounds present.  Abdominal:     Palpations: Abdomen is soft.     Tenderness: There is no abdominal tenderness.  Musculoskeletal: Normal range of motion.  Skin:    General: Skin is warm and dry.  Neurological:     Mental Status: She is alert and oriented to person, place, and time.     Cranial Nerves: No cranial nerve deficit.  Psychiatric:        Mood and Affect: Mood normal.      ED Treatments / Results  Labs (all labs ordered are listed, but only abnormal results are displayed) Labs Reviewed  GROUP A STREP BY PCR   Radiology No results found.  Procedures Procedures (including critical care time)  Medications Ordered in ED Medications  dexamethasone (DECADRON) tablet 10 mg (10 mg Oral Given 07/18/18 1435)     Initial Impression / Assessment and Plan / ED Course  I have reviewed the triage vital signs and the nursing notes. Patients symptoms are consistent with URI, likely viral etiology. Discussed that antibiotics are not indicated for viral infections. Pt will be discharged with symptomatic treatment.  Verbalizes understanding and is agreeable with plan. Pt is hemodynamically stable & in NAD prior to dc.  Final Clinical Impressions(s) / ED Diagnoses   Final diagnoses:  Viral URI with cough    ED Discharge Orders         Ordered    fluticasone (FLONASE) 50 MCG/ACT nasal spray  Daily     07/18/18 1422    benzonatate (TESSALON) 100 MG capsule  Every 8 hours     07/18/18 1422    lidocaine (XYLOCAINE) 2 % solution  Every 4 hours PRN     07/18/18 1422           Kerrie Buffalo Huntley, NP 07/18/18 1451    Tegeler, Canary Brim, MD 07/18/18 1531

## 2018-07-18 NOTE — ED Notes (Signed)
Bed: WTR8 Expected date:  Expected time:  Means of arrival:  Comments: 

## 2018-07-18 NOTE — Discharge Instructions (Addendum)
Take tylenol and ibuprofen as needed for pain. Follow up with your doctor. Return here as needed.  °

## 2018-07-18 NOTE — ED Notes (Signed)
Patient given discharge teaching and verbalized understanding. Patient ambulated out of ED with a steady gait. 

## 2018-07-23 ENCOUNTER — Other Ambulatory Visit: Payer: Self-pay

## 2018-07-23 ENCOUNTER — Emergency Department (HOSPITAL_COMMUNITY)
Admission: EM | Admit: 2018-07-23 | Discharge: 2018-07-23 | Disposition: A | Discharge status: Home / Self Care | Payer: Medicaid Other | Attending: Emergency Medicine | Admitting: Emergency Medicine

## 2018-07-23 ENCOUNTER — Encounter (HOSPITAL_COMMUNITY): Payer: Self-pay

## 2018-07-23 DIAGNOSIS — Z79899 Other long term (current) drug therapy: Secondary | ICD-10-CM | POA: Diagnosis not present

## 2018-07-23 DIAGNOSIS — J069 Acute upper respiratory infection, unspecified: Secondary | ICD-10-CM | POA: Diagnosis not present

## 2018-07-23 DIAGNOSIS — R05 Cough: Secondary | ICD-10-CM | POA: Diagnosis present

## 2018-07-23 DIAGNOSIS — B9789 Other viral agents as the cause of diseases classified elsewhere: Secondary | ICD-10-CM | POA: Insufficient documentation

## 2018-07-23 NOTE — ED Triage Notes (Signed)
Pt states that she was here several days with a head cold and has now moved down to her chest. Pt states that she feels the congestion lower.

## 2018-07-23 NOTE — Discharge Instructions (Signed)
Please read and follow all provided instructions.  Your diagnoses today include:  1. Viral URI with cough     You appear to have an upper respiratory infection (URI). An upper respiratory tract infection, or cold, is a viral infection of the air passages leading to the lungs. It should improve gradually after 5-7 days. You may have a lingering cough that lasts for 2- 4 weeks after the infection.  Tests performed today include: Vital signs. See below for your results today.   Medications prescribed:   Take any prescribed medications only as directed. Treatment for your infection is aimed at treating the symptoms. There are no medications, such as antibiotics, that will cure your infection.   Home care instructions:  Follow any educational materials contained in this packet.   Your illness is contagious and can be spread to others, especially during the first 3 or 4 days. It cannot be cured by antibiotics or other medicines. Take basic precautions such as washing your hands often, covering your mouth when you cough or sneeze, and avoiding public places where you could spread your illness to others.   Please continue drinking plenty of fluids.  Use over-the-counter medicines as needed as directed on packaging for symptom relief.  You may also use ibuprofen or tylenol as directed on packaging for pain or fever.  Do not take multiple medicines containing Tylenol or acetaminophen to avoid taking too much of this medication.  Follow-up instructions: Please follow-up with your primary care provider in the next 3 days for further evaluation of your symptoms if you are not feeling better.   Return instructions:  Please return to the Emergency Department if you experience worsening symptoms.  RETURN IMMEDIATELY IF you develop shortness of breath, chest pain, confusion or altered mental status, a new rash, become dizzy, faint, or poorly responsive, or are unable to be cared for at home. Please return  if you have persistent vomiting and cannot keep down fluids or develop a fever that is not controlled by tylenol or motrin.   Please return if you have any other emergent concerns.  Additional Information:  Your vital signs today were: BP 132/81 (BP Location: Left Arm)    Pulse 68    Temp 98.3 F (36.8 C) (Oral)    Resp 18    Ht 5\' 4"  (1.626 m)    Wt 67 kg    LMP 06/29/2018    SpO2 98%    BMI 25.35 kg/m  If your blood pressure (BP) was elevated above 135/85 this visit, please have this repeated by your doctor within one month. --------------

## 2018-07-23 NOTE — ED Provider Notes (Signed)
[facility]-EMERGENCY DEPT Provider Note   CSN: [id number] Arrival date & time: 07/23/18  1244    History   Chief Complaint Chief Complaint  Patient presents with  . URI    HPI Cassandra Hill is a 23 y.o. female.     HPI  Patient is a 23 year old female with a history of IBS, back pain, headaches presenting for persistent cough.  Patient ports she was evaluated 4 days ago and treated for upper respiratory infection which she reports is improving with the congestion, rhinorrhea, and sore throat, however she feels that she has "congestion" in her upper chest.  Patient reports cough is dry.  No fevers at home.  She denies any shortness of breath or chest pain.  She reports taking NyQuil and DayQuil without full relief.  She has not yet started the [employer] that were prescribed 4 days ago.  No high risk travel history for COVID-19. Pt does work at a hotel.   Past Medical History:  Diagnosis Date  . Arthritis   . Back pain   . IBS (irritable bowel syndrome)   . Migraine     There are no active problems to display for this patient.   Past Surgical History:  Procedure Laterality Date  . INDUCED ABORTION    . tubes in ears       OB History   No obstetric history on file.      Home Medications    Prior to Admission medications   Medication Sig Start Date End Date Taking? Authorizing Provider  aspirin-acetaminophen-caffeine (EXCEDRIN MIGRAINE) [phone] MG tablet Take 2-3 tablets by mouth every 6 (six) hours as needed for headache or migraine.    [provider]  benzonatate (TESSALON) 100 MG capsule Take 1 capsule (100 mg total) by mouth every 8 (eight) hours. 07/18/18   Janne Napoleon, NP  diphenhydrAMINE (BENADRYL) 25 MG tablet Take 25 mg by mouth daily as needed for allergies.    [provider]  ferrous sulfate 325 (65 FE) MG tablet Take 325 mg by mouth daily with breakfast.    [provider]  fluticasone  (FLONASE) 50 MCG/ACT nasal spray Place 1 spray into both nostrils daily. 07/18/18   Janne Napoleon, NP  lidocaine (XYLOCAINE) 2 % solution Use as directed 10 mLs in the mouth or throat every 4 (four) hours as needed for mouth pain. 07/18/18   Janne Napoleon, NP    Family History Family History  Problem Relation Age of Onset  . Diabetes Mother   . Hypertension Mother   . Hypertension Father     Social History Social History   Tobacco Use  . Smoking status: Never Smoker  . Smokeless tobacco: Never Used  Substance Use Topics  . Alcohol use: Yes    Comment: "Every once a while"   . Drug use: Not Currently    Types: Marijuana    Comment: 2 months ago     Allergies   Patient has no known allergies.   Review of Systems Review of Systems  Constitutional: Negative for chills and fever.  HENT: Negative for congestion, rhinorrhea, sinus pain and sore throat.   Respiratory: Positive for cough. Negative for shortness of breath.   Cardiovascular: Negative for chest pain.     Physical Exam Updated Vital Signs BP 132/81 (BP Location: Left Arm)   Pulse 68   Temp 98.3 F (36.8 C) (Oral)   Resp 18   Ht 5\' 4"  (1.626  m)   Wt 67 kg   LMP 06/29/2018   SpO2 98%   BMI 25.35 kg/m   Physical Exam Vitals signs and nursing note reviewed.  Constitutional:      General: She is not in acute distress.    Appearance: She is well-developed. She is not diaphoretic.     Comments: Sitting comfortably in bed.  HENT:     Head: Normocephalic and atraumatic.     Nose: No congestion or rhinorrhea.     Mouth/Throat:     Pharynx: No oropharyngeal exudate or posterior oropharyngeal erythema.  Eyes:     General:        Right eye: No discharge.        Left eye: No discharge.     Conjunctiva/sclera: Conjunctivae normal.     Comments: EOMs normal to gross examination.  Neck:     Musculoskeletal: Normal range of motion.  Cardiovascular:     Rate and Rhythm: Normal rate and regular rhythm.     Heart  sounds: Normal heart sounds.  Pulmonary:     Effort: Pulmonary effort is normal.     Breath sounds: Normal breath sounds. No wheezing or rales.  Abdominal:     General: There is no distension.  Musculoskeletal: Normal range of motion.  Skin:    General: Skin is warm and dry.  Neurological:     Mental Status: She is alert.     Comments: Cranial nerves intact to gross observation. Patient moves extremities without difficulty.  Psychiatric:        Behavior: Behavior normal.        Thought Content: Thought content normal.        Judgment: Judgment normal.      ED Treatments / Results  Labs (all labs ordered are listed, but only abnormal results are displayed) Labs Reviewed - No data to display  EKG None  Radiology No results found.  Procedures Procedures (including critical care time)  Medications Ordered in ED Medications - No data to display   Initial Impression / Assessment and Plan / ED Course  I have reviewed the triage vital signs and the nursing notes.  Pertinent labs & imaging results that were available during my care of the patient were reviewed by me and considered in my medical decision making (see chart for details).        Patient is nontoxic-appearing, afebrile, with normal vital signs.  She is in no acute distress.  She is not having any concerning signs or symptoms of pneumonia as she does not have a productive cough, fever, and she has a completely normal lung exam.  Suspect that patient is having post viral cough, possibly bronchitis.  I engaged in anticipatory guidance with the patient regarding the course of cough after upper respiratory infection.  Encouraged supportive therapy.  Patient can return precautions for any increasing shortness of breath, fevers, chest pain.  Patient is in understanding and agrees with plan of care.  Final Clinical Impressions(s) / ED Diagnoses   Final diagnoses:  Viral URI with cough    ED Discharge Orders    None        Delia Chimes 07/23/18 1350    Bethann Berkshire, MD 07/25/18 1312

## 2019-05-14 NOTE — L&D Delivery Note (Signed)
Patient was C/C/+1 and pushed for <5 minutes with epidural.    NSVD  female infant, Apgars 8/9, weight pending.   The patient had 1st degree laceration repaired with 3-0 vicryl. Fundus was firm. EBL was expected amount. Placenta was delivered intact, succenturiate lobe noted, some membranes manually swept from LUS.  cytotec placed prophylactically. Vagina was clear.  Delayed cord clamping done for 30-60 seconds while warming baby. Baby was vigorous and doing skin to skin with mother.  Philip Aspen

## 2019-08-06 ENCOUNTER — Other Ambulatory Visit: Payer: Self-pay

## 2019-08-06 ENCOUNTER — Inpatient Hospital Stay (HOSPITAL_COMMUNITY)
Admission: AD | Admit: 2019-08-06 | Discharge: 2019-08-06 | Disposition: A | Discharge status: Home / Self Care | Payer: Medicaid Other | Attending: Obstetrics and Gynecology | Admitting: Obstetrics and Gynecology

## 2019-08-06 ENCOUNTER — Encounter (HOSPITAL_COMMUNITY): Payer: Self-pay | Admitting: Obstetrics and Gynecology

## 2019-08-06 DIAGNOSIS — O99612 Diseases of the digestive system complicating pregnancy, second trimester: Secondary | ICD-10-CM | POA: Insufficient documentation

## 2019-08-06 DIAGNOSIS — Z3A21 21 weeks gestation of pregnancy: Secondary | ICD-10-CM | POA: Diagnosis not present

## 2019-08-06 DIAGNOSIS — Z3492 Encounter for supervision of normal pregnancy, unspecified, second trimester: Secondary | ICD-10-CM

## 2019-08-06 DIAGNOSIS — O99891 Other specified diseases and conditions complicating pregnancy: Secondary | ICD-10-CM

## 2019-08-06 DIAGNOSIS — K219 Gastro-esophageal reflux disease without esophagitis: Secondary | ICD-10-CM

## 2019-08-06 DIAGNOSIS — R079 Chest pain, unspecified: Secondary | ICD-10-CM

## 2019-08-06 DIAGNOSIS — O99892 Other specified diseases and conditions complicating childbirth: Secondary | ICD-10-CM

## 2019-08-06 LAB — COMPREHENSIVE METABOLIC PANEL
ALT: 14 U/L (ref 0–44)
AST: 11 U/L — ABNORMAL LOW (ref 15–41)
Albumin: 3.1 g/dL — ABNORMAL LOW (ref 3.5–5.0)
Alkaline Phosphatase: 81 U/L (ref 38–126)
Anion gap: 9 (ref 5–15)
BUN: 9 mg/dL (ref 6–20)
CO2: 22 mmol/L (ref 22–32)
Calcium: 8.6 mg/dL — ABNORMAL LOW (ref 8.9–10.3)
Chloride: 105 mmol/L (ref 98–111)
Creatinine, Ser: 0.46 mg/dL (ref 0.44–1.00)
GFR calc Af Amer: >60 mL/min (ref >60)
GFR calc non Af Amer: >60 mL/min (ref >60)
Glucose, Bld: 94 mg/dL (ref 70–99)
Potassium: 4.1 mmol/L (ref 3.5–5.1)
Sodium: 136 mmol/L (ref 135–145)
Total Bilirubin: 0.3 mg/dL (ref 0.3–1.2)
Total Protein: 6 g/dL — ABNORMAL LOW (ref 6.5–8.1)

## 2019-08-06 LAB — RAPID URINE DRUG SCREEN, HOSP PERFORMED
Amphetamines: NOT DETECTED
Barbiturates: NOT DETECTED
Benzodiazepines: NOT DETECTED
Cocaine: NOT DETECTED
Opiates: NOT DETECTED
Tetrahydrocannabinol: NOT DETECTED

## 2019-08-06 LAB — URINALYSIS, ROUTINE W REFLEX MICROSCOPIC
Bacteria, UA: NONE SEEN
Bilirubin Urine: NEGATIVE
Glucose, UA: NEGATIVE mg/dL
Hgb urine dipstick: NEGATIVE
Ketones, ur: NEGATIVE mg/dL
Nitrite: NEGATIVE
Protein, ur: NEGATIVE mg/dL
Specific Gravity, Urine: 1.02 (ref 1.005–1.030)
pH: 8 (ref 5.0–8.0)

## 2019-08-06 LAB — CBC
HCT: 34 % — ABNORMAL LOW (ref 36.0–46.0)
Hemoglobin: 11.2 g/dL — ABNORMAL LOW (ref 12.0–15.0)
MCH: 27.6 pg (ref 26.0–34.0)
MCHC: 32.9 g/dL (ref 30.0–36.0)
MCV: 83.7 fL (ref 80.0–100.0)
Platelets: 401 10*3/uL — ABNORMAL HIGH (ref 150–400)
RBC: 4.06 MIL/uL (ref 3.87–5.11)
RDW: 12.9 % (ref 11.5–15.5)
WBC: 16.1 10*3/uL — ABNORMAL HIGH (ref 4.0–10.5)
nRBC: 0 % (ref 0.0–0.2)

## 2019-08-06 LAB — LIPASE, BLOOD: Lipase: 22 U/L (ref 11–51)

## 2019-08-06 MED ORDER — ALUM & MAG HYDROXIDE-SIMETH 200-200-20 MG/5ML PO SUSP
30.0000 mL | Freq: Once | ORAL | Status: AC
  Administered 2019-08-06: 30 mL via ORAL
  Filled 2019-08-06: qty 30

## 2019-08-06 MED ORDER — PANTOPRAZOLE SODIUM 20 MG PO TBEC: 20.0000 mg | DELAYED_RELEASE_TABLET | Freq: Every day | ORAL | 0 refills | Status: DC

## 2019-08-06 NOTE — MAU Note (Signed)
Presents with c/o mid sternal chest pain that radiates down the right side into her ribs.  Reports pain began @ 0300 this morning.  Denies VB or LOF.  Endorses +FM.

## 2019-08-06 NOTE — MAU Provider Note (Signed)
History     CSN: [id number]  Arrival date and time: 08/06/19 6378   First Provider Initiated Contact with Patient 08/06/19 1029      Chief Complaint  Patient presents with  . Chest Pain   HPI Cassandra Hill is a 24 y.o. G3P1011 at [redacted]w[redacted]d who presents to MAU with chief complaint of chest pain, new onset today at 0300. Her pain is directly substernal and radiates to her right upper abdomen. She rates her pain as 6/10. She has not taken medication or tried other treatments for this complaint. She denies SOB, palpitations, weakness, syncope and activity intolerance. She denies history of cardiac issues.  Patient endorses history of "really bad" acid reflux which she manages with daily Prilosec. She reports breakfast of orange juice and a peanut butter sandwich around 0700.  She denies vaginal bleeding, leaking of fluid, decreased fetal movement, fever, falls, or recent illness.   She receives prenatal care with [facility] and her next appointment is 08/19/2019.  OB History    Gravida  3   Para  1   Term  1   Preterm      AB  1   Living  1     SAB  0   TAB  1   Ectopic      Multiple      Live Births  1           Past Medical History:  Diagnosis Date  . Arthritis   . Back pain   . IBS (irritable bowel syndrome)   . Migraine     Past Surgical History:  Procedure Laterality Date  . INDUCED ABORTION    . tubes in ears      Family History  Problem Relation Age of Onset  . Diabetes Mother   . Hypertension Mother   . Hypertension Father     Social History   Tobacco Use  . Smoking status: Never Smoker  . Smokeless tobacco: Never Used  Substance Use Topics  . Alcohol use: Yes    Comment: "Every once a while"   . Drug use: Not Currently    Types: Marijuana    Comment: February 2020    Allergies: No Known Allergies  Medications Prior to Admission  Medication Sig Dispense Refill Last Dose  . ferrous sulfate 325 (65 FE) MG tablet Take  325 mg by mouth daily with breakfast.     . fluticasone (FLONASE) 50 MCG/ACT nasal spray Place 1 spray into both nostrils daily. 16 g 2 Past Week at Unknown time  . loratadine (CLARITIN) 10 MG tablet Take 10 mg by mouth daily.   08/06/2019 at 0700  . Prenatal Vit-Fe Fumarate-FA (MULTIVITAMIN-PRENATAL) 27-0.8 MG TABS tablet Take 1 tablet by mouth daily at 12 noon.   08/06/2019 at 0700  . aspirin-acetaminophen-caffeine (EXCEDRIN MIGRAINE) 250-250-65 MG tablet Take 2-3 tablets by mouth every 6 (six) hours as needed for headache or migraine.     . benzonatate (TESSALON) 100 MG capsule Take 1 capsule (100 mg total) by mouth every 8 (eight) hours. 21 capsule 0   . diphenhydrAMINE (BENADRYL) 25 MG tablet Take 25 mg by mouth daily as needed for allergies.     Marland Kitchen lidocaine (XYLOCAINE) 2 % solution Use as directed 10 mLs in the mouth or throat every 4 (four) hours as needed for mouth pain. 100 mL 0     Review of Systems  Constitutional: Negative for chills, fatigue and fever.  Eyes: Negative for  visual disturbance.  Respiratory: Negative for shortness of breath.   Cardiovascular: Positive for chest pain. Negative for palpitations and leg swelling.  Gastrointestinal: Negative for abdominal pain.  Genitourinary: Negative for vaginal bleeding, vaginal discharge and vaginal pain.  Neurological: Negative for dizziness and headaches.  All other systems reviewed and are negative.  Physical Exam   Blood pressure (!) 125/55, pulse 82, temperature 98.4 F (36.9 C), temperature source Oral, resp. rate 18, height 5\' 2"  (1.575 m), weight 85.3 kg, SpO2 99 %.  Physical Exam  Nursing note and vitals reviewed. Constitutional: She is oriented to person, place, and time. She appears well-developed and well-nourished. No distress.  Cardiovascular: Normal rate, regular rhythm, normal heart sounds, intact distal pulses and normal pulses.  Respiratory: Effort normal and breath sounds normal. No respiratory distress.  GI:  Soft. There is no CVA tenderness.  Neurological: She is alert and oriented to person, place, and time. She has normal strength. She is not disoriented. No cranial nerve deficit or sensory deficit. She displays a negative Romberg sign.  Skin: Skin is warm and dry. She is not diaphoretic.  Psychiatric: She has a normal mood and affect. Her behavior is normal. Judgment and thought content normal.   --Patient speaking in full sentences, repositioning from low to high fowler's independently, ambulating independently, no evidence of air hunger during period of evaluation in MAU  MAU Course/MDM  Procedures  --At bedside 30 min after GI cocktail, patient endorses decreased pain score --EKG reflects normal sinus rhythm, flagged abnormal for "Cannot rule out anterior infarct". Reviewed by Dr. , Cardiology, who advises outpatient management based on unremarkable physical exam in MAU --Chief complaint, Cards consult, and plan for outpatient follow up reviewed with Dr. Jacques Navy, who agrees with my plan of care  Patient Vitals for the past 24 hrs:  BP Temp Temp src Pulse Resp SpO2 Height Weight  08/06/19 1236 113/77 98.5 F (36.9 C) Oral 78 19 100 % - -  08/06/19 1018 (!) 125/55 98.4 F (36.9 C) Oral 82 18 99 % - -  08/06/19 1008 - - - - - - 5\' 2"  (1.575 m) 85.3 kg   Results for orders placed or performed during the hospital encounter of 08/06/19 (from the past 24 hour(s))  Urinalysis, Routine w reflex microscopic     Status: Abnormal   Collection Time: 08/06/19 10:19 AM  Result Value Ref Range   Color, Urine YELLOW YELLOW   APPearance TURBID (A) CLEAR   Specific Gravity, Urine 1.020 1.005 - 1.030   pH 8.0 5.0 - 8.0   Glucose, UA NEGATIVE NEGATIVE mg/dL   Hgb urine dipstick NEGATIVE NEGATIVE   Bilirubin Urine NEGATIVE NEGATIVE   Ketones, ur NEGATIVE NEGATIVE mg/dL   Protein, ur NEGATIVE NEGATIVE mg/dL   Nitrite NEGATIVE NEGATIVE   Leukocytes,Ua MODERATE (A) NEGATIVE   Bacteria, UA NONE  SEEN NONE SEEN   Amorphous Crystal PRESENT   CBC     Status: Abnormal   Collection Time: 08/06/19 11:02 AM  Result Value Ref Range   WBC 16.1 (H) 4.0 - 10.5 K/uL   RBC 4.06 3.87 - 5.11 MIL/uL   Hemoglobin 11.2 (L) 12.0 - 15.0 g/dL   HCT 08/08/19 (L) 08/08/19 - 55.0 %   MCV 83.7 80.0 - 100.0 fL   MCH 27.6 26.0 - 34.0 pg   MCHC 32.9 30.0 - 36.0 g/dL   RDW 15.8 68.2 - 57.4 %   Platelets 401 (H) 150 - 400 K/uL   nRBC 0.0 0.0 -  0.2 %  Comprehensive metabolic panel     Status: Abnormal   Collection Time: 08/06/19 11:02 AM  Result Value Ref Range   Sodium 136 135 - 145 mmol/L   Potassium 4.1 3.5 - 5.1 mmol/L   Chloride 105 98 - 111 mmol/L   CO2 22 22 - 32 mmol/L   Glucose, Bld 94 70 - 99 mg/dL   BUN 9 6 - 20 mg/dL   Creatinine, Ser 0.46 0.44 - 1.00 mg/dL   Calcium 8.6 (L) 8.9 - 10.3 mg/dL   Total Protein 6.0 (L) 6.5 - 8.1 g/dL   Albumin 3.1 (L) 3.5 - 5.0 g/dL   AST 11 (L) 15 - 41 U/L   ALT 14 0 - 44 U/L   Alkaline Phosphatase 81 38 - 126 U/L   Total Bilirubin 0.3 0.3 - 1.2 mg/dL   GFR calc non Af Amer >60 >60 mL/min   GFR calc Af Amer >60 >60 mL/min   Anion gap 9 5 - 15  Lipase, blood     Status: None   Collection Time: 08/06/19 11:02 AM  Result Value Ref Range   Lipase 22 11 - 51 U/L  Rapid urine drug screen (hospital performed)     Status: None   Collection Time: 08/06/19 12:22 PM  Result Value Ref Range   Opiates NONE DETECTED NONE DETECTED   Cocaine NONE DETECTED NONE DETECTED   Benzodiazepines NONE DETECTED NONE DETECTED   Amphetamines NONE DETECTED NONE DETECTED   Tetrahydrocannabinol NONE DETECTED NONE DETECTED   Barbiturates NONE DETECTED NONE DETECTED   Meds ordered this encounter  Medications  . alum & mag hydroxide-simeth (MAALOX/MYLANTA) 200-200-20 MG/5ML suspension 30 mL  . pantoprazole (PROTONIX) 20 MG tablet    Sig: Take 1 tablet (20 mg total) by mouth daily.    Dispense:  30 tablet    Refill:  0    Order Specific Question:   Supervising Provider    Answer:    CONSTANT, PEGGY [[id number]]    Assessment and Plan  --24 y.o. G3P1011 at [redacted]w[redacted]d  --FHT 150 bpm --New regimen including diet revision advised for GERD --Outpatient referral for Cardiology --Pain score 0 at time of discharge --Discharge home in stable condition  F/U: --Esmond Plants 08/19/2019 --Outpatient Cardiology PRN  Darlina Rumpf, CNM 08/06/2019, 2:03 PM

## 2019-08-06 NOTE — Discharge Instructions (Signed)
Food Choices for Gastroesophageal Reflux Disease, Adult When you have gastroesophageal reflux disease (GERD), the foods you eat and your eating habits are very important. Choosing the right foods can help ease your discomfort. Think about working with a nutrition specialist (dietitian) to help you make good choices. What are tips for following this plan?  Meals  Choose healthy foods that are low in fat, such as fruits, vegetables, whole grains, low-fat dairy products, and lean meat, fish, and poultry.  Eat small meals often instead of 3 large meals a day. Eat your meals slowly, and in a place where you are relaxed. Avoid bending over or lying down until 2-3 hours after eating.  Avoid eating meals 2-3 hours before bed.  Avoid drinking a lot of liquid with meals.  Cook foods using methods other than frying. Bake, grill, or broil food instead.  Avoid or limit: ? Chocolate. ? Peppermint or spearmint. ? Alcohol. ? Pepper. ? Black and decaffeinated coffee. ? Black and decaffeinated tea. ? Bubbly (carbonated) soft drinks. ? Caffeinated energy drinks and soft drinks.  Limit high-fat foods such as: ? Fatty meat or fried foods. ? Whole milk, cream, butter, or ice cream. ? Nuts and nut butters. ? Pastries, donuts, and sweets made with butter or shortening.  Avoid foods that cause symptoms. These foods may be different for everyone. Common foods that cause symptoms include: ? Tomatoes. ? Oranges, lemons, and limes. ? Peppers. ? Spicy food. ? Onions and garlic. ? Vinegar. Lifestyle  Maintain a healthy weight. Ask your doctor what weight is healthy for you. If you need to lose weight, work with your doctor to do so safely.  Exercise for at least 30 minutes for 5 or more days each week, or as told by your doctor.  Wear loose-fitting clothes.  Do not smoke. If you need help quitting, ask your doctor.  Sleep with the head of your bed higher than your feet. Use a wedge under the  mattress or blocks under the bed frame to raise the head of the bed. Summary  When you have gastroesophageal reflux disease (GERD), food and lifestyle choices are very important in easing your symptoms.  Eat small meals often instead of 3 large meals a day. Eat your meals slowly, and in a place where you are relaxed.  Limit high-fat foods such as fatty meat or fried foods.  Avoid bending over or lying down until 2-3 hours after eating.  Avoid peppermint and spearmint, caffeine, alcohol, and chocolate. This information is not intended to replace advice given to you by your health care provider. Make sure you discuss any questions you have with your health care provider. Document Revised: 08/20/2018 Document Reviewed: 06/04/2016 Elsevier Patient Education  2020 [employer].  Safe Medications in Pregnancy   Acne: Benzoyl Peroxide Salicylic Acid  Backache/Headache: Tylenol: 2 regular strength every 4 hours OR              2 Extra strength every 6 hours  Colds/Coughs/Allergies: Benadryl (alcohol free) 25 mg every 6 hours as needed Breath right strips Claritin Cepacol throat lozenges Chloraseptic throat spray Cold-Eeze- up to three times per day Cough drops, alcohol free Flonase (by prescription only) Guaifenesin Mucinex Robitussin DM (plain only, alcohol free) Saline nasal spray/drops Sudafed (pseudoephedrine) & Actifed ** use only after [redacted] weeks gestation and if you do not have high blood pressure Tylenol Vicks Vaporub Zinc lozenges Zyrtec   Constipation: Colace Ducolax suppositories Fleet enema Glycerin suppositories Metamucil Milk of magnesia Miralax  Senokot Smooth move tea  Diarrhea: Kaopectate Imodium A-D  *NO pepto Bismol  Hemorrhoids: Anusol Anusol HC Preparation H Tucks  Indigestion: Tums Maalox Mylanta Zantac  Pepcid  Insomnia: Benadryl (alcohol free) 25mg  every 6 hours as needed Tylenol PM Unisom, no Gelcaps  Leg  Cramps: Tums MagGel  Nausea/Vomiting:  Bonine Dramamine Emetrol Ginger extract Sea bands Meclizine  Nausea medication to take during pregnancy:  Unisom (doxylamine succinate 25 mg tablets) Take one tablet daily at bedtime. If symptoms are not adequately controlled, the dose can be increased to a maximum recommended dose of two tablets daily (1/2 tablet in the morning, 1/2 tablet mid-afternoon and one at bedtime). Vitamin B6 100mg  tablets. Take one tablet twice a day (up to 200 mg per day).  Skin Rashes: Aveeno products Benadryl cream or 25mg  every 6 hours as needed Calamine Lotion 1% cortisone cream  Yeast infection: Gyne-lotrimin 7 Monistat 7   **If taking multiple medications, please check labels to avoid duplicating the same active ingredients **take medication as directed on the label ** Do not exceed 4000 mg of tylenol in 24 hours **Do not take medications that contain aspirin or ibuprofen

## 2019-09-15 IMAGING — US US PELVIS COMPLETE TRANSABD/TRANSVAG
1 series · 13 of 25 positions shown · non-contrast
Comparison: None

CLINICAL DATA: Dysuria, abdominal pain and bloating around the time
of the menstrual period.



[Series 1: us pelvis complete transabd/transvag · 0.20mm/px · 13 of 98 slices shown]
[im 1/98]
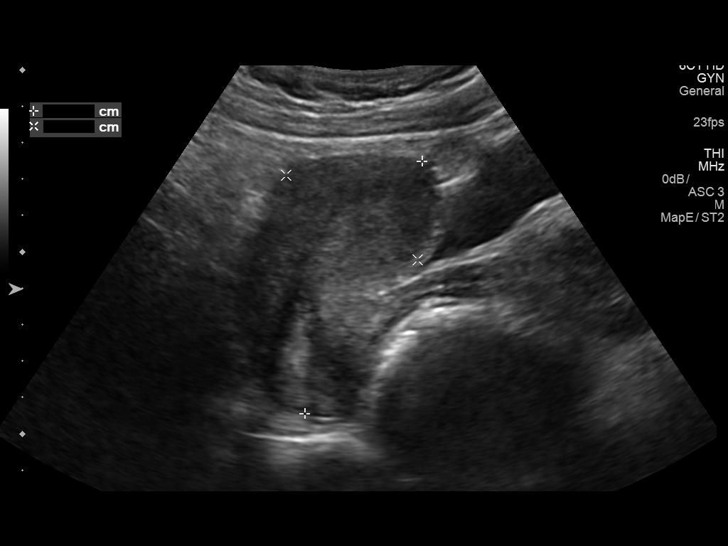
[im 9/98]
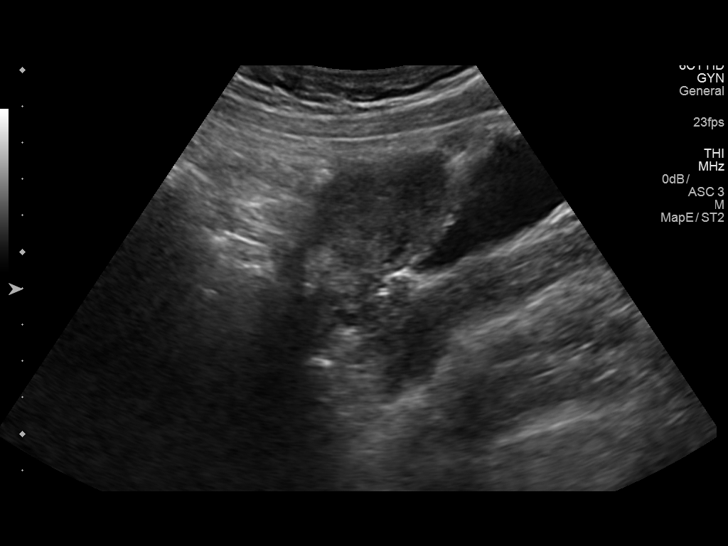
[im 17/98]
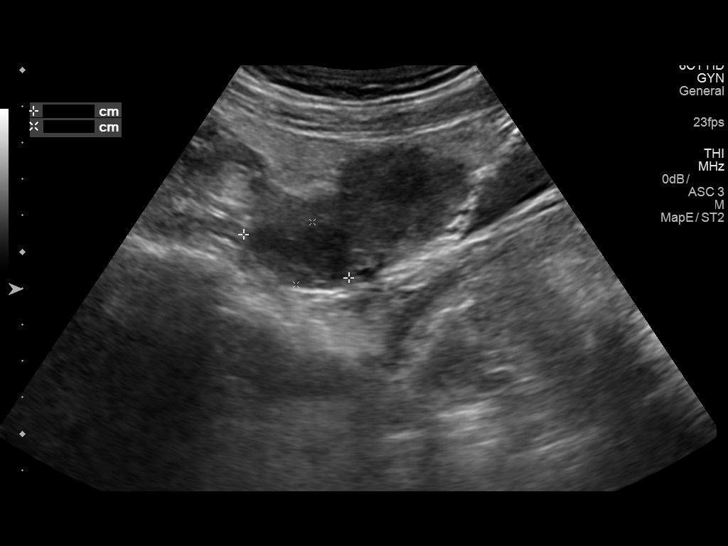
[im 25/98]
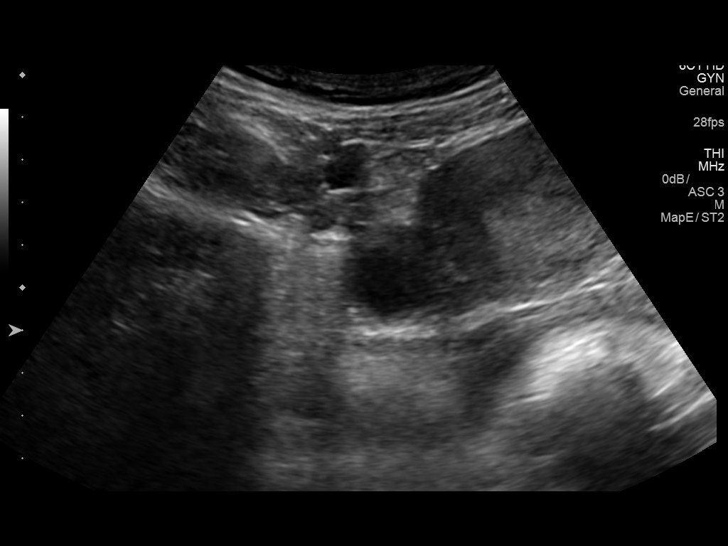
[im 33/98]
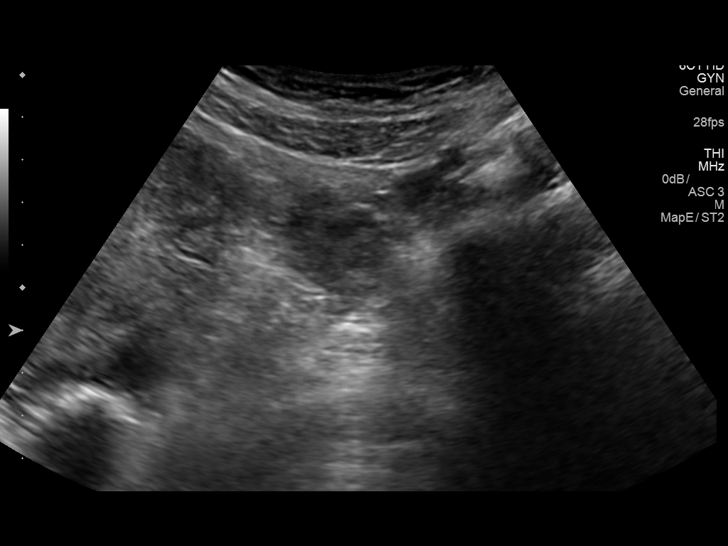
[im 41/98]
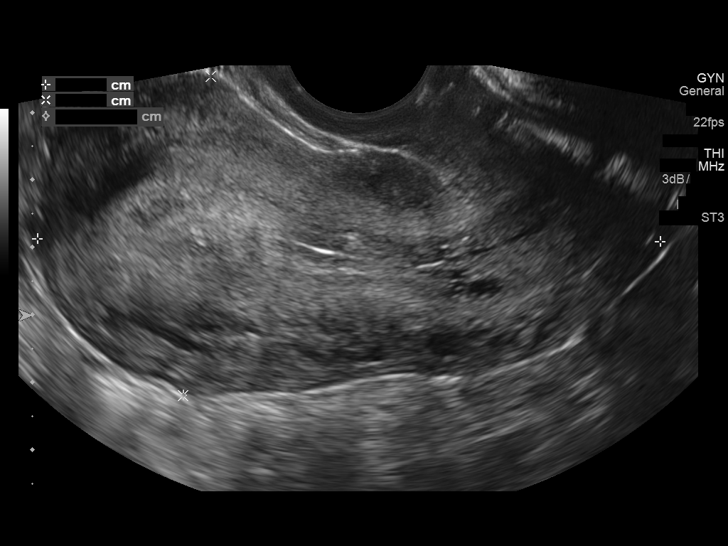
[im 49/98]
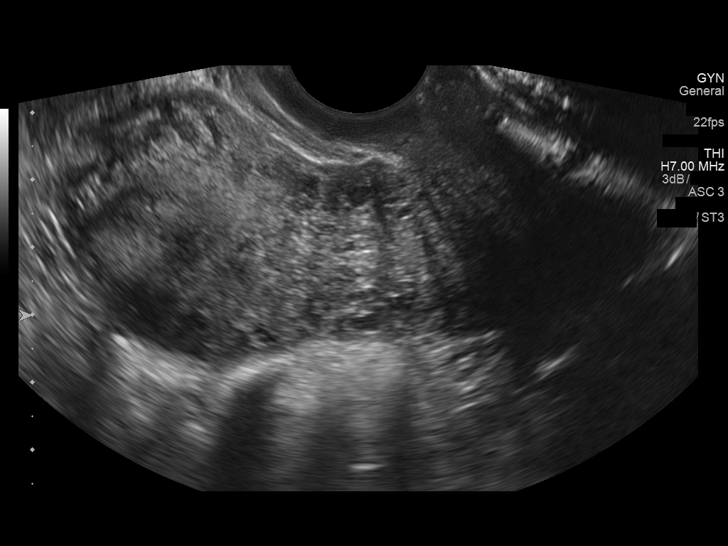
[im 57/98]
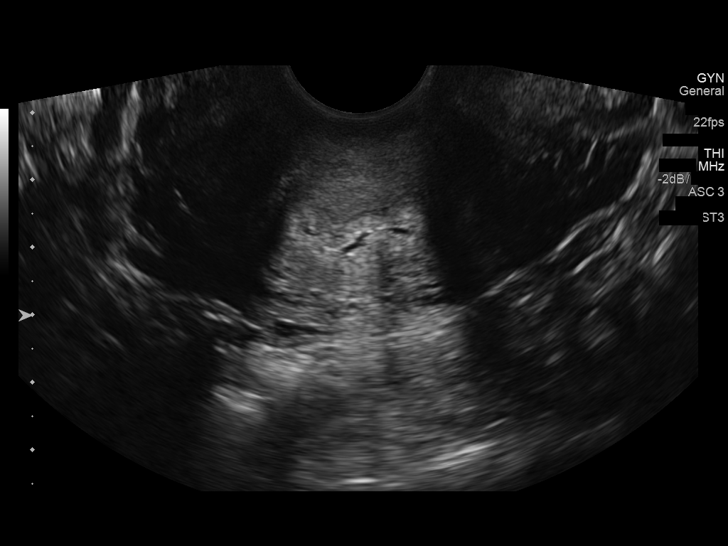
[im 65/98]
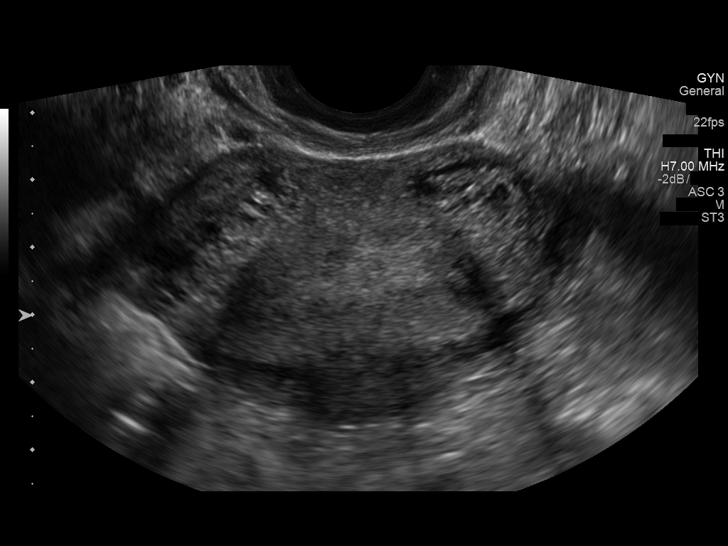
[im 73/98]
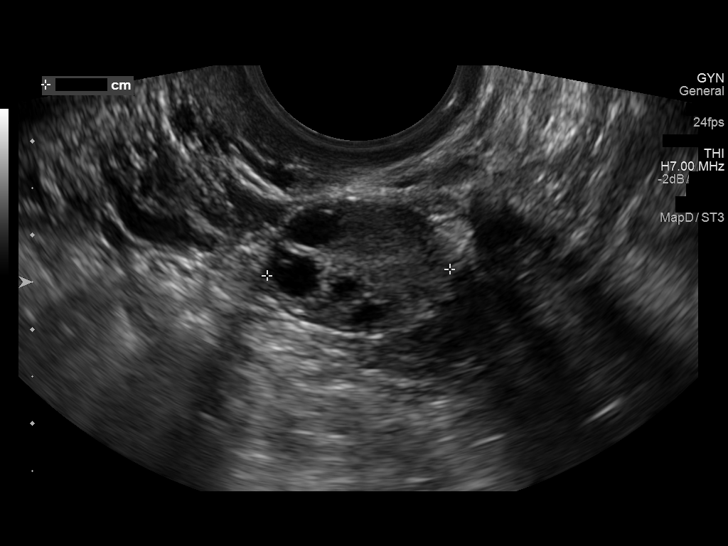
[im 81/98]
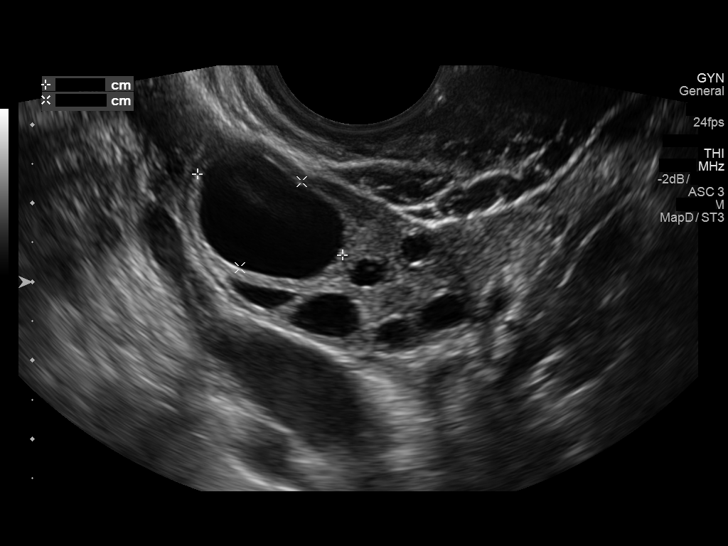
[im 89/98]
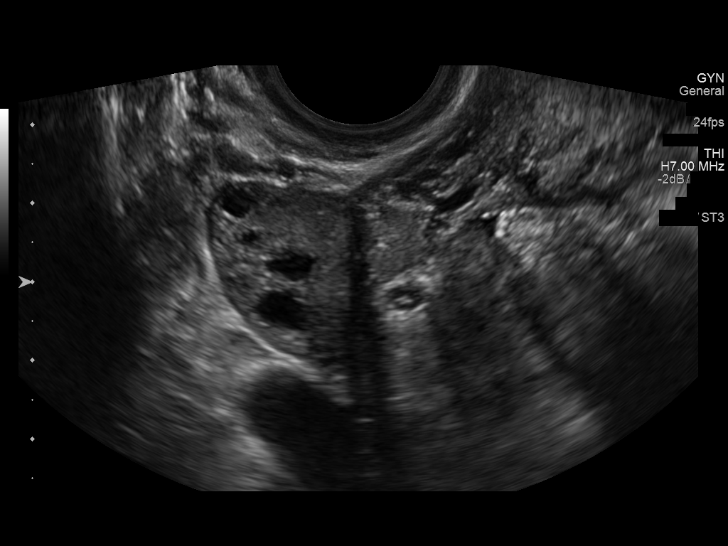
[im 98/98]
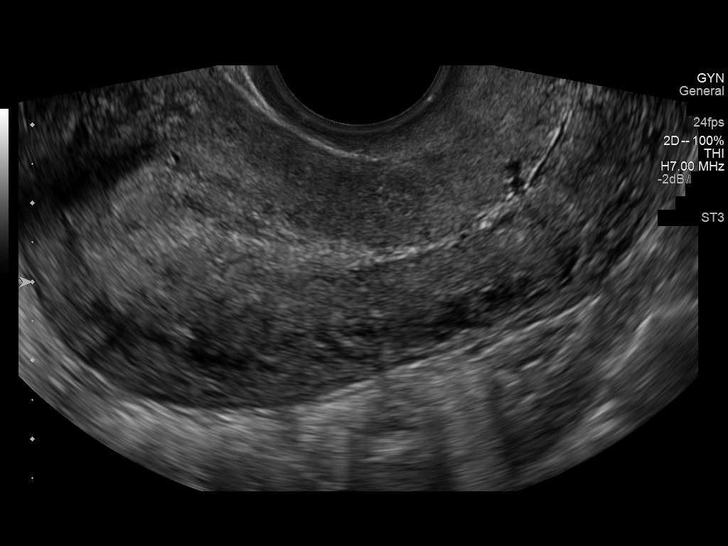

[13 of 25 positions shown; findings below may reference images not displayed]

FINDINGS: Uterus

Measurements: 7.6 x 4.3 x 6.7 cm.. The myometrial echotexture is
normal.

Endometrium

Thickness: 8.6 mm.  No focal abnormality visualized.

Right ovary

Measurements: 4.1 x 2.1 x 2.4 cm. There is a simple appearing cyst
in the right ovary measuring 2.1 x 1.4 x 2 cm.

Left ovary

Measurements: 2.8 x 1.5 x 1.9 cm.. There are developing follicles
within the left ovary.

Other findings

There is no free pelvic fluid.
IMPRESSION: Normal appearance of the uterus and endometrium.

Simple appearing right ovarian cyst measuring 2.1 cm in greatest
dimension. The left ovary is unremarkable.

No free pelvic fluid.

## 2019-11-18 LAB — OB RESULTS CONSOLE GBS: GBS: NEGATIVE

## 2019-11-26 ENCOUNTER — Other Ambulatory Visit: Payer: Self-pay

## 2019-11-26 ENCOUNTER — Inpatient Hospital Stay (HOSPITAL_COMMUNITY): Payer: Medicaid Other | Admitting: Anesthesiology

## 2019-11-26 ENCOUNTER — Inpatient Hospital Stay (HOSPITAL_COMMUNITY)
Admission: AD | Admit: 2019-11-26 | Discharge: 2019-11-28 | DRG: 807 | Disposition: A | Discharge status: Home / Self Care | Payer: Medicaid Other | Attending: Obstetrics | Admitting: Obstetrics

## 2019-11-26 ENCOUNTER — Encounter (HOSPITAL_COMMUNITY): Payer: Self-pay | Admitting: Obstetrics

## 2019-11-26 DIAGNOSIS — Z20822 Contact with and (suspected) exposure to covid-19: Secondary | ICD-10-CM | POA: Diagnosis present

## 2019-11-26 DIAGNOSIS — O99214 Obesity complicating childbirth: Secondary | ICD-10-CM | POA: Diagnosis present

## 2019-11-26 DIAGNOSIS — Z3A37 37 weeks gestation of pregnancy: Secondary | ICD-10-CM | POA: Diagnosis not present

## 2019-11-26 DIAGNOSIS — E669 Obesity, unspecified: Secondary | ICD-10-CM | POA: Diagnosis present

## 2019-11-26 DIAGNOSIS — O26893 Other specified pregnancy related conditions, third trimester: Secondary | ICD-10-CM | POA: Diagnosis present

## 2019-11-26 LAB — TYPE AND SCREEN
ABO/RH(D): A POS
Antibody Screen: NEGATIVE

## 2019-11-26 LAB — CBC
HCT: 36.4 % (ref 36.0–46.0)
Hemoglobin: 11.8 g/dL — ABNORMAL LOW (ref 12.0–15.0)
MCH: 26.5 pg (ref 26.0–34.0)
MCHC: 32.4 g/dL (ref 30.0–36.0)
MCV: 81.8 fL (ref 80.0–100.0)
Platelets: 380 10*3/uL (ref 150–400)
RBC: 4.45 MIL/uL (ref 3.87–5.11)
RDW: 12.2 % (ref 11.5–15.5)
WBC: 13.9 10*3/uL — ABNORMAL HIGH (ref 4.0–10.5)
nRBC: 0 % (ref 0.0–0.2)

## 2019-11-26 LAB — SARS CORONAVIRUS 2 BY RT PCR (HOSPITAL ORDER, PERFORMED IN ~~LOC~~ HOSPITAL LAB): SARS Coronavirus 2: NEGATIVE

## 2019-11-26 LAB — RPR: RPR Ser Ql: NONREACTIVE

## 2019-11-26 LAB — ABO/RH: ABO/RH(D): A POS

## 2019-11-26 LAB — POCT FERN TEST: POCT Fern Test: POSITIVE

## 2019-11-26 MED ORDER — MISOPROSTOL 200 MCG PO TABS: 800.0000 ug | ORAL_TABLET | Freq: Once | ORAL | Status: AC

## 2019-11-26 MED ORDER — LIDOCAINE HCL (PF) 1 % IJ SOLN: 30.0000 mL | INTRAMUSCULAR | Status: DC | PRN

## 2019-11-26 MED ORDER — OXYCODONE-ACETAMINOPHEN 5-325 MG PO TABS: 1.0000 | ORAL_TABLET | ORAL | Status: DC | PRN

## 2019-11-26 MED ORDER — OXYTOCIN BOLUS FROM INFUSION
333.0000 mL | Freq: Once | INTRAVENOUS | Status: AC
  Administered 2019-11-26: 333 mL via INTRAVENOUS

## 2019-11-26 MED ORDER — WITCH HAZEL-GLYCERIN EX PADS: 1.0000 "application " | MEDICATED_PAD | CUTANEOUS | Status: DC | PRN

## 2019-11-26 MED ORDER — SENNOSIDES-DOCUSATE SODIUM 8.6-50 MG PO TABS
2.0000 | ORAL_TABLET | ORAL | Status: DC
  Administered 2019-11-26 – 2019-11-28 (×2): 2 via ORAL
  Filled 2019-11-26 (×2): qty 2

## 2019-11-26 MED ORDER — LACTATED RINGERS IV SOLN: 500.0000 mL | Freq: Once | INTRAVENOUS | Status: DC

## 2019-11-26 MED ORDER — OXYCODONE-ACETAMINOPHEN 5-325 MG PO TABS: 2.0000 | ORAL_TABLET | ORAL | Status: DC | PRN

## 2019-11-26 MED ORDER — ONDANSETRON HCL 4 MG PO TABS
4.0000 mg | ORAL_TABLET | ORAL | Status: DC | PRN
  Administered 2019-11-26: 4 mg via ORAL
  Filled 2019-11-26: qty 1

## 2019-11-26 MED ORDER — TETANUS-DIPHTH-ACELL PERTUSSIS 5-2.5-18.5 LF-MCG/0.5 IM SUSP
0.5000 mL | Freq: Once | INTRAMUSCULAR | Status: AC
  Administered 2019-11-28: 0.5 mL via INTRAMUSCULAR
  Filled 2019-11-26: qty 0.5

## 2019-11-26 MED ORDER — ZOLPIDEM TARTRATE 5 MG PO TABS: 5.0000 mg | ORAL_TABLET | Freq: Every evening | ORAL | Status: DC | PRN

## 2019-11-26 MED ORDER — FENTANYL-BUPIVACAINE-NACL 0.5-0.125-0.9 MG/250ML-% EP SOLN
EPIDURAL | Status: AC
  Filled 2019-11-26: qty 250

## 2019-11-26 MED ORDER — ACETAMINOPHEN 325 MG PO TABS
650.0000 mg | ORAL_TABLET | ORAL | Status: DC | PRN
  Administered 2019-11-26: 650 mg via ORAL
  Filled 2019-11-26: qty 2

## 2019-11-26 MED ORDER — LACTATED RINGERS IV SOLN: INTRAVENOUS | Status: DC

## 2019-11-26 MED ORDER — COCONUT OIL OIL: 1.0000 "application " | TOPICAL_OIL | Status: DC | PRN

## 2019-11-26 MED ORDER — DIBUCAINE (PERIANAL) 1 % EX OINT: 1.0000 "application " | TOPICAL_OINTMENT | CUTANEOUS | Status: DC | PRN

## 2019-11-26 MED ORDER — SIMETHICONE 80 MG PO CHEW: 80.0000 mg | CHEWABLE_TABLET | ORAL | Status: DC | PRN

## 2019-11-26 MED ORDER — LIDOCAINE HCL (PF) 1 % IJ SOLN
INTRAMUSCULAR | Status: DC | PRN
  Administered 2019-11-26 (×2): 4 mL via EPIDURAL

## 2019-11-26 MED ORDER — DIPHENHYDRAMINE HCL 50 MG/ML IJ SOLN: 12.5000 mg | INTRAMUSCULAR | Status: DC | PRN

## 2019-11-26 MED ORDER — ONDANSETRON HCL 4 MG/2ML IJ SOLN: 4.0000 mg | INTRAMUSCULAR | Status: DC | PRN

## 2019-11-26 MED ORDER — EPHEDRINE 5 MG/ML INJ: 10.0000 mg | INTRAVENOUS | Status: DC | PRN

## 2019-11-26 MED ORDER — LACTATED RINGERS IV SOLN: 500.0000 mL | INTRAVENOUS | Status: DC | PRN

## 2019-11-26 MED ORDER — SOD CITRATE-CITRIC ACID 500-334 MG/5ML PO SOLN: 30.0000 mL | ORAL | Status: DC | PRN

## 2019-11-26 MED ORDER — PHENYLEPHRINE 40 MCG/ML (10ML) SYRINGE FOR IV PUSH (FOR BLOOD PRESSURE SUPPORT): 80.0000 ug | PREFILLED_SYRINGE | INTRAVENOUS | Status: DC | PRN

## 2019-11-26 MED ORDER — MISOPROSTOL 200 MCG PO TABS
ORAL_TABLET | ORAL | Status: AC
  Administered 2019-11-26: 800 ug via RECTAL
  Filled 2019-11-26: qty 4

## 2019-11-26 MED ORDER — SODIUM CHLORIDE (PF) 0.9 % IJ SOLN
INTRAMUSCULAR | Status: DC | PRN
  Administered 2019-11-26: 12 mL/h via EPIDURAL

## 2019-11-26 MED ORDER — BENZOCAINE-MENTHOL 20-0.5 % EX AERO: 1.0000 "application " | INHALATION_SPRAY | CUTANEOUS | Status: DC | PRN

## 2019-11-26 MED ORDER — PHENYLEPHRINE 40 MCG/ML (10ML) SYRINGE FOR IV PUSH (FOR BLOOD PRESSURE SUPPORT)
PREFILLED_SYRINGE | INTRAVENOUS | Status: AC
  Filled 2019-11-26: qty 20

## 2019-11-26 MED ORDER — ONDANSETRON HCL 4 MG/2ML IJ SOLN: 4.0000 mg | Freq: Four times a day (QID) | INTRAMUSCULAR | Status: DC | PRN

## 2019-11-26 MED ORDER — FLEET ENEMA 7-19 GM/118ML RE ENEM: 1.0000 | ENEMA | RECTAL | Status: DC | PRN

## 2019-11-26 MED ORDER — IBUPROFEN 600 MG PO TABS
600.0000 mg | ORAL_TABLET | Freq: Four times a day (QID) | ORAL | Status: DC
  Administered 2019-11-26 – 2019-11-28 (×7): 600 mg via ORAL
  Filled 2019-11-26 (×7): qty 1

## 2019-11-26 MED ORDER — FENTANYL-BUPIVACAINE-NACL 0.5-0.125-0.9 MG/250ML-% EP SOLN: 12.0000 mL/h | EPIDURAL | Status: DC | PRN

## 2019-11-26 MED ORDER — OXYTOCIN-SODIUM CHLORIDE 30-0.9 UT/500ML-% IV SOLN
2.5000 [IU]/h | INTRAVENOUS | Status: DC
  Administered 2019-11-26: 2.5 [IU]/h via INTRAVENOUS
  Filled 2019-11-26: qty 500

## 2019-11-26 MED ORDER — PRENATAL MULTIVITAMIN CH
1.0000 | ORAL_TABLET | Freq: Every day | ORAL | Status: DC
  Administered 2019-11-27: 1 via ORAL
  Filled 2019-11-26: qty 1

## 2019-11-26 MED ORDER — DIPHENHYDRAMINE HCL 25 MG PO CAPS: 25.0000 mg | ORAL_CAPSULE | Freq: Four times a day (QID) | ORAL | Status: DC | PRN

## 2019-11-26 NOTE — MAU Note (Signed)
SROM at 0400. Clear fld. Having some ctxs. 2cm last sve.

## 2019-11-26 NOTE — Anesthesia Preprocedure Evaluation (Signed)
Anesthesia Evaluation  Patient identified by MRN, date of birth, ID band Patient awake    Reviewed: Allergy & Precautions, Patient's Chart, lab work & pertinent test results  Airway Mallampati: II  TM Distance: >3 FB Neck ROM: Full    Dental no notable dental hx.    Pulmonary neg pulmonary ROS,    Pulmonary exam normal breath sounds clear to auscultation       Cardiovascular negative cardio ROS Normal cardiovascular exam Rhythm:Regular Rate:Normal     Neuro/Psych  Headaches, negative psych ROS   GI/Hepatic Neg liver ROS, GERD  Medicated,IBS   Endo/Other  Obesity  Renal/GU negative Renal ROS  negative genitourinary   Musculoskeletal  (+) Arthritis ,   Abdominal (+) + obese,   Peds  Hematology  (+) anemia ,   Anesthesia Other Findings   Reproductive/Obstetrics                             Anesthesia Physical Anesthesia Plan  ASA: II  Anesthesia Plan: Epidural   Post-op Pain Management:    Induction:   PONV Risk Score and Plan:   Airway Management Planned: Natural Airway  Additional Equipment:   Intra-op Plan:   Post-operative Plan:   Informed Consent: I have reviewed the patients History and Physical, chart, labs and discussed the procedure including the risks, benefits and alternatives for the proposed anesthesia with the patient or authorized representative who has indicated his/her understanding and acceptance.       Plan Discussed with: Anesthesiologist  Anesthesia Plan Comments:         Anesthesia Quick Evaluation

## 2019-11-26 NOTE — Anesthesia Procedure Notes (Signed)
Epidural Patient location during procedure: OB Start time: 11/26/2019 8:28 AM End time: 11/26/2019 8:37 AM  Staffing Anesthesiologist: Mal Amabile, MD Performed: anesthesiologist   Preanesthetic Checklist Completed: patient identified, IV checked, site marked, risks and benefits discussed, surgical consent, monitors and equipment checked, pre-op evaluation and timeout performed  Epidural Patient position: sitting Prep: DuraPrep and site prepped and draped Patient monitoring: continuous pulse ox and blood pressure Approach: midline Location: L4-L5 Injection technique: LOR air  Needle:  Needle type: Tuohy  Needle gauge: 17 G Needle length: 9 cm and 9 Needle insertion depth: 7 cm Catheter type: closed end flexible Catheter size: 19 Gauge Catheter at skin depth: 12 cm Test dose: negative and Other  Assessment Events: blood not aspirated, injection not painful, no injection resistance, no paresthesia and negative IV test  Additional Notes Patient identified. Risks and benefits discussed including failed block, incomplete  Pain control, post dural puncture headache, nerve damage, paralysis, blood pressure Changes, nausea, vomiting, reactions to medications-both toxic and allergic and post Partum back pain. All questions were answered. Patient expressed understanding and wished to proceed. Sterile technique was used throughout procedure. Epidural site was Dressed with sterile barrier dressing. No paresthesias, signs of intravascular injection Or signs of intrathecal spread were encountered.  Patient was more comfortable after the epidural was dosed. Please see RN's note for documentation of vital signs and FHR which are stable. Reason for block:procedure for pain

## 2019-11-26 NOTE — MAU Note (Signed)
Covid swab obtained without difficulty and pt tol well. No symptoms 

## 2019-11-26 NOTE — Progress Notes (Signed)
Category I FHR, EFM off for epidural

## 2019-11-26 NOTE — H&P (Signed)
24 y.o. [redacted]w[redacted]d  G3P1011 comes in c/o LOF since 0400.  Found to be ruptured in MAU.  Reports some mild irregular contractions.  Otherwise has good fetal movement and no bleeding.  Past Medical History:  Diagnosis Date  . Arthritis   . Back pain   . IBS (irritable bowel syndrome)   . Migraine     Past Surgical History:  Procedure Laterality Date  . INDUCED ABORTION    . tubes in ears      OB History  Gravida Para Term Preterm AB Living  3 1 1   1 1   SAB TAB Ectopic Multiple Live Births  0 1     1    # Outcome Date GA Lbr Len/2nd Weight Sex Delivery Anes PTL Lv  3 Current           2 Term 02/19/14 [redacted]w[redacted]d    Vag-Spont     1 TAB 05/13/13 [redacted]w[redacted]d           Social History   Socioeconomic History  . Marital status: Single    Spouse name: Not on file  . Number of children: Not on file  . Years of education: Not on file  . Highest education level: Not on file  Occupational History  . Not on file  Tobacco Use  . Smoking status: Never Smoker  . Smokeless tobacco: Never Used  Vaping Use  . Vaping Use: Never used  Substance and Sexual Activity  . Alcohol use: Yes    Comment: "Every once a while"   . Drug use: Not Currently    Types: Marijuana    Comment: February 2020  . Sexual activity: Yes    Birth control/protection: Condom  Other Topics Concern  . Not on file  Social History Narrative  . Not on file   Social Determinants of Health   Financial Resource Strain:   . Difficulty of Paying Living Expenses:   Food Insecurity:   . Worried About March 2020 in the Last Year:   . Programme researcher, broadcasting/film/video in the Last Year:   Transportation Needs:   . Barista (Medical):   Freight forwarder Lack of Transportation (Non-Medical):   Physical Activity:   . Days of Exercise per Week:   . Minutes of Exercise per Session:   Stress:   . Feeling of Stress :   Social Connections:   . Frequency of Communication with Friends and Family:   . Frequency of Social Gatherings with Friends and  Family:   . Attends Religious Services:   . Active Member of Clubs or Organizations:   . Attends Marland Kitchen Meetings:   Banker Marital Status:   Intimate Partner Violence:   . Fear of Current or Ex-Partner:   . Emotionally Abused:   Marland Kitchen Physically Abused:   . Sexually Abused:    Patient has no known allergies.    Prenatal Transfer Tool  Maternal Diabetes: No Genetic Screening: Declined Maternal Ultrasounds/Referrals: Normal Fetal Ultrasounds or other Referrals:  Growth scan at 36 weeks 88%, 7#4 est. Maternal Substance Abuse:  No Significant Maternal Medications:  None Significant Maternal Lab Results: Group B Strep negative  Other PNC: prior delivery of 8#8 baby - 3pushes   Vitals:   11/26/19 0651 11/26/19 0702  BP:  130/67  Pulse:  86  Resp:  19  Temp:  98 F (36.7 C)  TempSrc:  Oral  SpO2:  100%  Weight: 95.7 kg   Height: 5\' 2"  (  1.575 m)    Per office exam Lungs/Cor:  NAD Abdomen:  soft, gravid Ex:  no cords, erythema Per MAU: SVE:  4/80/-2 at admission FHTs:  135, good STV, NST R; Cat 1 tracing. Toco:  q 2-6   A/P   Admit to L&D with labor/SROM  GBS Neg  Epidural if desired  Routine care  Philip Aspen

## 2019-11-27 LAB — CBC
HCT: 32.9 % — ABNORMAL LOW (ref 36.0–46.0)
Hemoglobin: 10.6 g/dL — ABNORMAL LOW (ref 12.0–15.0)
MCH: 26.6 pg (ref 26.0–34.0)
MCHC: 32.2 g/dL (ref 30.0–36.0)
MCV: 82.7 fL (ref 80.0–100.0)
Platelets: 336 10*3/uL (ref 150–400)
RBC: 3.98 MIL/uL (ref 3.87–5.11)
RDW: 12.3 % (ref 11.5–15.5)
WBC: 17.4 10*3/uL — ABNORMAL HIGH (ref 4.0–10.5)
nRBC: 0 % (ref 0.0–0.2)

## 2019-11-27 NOTE — Progress Notes (Signed)
MOB was referred for history of ADD. * Referral screened out by Clinical Social Worker because none of the following criteria appear to apply: ~ History of anxiety/depression during this pregnancy, or of post-partum depression following prior delivery. ~ Diagnosis of anxiety and/or depression within last 3 years OR * MOB's symptoms currently being treated with medication and/or therapy.  Please contact the Clinical Social Worker if needs arise, by [facility] request, or if MOB scores greater than 9/yes to question 10 on [place] Postpartum Depression Screen.  Shondrika Hoque D. Dortha Kern, MSW, LCSW Clinical Social Worker [phone]

## 2019-11-27 NOTE — Lactation Note (Signed)
This note was copied from a baby's chart. Lactation Consultation Note  Patient Name: Cassandra Hill [id number] Date: 11/27/2019 Reason for consult: Initial assessment;Difficult latch;Mother's request;Early term 37-38.6wks P2, 10 hour ETI female infant, was in [address] earlier due to low temps.  Per mom, she is active on the [facility] program in [place].  Tools given: mom was given hand pump to pre-pump breast prior to latching infant and breast shells due to mom having inverted nipples. Per mom, infant latched in L&D for 6 minutes, she has made two other attempts to latch infant. Infant has been given formula twice, in [address] and last feeding was formula, per dad 15 mls  Prior to [facility] entering the room. Infant was asleep in basinet, LC unable to assist with latch due infant recently feeding ( formula). Mom's current feeding status is breast and formula feeding.   LC discussed hand expression and mom easily taught back expressing 2 mls in bullet that she will offer infant at next feeding after latching infant at breast. Mom knows to call RN or LC if she needs assistance with latching infant at breast. Mom knows to wear breast shells in bra during the day and not while sleeping nor at night. Mom will do breast stimulation or pre-pump breast with hand pump prior to latching infant at breast to help evert ( inverted nipple out more). Mom knows to BF according to hunger cues, on demand, 8 to 12+ times within 24 hours. Mom shown how to use hand pump  & how to disassemble, clean, & reassemble parts. Mom made aware of O/P services, breastfeeding support groups, community resources, and our phone # for post-discharge questions.     Maternal Data Formula Feeding for Exclusion: No Has patient been taught Hand Expression?: Yes Does the patient have breastfeeding experience prior to this delivery?: Yes  Feeding    LATCH Score                    Interventions Interventions: Breast feeding basics reviewed;Skin to skin;Hand express;Pre-pump if needed;Position options;Expressed milk;Shells;Hand pump  Lactation Tools Discussed/Used Tools: Shells;Pump Shell Type: Inverted Breast pump type: Manual WIC Program: Yes Pump Review: Milk Storage;Setup, frequency, and cleaning Initiated by:: Mom will pre-pump breast prior to latching infant at breast. Date initiated:: 11/27/19   Consult Status Consult Status: Follow-up Date: 11/27/19 Follow-up type: In-patient    Danelle Earthly 11/27/2019, 3:58 AM

## 2019-11-27 NOTE — Anesthesia Postprocedure Evaluation (Signed)
Anesthesia Post Note  Patient: Cassandra Hill  Procedure(s) Performed: AN AD HOC LABOR EPIDURAL     Patient location during evaluation: Mother Baby Anesthesia Type: Epidural Level of consciousness: awake and alert Pain management: pain level controlled Vital Signs Assessment: post-procedure vital signs reviewed and stable Respiratory status: spontaneous breathing, nonlabored ventilation and respiratory function stable Cardiovascular status: stable Postop Assessment: no headache, no backache and epidural receding Anesthetic complications: no   No complications documented.  Last Vitals:  Vitals:   11/27/19 0100 11/27/19 0529  BP: 132/72 115/63  Pulse: 78 67  Resp: 18 18  Temp: 36.9 C 36.6 C  SpO2:      Last Pain:  Vitals:   11/27/19 0715  TempSrc:   PainSc: 0-No pain   Pain Goal:                   [facility] Records

## 2019-11-27 NOTE — Lactation Note (Addendum)
This note was copied from a baby's chart. Lactation Consultation Note  Patient Name: Cassandra Hill Date: 11/27/2019 Reason for consult: Follow-up assessment;Early term 37-38.6wks;Difficult latch;Mother's request;Infant weight loss P2, 24 hour ETI infant,-5% weight loss. Dad changed a void diaper while LC was in the room.  Mom current feeding choice is breast and formula feeding. Tools given: Breast shells mom not been wearing them as previously advised, hand pump she has been pre-pumping prior to latching infant due having inverted nipples, infant been having difficulties sustaining latch and  she made multiple attempts today.  LC fitted mom with 20 mm NS and issued DEBP to pump every 3 hours for 15 minutes on initial setting.  Mom did breast stimulation,  Fitted with 20 mm NS  on mom's left breast and pre-filled with 0.5 mls of Gerber Good start formula with iron, infant sustained latch and BF for 15 minutes.  Infant was supplemented afterwards with 11.5 mls of Gerber with iron using a slow flow bottle nipple. Mom will use DEBP and pump for 15 minutes after latching infant at breast and supplementing with formula to help establish milk supply. When LC entered the room, infant was sucking on pacifier, LC reviewed with parents again the importance of delaying pacifier until 3 to 4 weeks,  because it may cause infant to have disorganized suck and cause breast refusal. Mom will continue to work on latching infant at breast and ask RN or LC for assistance with latch if needed.   Mom's plan: 1. Latch infant according to cues using 20 mm NS. 2. Dad will give infant supplemental formula after mom latches infant at breast using the slow flow bottle nipples, supplemental amounts is base on infant's age/ hours of life. 3. While dad is supplementing infant mom will use the DEBP to help establish her milk supply due to use 20 mm NS. 4. Mom understands NS is for short term use and she will  still attempt to latch infant once daily without NS.  Maternal Data    Feeding Feeding Type: Breast Fed Nipple Type: Slow - flow  LATCH Score Latch: Grasps breast easily, tongue down, lips flanged, rhythmical sucking.  Audible Swallowing: Spontaneous and intermittent  Type of Nipple: Inverted  Comfort (Breast/Nipple): Soft / non-tender  Hold (Positioning): Assistance needed to correctly position infant at breast and maintain latch.  LATCH Score: 7  Interventions Interventions: DEBP;Position options;Support pillows;Adjust position;Breast compression;Assisted with latch;Skin to skin  Lactation Tools Discussed/Used Tools: Nipple Shields Nipple shield size: 20 Shell Type: Inverted (short shafted)   Consult Status Consult Status: Follow-up Date: 11/28/19 Follow-up type: In-patient    Danelle Earthly 11/27/2019, 6:25 PM

## 2019-11-27 NOTE — Progress Notes (Signed)
Patient is eating, ambulating, voiding.  Pain control is good.  Appropriate lochia, no complaints.  Vitals:   11/26/19 2000 11/26/19 2100 11/27/19 0100 11/27/19 0529  BP: 120/71 128/73 132/72 115/63  Pulse: 72 75 78 67  Resp: 18 18 18 18   Temp: 97.8 F (36.6 C) 98.6 F (37 C) 98.5 F (36.9 C) 97.8 F (36.6 C)  TempSrc: Oral Oral Oral Oral  SpO2:      Weight:      Height:        Fundus firm Abd: nontender Ext: no calf tenderness  Lab Results  Component Value Date   WBC 17.4 (H) 11/27/2019   HGB 10.6 (L) 11/27/2019   HCT 32.9 (L) 11/27/2019   MCV 82.7 11/27/2019   PLT 336 11/27/2019    --/--/A POS Performed at [facility], [address], [place], [place]  [phone])  A/P Post partum day 1. Desires circ, risks/benefits/complications and alternatives discussed, pt desires to proceed, questions answered and consent obtained. Scheduled for tomorrow 9am  Routine care.  Expect d/c 7/17.    8/17

## 2019-11-28 NOTE — Progress Notes (Signed)
Pediatrician note states Rubella Immune, GBS negative, RPR NR, HIV negative, Hep B negative

## 2019-11-28 NOTE — Discharge Summary (Signed)
Postpartum Discharge Summary       Patient Name: Cassandra Hill DOB: 1995-12-11 MRN: [id number]  Date of admission: 11/26/2019 Delivery date:11/26/2019  Delivering provider: Allyn Kenner  Date of discharge: 11/28/2019  Admitting diagnosis: Indication for care in labor or delivery [O75.9] Intrauterine pregnancy: [redacted]w[redacted]d    Secondary diagnosis:  Active Problems:   Indication for care in labor or delivery  Additional problems: none    Discharge diagnosis: Term Pregnancy Delivered                                              Post partum procedures:none Augmentation: N/A Complications: None  Hospital course: Onset of Labor With Vaginal Delivery      24y.o. yo [id number] [id number] admitted in Active Labor on 11/26/2019. Patient had an uncomplicated labor course as follows:  Membrane Rupture Time/Date: 4:00 AM ,11/26/2019   Delivery Method:Vaginal, Spontaneous  Episiotomy: None  Lacerations:  1st degree  Patient had an uncomplicated postpartum course.  She is ambulating, tolerating a regular diet, passing flatus, and urinating well. Patient is discharged home in stable condition on 11/28/19.  Newborn Data: Birth date:11/26/2019  Birth time:5:46 PM  Gender:Female  Living status:Living  Apgars:8 ,9  Weight:3675 g   Magnesium Sulfate received: No BMZ received: No Rhophylac:No MMR:No T-DaP:see office record Flu: N/A Transfusion:No  Physical exam  Vitals:   11/27/19 0529 11/27/19 0944 11/27/19 1504 11/28/19 0629  BP: 115/63 130/71 138/79 116/73  Pulse: 67 84 70 61  Resp: '18 18 19 16  '$ Temp: 97.8 F (36.6 C) 98 F (36.7 C) 97.8 F (36.6 C) 97.8 F (36.6 C)  TempSrc: Oral Oral Oral Oral  SpO2:   100% 100%  Weight:      Height:       General: alert, cooperative and no distress Lochia: appropriate Uterine Fundus: firm DVT Evaluation: No evidence of DVT seen on physical exam. Labs: Lab Results  Component Value Date   WBC 17.4 (H) 11/27/2019   HGB 10.6 (L)  11/27/2019   HCT 32.9 (L) 11/27/2019   MCV 82.7 11/27/2019   PLT 336 11/27/2019   CMP Latest Ref Rng & Units 08/06/2019  Glucose 70 - 99 mg/dL 94  BUN 6 - 20 mg/dL 9  Creatinine 0.44 - 1.00 mg/dL 0.46  Sodium 135 - 145 mmol/L 136  Potassium 3.5 - 5.1 mmol/L 4.1  Chloride 98 - 111 mmol/L 105  CO2 22 - 32 mmol/L 22  Calcium 8.9 - 10.3 mg/dL 8.6(L)  Total Protein 6.5 - 8.1 g/dL 6.0(L)  Total Bilirubin 0.3 - 1.2 mg/dL 0.3  Alkaline Phos 38 - 126 U/L 81  AST 15 - 41 U/L 11(L)  ALT 0 - 44 U/L 14   Edinburgh Score: Edinburgh Postnatal Depression Scale Screening Tool 11/27/2019  I have been able to laugh and see the funny side of things. 0  I have looked forward with enjoyment to things. 0  I have blamed myself unnecessarily when things went wrong. 1  I have been anxious or worried for no good reason. 0  I have felt scared or panicky for no good reason. 0  Things have been getting on top of me. 0  I have been so unhappy that I have had difficulty sleeping. 0  I have felt sad or miserable. 0  I have been so unhappy  that I have been crying. 0  The thought of harming myself has occurred to me. 0  Edinburgh Postnatal Depression Scale Total 1      After visit meds:  Allergies as of 11/28/2019   No Known Allergies     Medication List    STOP taking these medications   cyclobenzaprine 10 MG tablet Commonly known as: FLEXERIL   diphenhydramine-acetaminophen 25-500 MG Tabs tablet Commonly known as: TYLENOL PM   lidocaine 2 % solution Commonly known as: XYLOCAINE   pantoprazole 20 MG tablet Commonly known as: PROTONIX   polyvinyl alcohol 1.4 % ophthalmic solution Commonly known as: LIQUIFILM TEARS     TAKE these medications   fluticasone 50 MCG/ACT nasal spray Commonly known as: FLONASE Place 1 spray into both nostrils daily.   loratadine 10 MG tablet Commonly known as: CLARITIN Take 10 mg by mouth daily.   multivitamin-prenatal 27-0.8 MG Tabs tablet Take 1 tablet by  mouth daily at 12 noon.        Discharge home in stable condition Infant Feeding: see peds note Infant Disposition:home with mother Discharge instruction: per After Visit Summary and Postpartum booklet. Activity: Advance as tolerated. Pelvic rest for 6 weeks.  Diet: routine diet Anticipated Birth Control: Unsure Postpartum Appointment:4 weeks Additional Postpartum F/U: Postpartum Depression checkup Future Appointments:No future appointments. Follow up Visit:  Follow-up Information    Allyn Kenner, DO Follow up in 4 week(s).   Specialty: Obstetrics and Gynecology Contact information: [address] [place] [address] [phone]                   11/28/2019 Allyn Kenner, DO

## 2019-11-28 NOTE — Lactation Note (Signed)
This note was copied from a baby's chart. Lactation Consultation Note  Patient Name: Cassandra Hill Date: 11/28/2019 Reason for consult: Follow-up assessment  P2 mother whose infant is now 40 hours old.  This is an ETI at 37+3 weeks.  Mother had no questions/concerns related to breast feeding.  She informed me that, with her first child (now 26 years old) it was much easier.  Her nipples were everted then and now they are inverted.  Mother has been using a manual pump and a NS.  Reassured her and suggested she continue to latch, supplement and pump every three hours.  Mother agreeable to this plan and will continue.  Baby cluster fed last night per mother.  Encouraged hand expression before/after pumping to help with milk supply.  Mother does not have a DEBP for home use.  Educated her on the importance of obtaining a high quality pump to establish and maintain a full milk supply.  Father plans to go buy a pump.  Discussed the option of obtaining a DEBP from the [facility] office and mother will be calling first thing tomorrow morning to determine eligibility.  She is currently a [facility] participant.  Reminded family to take all pump parts with them.  Mother has our OP phone number for questions after discharge.  Family looking forward to being discharged.   Maternal Data    Feeding Feeding Type: Bottle Fed - Formula Nipple Type: Slow - flow  LATCH Score                   Interventions    Lactation Tools Discussed/Used     Consult Status Consult Status: Complete Date: 11/28/19 Follow-up type: Call as needed    Savi Lastinger R Rayhana Slider 11/28/2019, 11:03 AM

## 2020-01-22 ENCOUNTER — Other Ambulatory Visit: Payer: Self-pay

## 2020-01-22 ENCOUNTER — Emergency Department (HOSPITAL_BASED_OUTPATIENT_CLINIC_OR_DEPARTMENT_OTHER)
Admission: EM | Admit: 2020-01-22 | Discharge: 2020-01-22 | Disposition: A | Discharge status: Home / Self Care | Payer: Medicaid Other | Attending: Emergency Medicine | Admitting: Emergency Medicine

## 2020-01-22 ENCOUNTER — Encounter (HOSPITAL_BASED_OUTPATIENT_CLINIC_OR_DEPARTMENT_OTHER): Payer: Self-pay | Admitting: Emergency Medicine

## 2020-01-22 DIAGNOSIS — R05 Cough: Secondary | ICD-10-CM | POA: Diagnosis present

## 2020-01-22 DIAGNOSIS — J069 Acute upper respiratory infection, unspecified: Secondary | ICD-10-CM | POA: Insufficient documentation

## 2020-01-22 DIAGNOSIS — H00015 Hordeolum externum left lower eyelid: Secondary | ICD-10-CM | POA: Diagnosis not present

## 2020-01-22 DIAGNOSIS — Z79899 Other long term (current) drug therapy: Secondary | ICD-10-CM | POA: Insufficient documentation

## 2020-01-22 DIAGNOSIS — Z20822 Contact with and (suspected) exposure to covid-19: Secondary | ICD-10-CM | POA: Insufficient documentation

## 2020-01-22 LAB — SARS CORONAVIRUS 2 BY RT PCR (HOSPITAL ORDER, PERFORMED IN ~~LOC~~ HOSPITAL LAB): SARS Coronavirus 2: NEGATIVE

## 2020-01-22 MED ORDER — BENZONATATE 100 MG PO CAPS: 100.0000 mg | ORAL_CAPSULE | Freq: Three times a day (TID) | ORAL | 0 refills | Status: DC | PRN

## 2020-01-22 NOTE — Discharge Instructions (Addendum)
I prescribed you a medication called Tessalon Perles for your cough.  You can take these up to 3 times a day for management of your cough.  I would continue taking over-the-counter medications for your symptoms.  As we discussed, try drinking hot tea for management of your sore throat and you can then use the warm tea bag to place on the left eye to help bring the stye to a head.  Please stop attempting to drain the stye.  This will only cause it to worsen.  You can always return to the ER with new or worsening symptoms.  It was a pleasure to meet you.

## 2020-01-22 NOTE — ED Provider Notes (Signed)
[facility] EMERGENCY DEPARTMENT Provider Note   CSN: [id number] Arrival date & time: 01/22/20  1135     History Chief Complaint  Patient presents with  . Cough    Cassandra Hill is a 24 y.o. female.  HPI   Patient is a 24 year old female with a medical history as noted below.  Patient states about 3 days ago she began experiencing a mild productive cough as well as congestion, rhinorrhea, mild sore throat.  She states that she has a young child who is also experiencing similar symptoms.  She has had a single dose of the COVID-19 vaccination.  She states this morning she noticed a trace amount of blood in her sputum so she came to the emergency department for further evaluation.  She additionally complains of a small point of pain and irritation to the left lower eyelid.  No discharge.  No visual changes.  No shortness of breath, abdominal pain, nausea, vomiting, diarrhea.     Past Medical History:  Diagnosis Date  . Arthritis   . Back pain   . IBS (irritable bowel syndrome)   . Migraine     Patient Active Problem List   Diagnosis Date Noted  . Indication for care in labor or delivery 11/26/2019    Past Surgical History:  Procedure Laterality Date  . INDUCED ABORTION    . tubes in ears       OB History    Gravida  3   Para  2   Term  2   Preterm      AB  1   Living  2     SAB  0   TAB  1   Ectopic      Multiple  0   Live Births  2           Family History  Problem Relation Age of Onset  . Diabetes Mother   . Hypertension Mother   . Hypertension Father     Social History   Tobacco Use  . Smoking status: Never Smoker  . Smokeless tobacco: Never Used  Vaping Use  . Vaping Use: Never used  Substance Use Topics  . Alcohol use: Yes    Comment: "Every once a while"   . Drug use: Not Currently    Types: Marijuana    Comment: February 2020    Home Medications Prior to Admission medications   Medication Sig Start Date  End Date Taking? Authorizing Provider  fluticasone (FLONASE) 50 MCG/ACT nasal spray Place 1 spray into both nostrils daily. Patient not taking: Reported on 11/26/2019 07/18/18   Janne Napoleon, NP  loratadine (CLARITIN) 10 MG tablet Take 10 mg by mouth daily.    [provider]  Prenatal Vit-Fe Fumarate-FA (MULTIVITAMIN-PRENATAL) 27-0.8 MG TABS tablet Take 1 tablet by mouth daily at 12 noon.    [provider]    Allergies    Patient has no known allergies.  Review of Systems   Review of Systems  Constitutional: Positive for activity change and fatigue. Negative for chills and fever.  HENT: Positive for congestion, postnasal drip, rhinorrhea and sore throat. Negative for trouble swallowing.   Eyes: Positive for pain. Negative for discharge and visual disturbance.  Respiratory: Positive for cough. Negative for shortness of breath.   Cardiovascular: Positive for chest pain (when coughing). Negative for leg swelling.  Gastrointestinal: Negative for abdominal pain, diarrhea, nausea and vomiting.   Physical Exam Updated Vital Signs BP (!) 137/93 (  BP Location: Right Arm)   Pulse 100   Temp 98.8 F (37.1 C) (Oral)   Resp 18   Ht 5\' 1"  (1.549 m)   Wt 98.4 kg   SpO2 100%   BMI 41.00 kg/m   Physical Exam Vitals and nursing note reviewed.  Constitutional:      General: She is not in acute distress.    Appearance: Normal appearance. She is not ill-appearing, toxic-appearing or diaphoretic.  HENT:     Head: Normocephalic and atraumatic.     Right Ear: External ear normal.     Left Ear: External ear normal.     Nose: Nose normal.     Mouth/Throat:     Mouth: Mucous membranes are moist.     Pharynx: Oropharynx is clear. Posterior oropharyngeal erythema present. No oropharyngeal exudate.     Comments: Mild red streaking noted in the posterior oropharynx.  No exudates.  No tonsillar hypertrophy. Eyes:     General: No scleral icterus.       Right eye: No discharge or  hordeolum.        Left eye: Hordeolum present.No discharge.     Extraocular Movements: Extraocular movements intact.     Conjunctiva/sclera: Conjunctivae normal.  Cardiovascular:     Rate and Rhythm: Normal rate and regular rhythm.     Pulses: Normal pulses.     Heart sounds: Normal heart sounds. No murmur heard.  No friction rub. No gallop.   Pulmonary:     Effort: Pulmonary effort is normal. No respiratory distress.     Breath sounds: No stridor. Rhonchi present. No wheezing or rales.     Comments: Diffuse rhonchi noted in the posterior lung fields bilaterally.  Oxygen saturations at 100%.  Patient is speaking clearly and coherently. Abdominal:     General: Abdomen is flat.     Tenderness: There is no abdominal tenderness.  Musculoskeletal:        General: Normal range of motion.     Cervical back: Normal range of motion and neck supple. No tenderness.     Right lower leg: No edema.     Left lower leg: No edema.  Skin:    General: Skin is warm and dry.  Neurological:     General: No focal deficit present.     Mental Status: She is alert and oriented to person, place, and time.  Psychiatric:        Mood and Affect: Mood normal.        Behavior: Behavior normal.     ED Results / Procedures / Treatments   Labs (all labs ordered are listed, but only abnormal results are displayed) Labs Reviewed  SARS CORONAVIRUS 2 BY RT PCR (HOSPITAL ORDER, PERFORMED IN [facility])   EKG None  Radiology No results found.  Procedures Procedures (including critical care time)  Medications Ordered in ED Medications - No data to display  ED Course  I have reviewed the triage vital signs and the nursing notes.  Pertinent labs & imaging results that were available during my care of the patient were reviewed by me and considered in my medical decision making (see chart for details).  Clinical Course as of Jan 22 1420  Sat Jan 22, 2020  1413 SARS Coronavirus 2: NEGATIVE  [LJ]    Clinical Course User Index [LJ] Jan 24, 2020, PA-C   MDM Rules/Calculators/A&P  Pt is a 24 y.o. female that presents with a history, physical exam, and ED Clinical Course as noted above.   Patient presents today with what appears to be symptoms consistent with a viral URI.  Patient has had 1 COVID-19 vaccination.  Her COVID-19 test today is negative.  Recommended continued use of over-the-counter medications.  Will prescribe a short course of Tessalon Perles for her cough, as she has been experiencing refractory symptoms with Delsym.  Recommended hot tea/honey/lemon for management of her sore throat and cough.  Also recommended that she use the warm tea bag to place over the hordeolum on her left lower eyelid.  Urged patient to stop attempting to manually drain the site, as this is likely causing increased swelling and irritation.  Physical exam is otherwise reassuring.  Patient is afebrile.  Not tachycardic.  Oxygen saturations are 100%.  Patient is hemodynamically stable and in NAD at the time of d/c. Evaluation does not show pathology that would require ongoing emergent intervention or inpatient treatment. I explained the diagnosis to the patient. Patient is comfortable with above plan and is stable for discharge at this time. All questions were answered prior to disposition. Strict return precautions for returning to the ED were discussed. Encouraged follow up with PCP.    An After Visit Summary was printed and given to the patient.  Patient discharged to home/self care.  Condition at discharge: Stable  Note: Portions of this report may have been transcribed using voice recognition software. Every effort was made to ensure accuracy; however, inadvertent computerized transcription errors may be present.   Final Clinical Impression(s) / ED Diagnoses Final diagnoses:  Viral URI with cough  Hordeolum externum of left lower eyelid    Rx / DC  Orders ED Discharge Orders         Ordered    benzonatate (TESSALON) 100 MG capsule  3 times daily PRN        01/22/20 1428           Placido Sou, PA-C 01/22/20 1430    Raeford Razor, MD 01/22/20 1641

## 2020-01-22 NOTE — ED Triage Notes (Signed)
Cough and congestion x 3 days

## 2020-02-03 ENCOUNTER — Encounter (HOSPITAL_COMMUNITY): Payer: Self-pay

## 2020-02-03 ENCOUNTER — Emergency Department (HOSPITAL_COMMUNITY)
Admission: EM | Admit: 2020-02-03 | Discharge: 2020-02-03 | Disposition: A | Discharge status: Home / Self Care | Payer: Medicaid Other | Attending: Emergency Medicine | Admitting: Emergency Medicine

## 2020-02-03 DIAGNOSIS — M545 Low back pain: Secondary | ICD-10-CM | POA: Diagnosis present

## 2020-02-03 DIAGNOSIS — L03312 Cellulitis of back [any part except buttock]: Secondary | ICD-10-CM | POA: Diagnosis not present

## 2020-02-03 MED ORDER — BACITRACIN ZINC 500 UNIT/GM EX OINT: 1.0000 "application " | TOPICAL_OINTMENT | Freq: Two times a day (BID) | CUTANEOUS | 0 refills | Status: AC

## 2020-02-03 NOTE — ED Provider Notes (Signed)
[facility]-EMERGENCY DEPT Provider Note   CSN: [id number] Arrival date & time: 02/03/20  1123     History No chief complaint on file.   Cassandra Hill is a 24 y.o. female.  HPI      Cassandra Hill is a 24 y.o. female,  presenting to the ED with an area of swelling and pain to the back noted over the last 3 to 4 days. States she tried to squeeze the area to "pop" it, but was unable to express any fluid. Recent delivery of a baby.  Not breast-feeding. Denies fever/chills, spreading redness, other complaints.     Past Medical History:  Diagnosis Date  . Arthritis   . Back pain   . IBS (irritable bowel syndrome)   . Migraine     Patient Active Problem List   Diagnosis Date Noted  . Indication for care in labor or delivery 11/26/2019    Past Surgical History:  Procedure Laterality Date  . INDUCED ABORTION    . tubes in ears       OB History    Gravida  3   Para  2   Term  2   Preterm      AB  1   Living  2     SAB  0   TAB  1   Ectopic      Multiple  0   Live Births  2           Family History  Problem Relation Age of Onset  . Diabetes Mother   . Hypertension Mother   . Hypertension Father     Social History   Tobacco Use  . Smoking status: Never Smoker  . Smokeless tobacco: Never Used  Vaping Use  . Vaping Use: Never used  Substance Use Topics  . Alcohol use: Yes    Comment: "Every once a while"   . Drug use: Not Currently    Types: Marijuana    Comment: February 2020    Home Medications Prior to Admission medications   Medication Sig Start Date End Date Taking? Authorizing Provider  bacitracin ointment Apply 1 application topically 2 (two) times daily for 5 days. 02/03/20 02/08/20  Jammy Stlouis C, PA-C  benzonatate (TESSALON) 100 MG capsule Take 1 capsule (100 mg total) by mouth 3 (three) times daily as needed for cough. 01/22/20   Placido Sou, PA-C  fluticasone (FLONASE) 50 MCG/ACT nasal  spray Place 1 spray into both nostrils daily. Patient not taking: Reported on 11/26/2019 07/18/18   Janne Napoleon, NP  loratadine (CLARITIN) 10 MG tablet Take 10 mg by mouth daily.    [provider]  Prenatal Vit-Fe Fumarate-FA (MULTIVITAMIN-PRENATAL) 27-0.8 MG TABS tablet Take 1 tablet by mouth daily at 12 noon.    [provider]    Allergies    Patient has no known allergies.  Review of Systems   Review of Systems  Constitutional: Negative for fever.  Skin: Positive for color change.    Physical Exam Updated Vital Signs BP 137/68 (BP Location: Left Arm)   Pulse 80   Temp 98.9 F (37.2 C) (Oral)   Resp 18   LMP 01/20/2020 (Approximate)   SpO2 97%   Physical Exam Vitals and nursing note reviewed.  Constitutional:      General: She is not in acute distress.    Appearance: She is well-developed. She is not diaphoretic.  HENT:     Head: Normocephalic and  atraumatic.  Eyes:     Conjunctiva/sclera: Conjunctivae normal.  Cardiovascular:     Rate and Rhythm: Normal rate and regular rhythm.  Pulmonary:     Effort: Pulmonary effort is normal.  Musculoskeletal:     Cervical back: Neck supple.  Skin:    General: Skin is warm and dry.     Coloration: Skin is not pale.     Comments: Area of erythema, tenderness, and mild swelling to the skin of the right thoracic area of the back approximately 2 cm in diameter. No noted fluctuance.  No rash noted; no pustules or vesicles.  Neurological:     Mental Status: She is alert.  Psychiatric:        Behavior: Behavior normal.     ED Results / Procedures / Treatments   Labs (all labs ordered are listed, but only abnormal results are displayed) Labs Reviewed - No data to display  EKG None  Radiology No results found.  Procedures Ultrasound ED Soft Tissue  Date/Time: 02/03/2020 12:25 PM Performed by: Anselm Pancoast, PA-C Authorized by: Anselm Pancoast, PA-C   Procedure details:    Indications: localization of  abscess and evaluate for cellulitis     Transverse view:  Visualized   Longitudinal view:  Visualized   Images: archived   Location:    Location comment:  Thoracic back   Side:  Right Findings:     no abscess present    cellulitis present   (including critical care time)  Medications Ordered in ED Medications - No data to display  ED Course  I have reviewed the triage vital signs and the nursing notes.  Pertinent labs & imaging results that were available during my care of the patient were reviewed by me and considered in my medical decision making (see chart for details).    MDM Rules/Calculators/A&P                          Patient presents with an area of swelling, pain, and erythema on her back. No clinically significant collection of fluid to indicate drainable abscess. We will treat for isolated, localized cellulitis. The patient was given instructions for home care as well as return precautions. Patient voices understanding of these instructions, accepts the plan, and is comfortable with discharge.   Final Clinical Impression(s) / ED Diagnoses Final diagnoses:  Cellulitis of back except buttock    Rx / DC Orders ED Discharge Orders         Ordered    bacitracin ointment  2 times daily        02/03/20 [address] PA-C 02/03/20 1857    Pricilla Loveless, MD 02/04/20 [phone]

## 2020-02-03 NOTE — Discharge Instructions (Addendum)
  Apply bacitracin ointment twice daily for 5 days.  Antiinflammatory medications: Take 600 mg of ibuprofen every 6 hours or 440 mg (over the counter dose) to 500 mg (prescription dose) of naproxen every 12 hours for the next 3 days. After this time, these medications may be used as needed for pain. Take these medications with food to avoid upset stomach. Choose only one of these medications, do not take them together. Acetaminophen (generic for Tylenol): Should you continue to have additional pain while taking the ibuprofen or naproxen, you may add in acetaminophen as needed. Your daily total maximum amount of acetaminophen from all sources should be limited to 4000mg /day for persons without liver problems, or 2000mg /day for those with liver problems.  Warm compresses: May apply warm compresses intermittently throughout the day.  Sometimes a warm bath will also help.  Prevention: There are some people who have a predisposition to abscess or cellulitis formation, however, there are some things that can be done to prevent abscesses in many people.  Most abscesses form because bacteria that naturally lives on the skin gets trapped underneath the skin.  This can occur through openings too small to see. Before and after any area of skin is shaved, wax, or abraded in any manner, the area should be washed with soap and water and rinsed well.   If you are having trouble with recurrent abscesses, it may be wise to perform a chlorhexidine wash regimen.  For 1 week, wash all of your body with chlorhexidine (available over-the-counter at most pharmacies). You may also need to reevaluate your use of daily soap as soaps with perfumes or dyes can increase the chances of infection in some people.  Follow up: Follow-up with the primary care provider for any further management should symptoms fail to resolve.  Return: Return to the ED sooner should signs of worsening infection arise, such as spreading redness,  worsening puffiness/swelling, severe increase in pain, fever over 100.34F, or any other major issues.  For prescription assistance, may try using prescription discount sites or apps, such as goodrx.com

## 2020-02-03 NOTE — ED Triage Notes (Signed)
Pt presents with c/o cyst on her back x 2 days. Pt denies noticing any drainage from the area.

## 2020-02-22 ENCOUNTER — Encounter (HOSPITAL_COMMUNITY): Payer: Self-pay

## 2020-02-22 ENCOUNTER — Emergency Department (HOSPITAL_COMMUNITY)
Admission: EM | Admit: 2020-02-22 | Discharge: 2020-02-23 | Disposition: A | Discharge status: Home / Self Care | Payer: Medicaid Other | Attending: Emergency Medicine | Admitting: Emergency Medicine

## 2020-02-22 DIAGNOSIS — Z5321 Procedure and treatment not carried out due to patient leaving prior to being seen by health care provider: Secondary | ICD-10-CM | POA: Insufficient documentation

## 2020-02-22 DIAGNOSIS — R109 Unspecified abdominal pain: Secondary | ICD-10-CM | POA: Diagnosis not present

## 2020-02-22 LAB — CBC
HCT: 37.1 % (ref 36.0–46.0)
Hemoglobin: 11.8 g/dL — ABNORMAL LOW (ref 12.0–15.0)
MCH: 26.2 pg (ref 26.0–34.0)
MCHC: 31.8 g/dL (ref 30.0–36.0)
MCV: 82.4 fL (ref 80.0–100.0)
Platelets: 456 10*3/uL — ABNORMAL HIGH (ref 150–400)
RBC: 4.5 MIL/uL (ref 3.87–5.11)
RDW: 11.6 % (ref 11.5–15.5)
WBC: 9.6 10*3/uL (ref 4.0–10.5)
nRBC: 0 % (ref 0.0–0.2)

## 2020-02-22 LAB — URINALYSIS, ROUTINE W REFLEX MICROSCOPIC
Bilirubin Urine: NEGATIVE
Glucose, UA: NEGATIVE mg/dL
Hgb urine dipstick: NEGATIVE
Ketones, ur: NEGATIVE mg/dL
Nitrite: NEGATIVE
Protein, ur: NEGATIVE mg/dL
Specific Gravity, Urine: 1.025 (ref 1.005–1.030)
pH: 5 (ref 5.0–8.0)

## 2020-02-22 LAB — COMPREHENSIVE METABOLIC PANEL
ALT: 17 U/L (ref 0–44)
AST: 12 U/L — ABNORMAL LOW (ref 15–41)
Albumin: 3.5 g/dL (ref 3.5–5.0)
Alkaline Phosphatase: 76 U/L (ref 38–126)
Anion gap: 9 (ref 5–15)
BUN: 11 mg/dL (ref 6–20)
CO2: 23 mmol/L (ref 22–32)
Calcium: 9.1 mg/dL (ref 8.9–10.3)
Chloride: 105 mmol/L (ref 98–111)
Creatinine, Ser: 0.53 mg/dL (ref 0.44–1.00)
GFR, Estimated: >60 mL/min (ref >60)
Glucose, Bld: 82 mg/dL (ref 70–99)
Potassium: 4 mmol/L (ref 3.5–5.1)
Sodium: 137 mmol/L (ref 135–145)
Total Bilirubin: 0.3 mg/dL (ref 0.3–1.2)
Total Protein: 6.1 g/dL — ABNORMAL LOW (ref 6.5–8.1)

## 2020-02-22 LAB — LIPASE, BLOOD: Lipase: 26 U/L (ref 11–51)

## 2020-02-22 LAB — I-STAT BETA HCG BLOOD, ED (MC, WL, AP ONLY): I-stat hCG, quantitative: <5 m[IU]/mL (ref <5)

## 2020-02-22 NOTE — ED Triage Notes (Signed)
Pt presents to ED for left sided abd pain. Denies V/D/N. Last BM today, small amount.

## 2020-02-22 NOTE — ED Notes (Signed)
Pt states they are leaving  

## 2020-11-10 ENCOUNTER — Emergency Department (HOSPITAL_BASED_OUTPATIENT_CLINIC_OR_DEPARTMENT_OTHER): Payer: Medicaid Other

## 2020-11-10 ENCOUNTER — Emergency Department (HOSPITAL_BASED_OUTPATIENT_CLINIC_OR_DEPARTMENT_OTHER)
Admission: EM | Admit: 2020-11-10 | Discharge: 2020-11-10 | Disposition: A | Discharge status: Home / Self Care | Payer: Medicaid Other | Attending: Emergency Medicine | Admitting: Emergency Medicine

## 2020-11-10 ENCOUNTER — Encounter (HOSPITAL_BASED_OUTPATIENT_CLINIC_OR_DEPARTMENT_OTHER): Payer: Self-pay | Admitting: *Deleted

## 2020-11-10 ENCOUNTER — Other Ambulatory Visit: Payer: Self-pay

## 2020-11-10 DIAGNOSIS — E860 Dehydration: Secondary | ICD-10-CM | POA: Insufficient documentation

## 2020-11-10 DIAGNOSIS — U071 COVID-19: Secondary | ICD-10-CM | POA: Insufficient documentation

## 2020-11-10 DIAGNOSIS — R000 Tachycardia, unspecified: Secondary | ICD-10-CM | POA: Insufficient documentation

## 2020-11-10 DIAGNOSIS — R059 Cough, unspecified: Secondary | ICD-10-CM | POA: Diagnosis present

## 2020-11-10 DIAGNOSIS — R112 Nausea with vomiting, unspecified: Secondary | ICD-10-CM

## 2020-11-10 LAB — URINALYSIS, ROUTINE W REFLEX MICROSCOPIC
Bilirubin Urine: NEGATIVE
Glucose, UA: NEGATIVE mg/dL
Ketones, ur: 15 mg/dL — AB
Nitrite: NEGATIVE
Protein, ur: 30 mg/dL — AB
Specific Gravity, Urine: 1.026 (ref 1.005–1.030)
pH: 6 (ref 5.0–8.0)

## 2020-11-10 LAB — CBC WITH DIFFERENTIAL/PLATELET
Abs Immature Granulocytes: 0.04 10*3/uL (ref 0.00–0.07)
Basophils Absolute: 0.1 10*3/uL (ref 0.0–0.1)
Basophils Relative: 1 %
Eosinophils Absolute: 0 10*3/uL (ref 0.0–0.5)
Eosinophils Relative: 0 %
HCT: 37.2 % (ref 36.0–46.0)
Hemoglobin: 12.4 g/dL (ref 12.0–15.0)
Immature Granulocytes: 0 %
Lymphocytes Relative: 4 %
Lymphs Abs: 0.4 10*3/uL — ABNORMAL LOW (ref 0.7–4.0)
MCH: 27.4 pg (ref 26.0–34.0)
MCHC: 33.3 g/dL (ref 30.0–36.0)
MCV: 82.1 fL (ref 80.0–100.0)
Monocytes Absolute: 0.8 10*3/uL (ref 0.1–1.0)
Monocytes Relative: 8 %
Neutro Abs: 8.7 10*3/uL — ABNORMAL HIGH (ref 1.7–7.7)
Neutrophils Relative %: 87 %
Platelets: 330 10*3/uL (ref 150–400)
RBC: 4.53 MIL/uL (ref 3.87–5.11)
RDW: 12.8 % (ref 11.5–15.5)
WBC: 10 10*3/uL (ref 4.0–10.5)
nRBC: 0 % (ref 0.0–0.2)

## 2020-11-10 LAB — COMPREHENSIVE METABOLIC PANEL
ALT: 20 U/L (ref 0–44)
AST: 10 U/L — ABNORMAL LOW (ref 15–41)
Albumin: 4.3 g/dL (ref 3.5–5.0)
Alkaline Phosphatase: 95 U/L (ref 38–126)
Anion gap: 9 (ref 5–15)
BUN: 5 mg/dL — ABNORMAL LOW (ref 6–20)
CO2: 26 mmol/L (ref 22–32)
Calcium: 9.5 mg/dL (ref 8.9–10.3)
Chloride: 103 mmol/L (ref 98–111)
Creatinine, Ser: 0.7 mg/dL (ref 0.44–1.00)
GFR, Estimated: >60 mL/min (ref >60)
Glucose, Bld: 104 mg/dL — ABNORMAL HIGH (ref 70–99)
Potassium: 3.7 mmol/L (ref 3.5–5.1)
Sodium: 138 mmol/L (ref 135–145)
Total Bilirubin: 0.4 mg/dL (ref 0.3–1.2)
Total Protein: 7.2 g/dL (ref 6.5–8.1)

## 2020-11-10 LAB — RESP PANEL BY RT-PCR (FLU A&B, COVID) ARPGX2
Influenza A by PCR: NEGATIVE
Influenza B by PCR: NEGATIVE
SARS Coronavirus 2 by RT PCR: POSITIVE — AB

## 2020-11-10 LAB — LIPASE, BLOOD: Lipase: 10 U/L — ABNORMAL LOW (ref 11–51)

## 2020-11-10 LAB — PREGNANCY, URINE: Preg Test, Ur: NEGATIVE

## 2020-11-10 MED ORDER — GUAIFENESIN-CODEINE 100-10 MG/5ML PO SOLN: 5.0000 mL | Freq: Three times a day (TID) | ORAL | 0 refills | Status: DC | PRN

## 2020-11-10 MED ORDER — SODIUM CHLORIDE 0.9 % IV BOLUS
1000.0000 mL | Freq: Once | INTRAVENOUS | Status: AC
  Administered 2020-11-10: 11:00:00 1000 mL via INTRAVENOUS

## 2020-11-10 MED ORDER — KETOROLAC TROMETHAMINE 30 MG/ML IJ SOLN
30.0000 mg | Freq: Once | INTRAMUSCULAR | Status: AC
  Administered 2020-11-10: 11:00:00 30 mg via INTRAVENOUS
  Filled 2020-11-10: qty 1

## 2020-11-10 MED ORDER — ONDANSETRON 4 MG PO TBDP: 4.0000 mg | ORAL_TABLET | Freq: Three times a day (TID) | ORAL | 0 refills | Status: DC | PRN

## 2020-11-10 MED ORDER — ONDANSETRON HCL 4 MG/2ML IJ SOLN
4.0000 mg | Freq: Once | INTRAMUSCULAR | Status: AC
  Administered 2020-11-10: 11:00:00 4 mg via INTRAVENOUS
  Filled 2020-11-10: qty 2

## 2020-11-10 NOTE — ED Triage Notes (Signed)
Coughing and sore throat for 3 days and vomiting this morning x 5.  Denies fever.

## 2020-11-10 NOTE — ED Provider Notes (Signed)
[facility] EMERGENCY DEPT Provider Note   CSN: [id number] Arrival date & time: 11/10/20  9381     History Chief Complaint  Patient presents with   Cough   Sore Throat    Cassandra Hill is a 25 y.o. female.  Pt presents to the ED today with cough, sore throat, and vomiting.  Pt said she has been feeling ill for 3 days.  She was exposed to a step-daughter who has Covid.  She has been vaccinated, but not boosted.  She had several episodes of n/v this am.  She has not had a fever.      Past Medical History:  Diagnosis Date   Arthritis    Back pain    IBS (irritable bowel syndrome)    Migraine     Patient Active Problem List   Diagnosis Date Noted   Indication for care in labor or delivery 11/26/2019    Past Surgical History:  Procedure Laterality Date   INDUCED ABORTION     tubes in ears       OB History     Gravida  3   Para  2   Term  2   Preterm      AB  1   Living  2      SAB  0   IAB  1   Ectopic      Multiple  0   Live Births  2           Family History  Problem Relation Age of Onset   Diabetes Mother    Hypertension Mother    Hypertension Father     Social History   Tobacco Use   Smoking status: Never   Smokeless tobacco: Never  Vaping Use   Vaping Use: Former  Substance Use Topics   Alcohol use: Yes    Comment: "Every once a while"    Drug use: Not Currently    Types: Marijuana    Comment: February 2020    Home Medications Prior to Admission medications   Medication Sig Start Date End Date Taking? Authorizing Provider  acetaminophen (TYLENOL) 500 MG tablet Take 1,000 mg by mouth every 6 (six) hours as needed. With Rizatriptan   Yes [provider]  buPROPion (WELLBUTRIN XL) 150 MG 24 hr tablet Take 150 mg by mouth daily.   Yes [provider]  guaiFENesin-codeine 100-10 MG/5ML syrup Take 5 mLs by mouth 3 (three) times daily as needed for cough. 11/10/20  Yes Jacalyn Lefevre, MD   hydrOXYzine (ATARAX/VISTARIL) 10 MG tablet Take 10 mg by mouth 3 (three) times daily as needed.   Yes [provider]  methylphenidate 18 MG PO CR tablet Take 18 mg by mouth daily.   Yes [provider]  ondansetron (ZOFRAN ODT) 4 MG disintegrating tablet Take 1 tablet (4 mg total) by mouth every 8 (eight) hours as needed for nausea or vomiting. 11/10/20  Yes Jacalyn Lefevre, MD  rizatriptan (MAXALT) 10 MG tablet Take 10 mg by mouth as needed for migraine. May repeat in 2 hours if needed   Yes [provider]    Allergies    Patient has no known allergies.  Review of Systems   Review of Systems  Constitutional:  Positive for fatigue.  HENT:  Positive for sore throat.   Respiratory:  Positive for cough.   Gastrointestinal:  Positive for nausea and vomiting.  All other systems reviewed and are negative.  Physical Exam Updated Vital  Signs BP 124/76 (BP Location: Right Arm)   Pulse 100   Temp 99.4 F (37.4 C) (Oral)   Resp 13   Ht 5\' 2"  (1.575 m)   Wt 81.6 kg   LMP 09/18/2020 (Approximate)   SpO2 100%   BMI 32.92 kg/m   Physical Exam Vitals and nursing note reviewed.  Constitutional:      Appearance: She is well-developed. She is obese.  HENT:     Head: Normocephalic and atraumatic.     Right Ear: Tympanic membrane and ear canal normal.     Left Ear: Tympanic membrane and ear canal normal.     Mouth/Throat:     Mouth: Mucous membranes are dry.  Eyes:     Conjunctiva/sclera: Conjunctivae normal.     Pupils: Pupils are equal, round, and reactive to light.  Cardiovascular:     Rate and Rhythm: Regular rhythm. Tachycardia present.     Heart sounds: Normal heart sounds.  Pulmonary:     Effort: Pulmonary effort is normal.     Breath sounds: Normal breath sounds.  Abdominal:     General: Bowel sounds are normal.     Palpations: Abdomen is soft.  Musculoskeletal:     Cervical back: Normal range of motion.  Skin:    General: Skin is warm.      Capillary Refill: Capillary refill takes less than 2 seconds.  Neurological:     General: No focal deficit present.     Mental Status: She is alert and oriented to person, place, and time.  Psychiatric:        Mood and Affect: Mood normal.        Behavior: Behavior normal.    ED Results / Procedures / Treatments   Labs (all labs ordered are listed, but only abnormal results are displayed) Labs Reviewed  RESP PANEL BY RT-PCR (FLU A&B, COVID) ARPGX2 - Abnormal; Notable for the following components:      Result Value   SARS Coronavirus 2 by RT PCR POSITIVE (*)    All other components within normal limits  CBC WITH DIFFERENTIAL/PLATELET - Abnormal; Notable for the following components:   Neutro Abs 8.7 (*)    Lymphs Abs 0.4 (*)    All other components within normal limits  COMPREHENSIVE METABOLIC PANEL - Abnormal; Notable for the following components:   Glucose, Bld 104 (*)    BUN 5 (*)    AST 10 (*)    All other components within normal limits  LIPASE, BLOOD - Abnormal; Notable for the following components:   Lipase 10 (*)    All other components within normal limits  URINALYSIS, ROUTINE W REFLEX MICROSCOPIC - Abnormal; Notable for the following components:   APPearance CLOUDY (*)    Hgb urine dipstick TRACE (*)    Ketones, ur 15 (*)    Protein, ur 30 (*)    Leukocytes,Ua TRACE (*)    All other components within normal limits  PREGNANCY, URINE    EKG None  Radiology DG Chest Portable 1 View  Result Date: 11/10/2020 CLINICAL DATA:  Cough and sore throat. EXAM: PORTABLE CHEST 1 VIEW COMPARISON:  Chest x-ray dated October 22, 2017. FINDINGS: The heart size and mediastinal contours are within normal limits. Both lungs are clear. The visualized skeletal structures are unremarkable. IMPRESSION: No active disease. Electronically Signed   By: October 24, 2017 M.D.   On: 11/10/2020 12:03    Procedures Procedures   Medications Ordered in ED Medications  sodium chloride 0.9 %  bolus  1,000 mL (1,000 mLs Intravenous New Bag/Given 11/10/20 1119)  ketorolac (TORADOL) 30 MG/ML injection 30 mg (30 mg Intravenous Given 11/10/20 1121)  ondansetron (ZOFRAN) injection 4 mg (4 mg Intravenous Given 11/10/20 1121)    ED Course  I have reviewed the triage vital signs and the nursing notes.  Pertinent labs & imaging results that were available during my care of the patient were reviewed by me and considered in my medical decision making (see chart for details).    MDM Rules/Calculators/A&P                          Pt is feeling better.  She is Covid +.  She is a low risk for complications, so I don't recommend Paxlovid for her.  She is stable for d/c.  Return if worse.  F/u with pcp. Final Clinical Impression(s) / ED Diagnoses Final diagnoses:  COVID-19  Non-intractable vomiting with nausea, unspecified vomiting type  Dehydration    Rx / DC Orders ED Discharge Orders          Ordered    ondansetron (ZOFRAN ODT) 4 MG disintegrating tablet  Every 8 hours PRN        11/10/20 1222    guaiFENesin-codeine 100-10 MG/5ML syrup  3 times daily PRN        11/10/20 1223             Jacalyn Lefevre, MD 11/10/20 1224

## 2020-11-10 NOTE — Discharge Instructions (Addendum)
Person Under Monitoring Name: Cassandra Hill  Location: [address] [place] [address]   Infection Prevention Recommendations for Individuals Confirmed to have, or Being Evaluated for, 2019 Novel Coronavirus (COVID-19) Infection Who Receive Care at Home  Individuals who are confirmed to have, or are being evaluated for, COVID-19 should follow the prevention steps below until a healthcare provider or local or state health department says they can return to normal activities.  Stay home except to get medical care You should restrict activities outside your home, except for getting medical care. Do not go to work, school, or public areas, and do not use public transportation or taxis.  Call ahead before visiting your doctor Before your medical appointment, call the healthcare provider and tell them that you have, or are being evaluated for, COVID-19 infection. This will help the healthcare provider's office take steps to keep other people from getting infected. Ask your healthcare provider to call the local or state health department.  Monitor your symptoms Seek prompt medical attention if your illness is worsening (e.g., difficulty breathing). Before going to your medical appointment, call the healthcare provider and tell them that you have, or are being evaluated for, COVID-19 infection. Ask your healthcare provider to call the local or state health department.  Wear a facemask You should wear a facemask that covers your nose and mouth when you are in the same room with other people and when you visit a healthcare provider. People who live with or visit you should also wear a facemask while they are in the same room with you.  Separate yourself from other people in your home As much as possible, you should stay in a different room from other people in your home. Also, you should use a separate bathroom, if available.  Avoid sharing household items You should not  share dishes, drinking glasses, cups, eating utensils, towels, bedding, or other items with other people in your home. After using these items, you should wash them thoroughly with soap and water.  Cover your coughs and sneezes Cover your mouth and nose with a tissue when you cough or sneeze, or you can cough or sneeze into your sleeve. Throw used tissues in a lined trash can, and immediately wash your hands with soap and water for at least 20 seconds or use an alcohol-based hand rub.  Wash your [employer] your hands often and thoroughly with soap and water for at least 20 seconds. You can use an alcohol-based hand sanitizer if soap and water are not available and if your hands are not visibly dirty. Avoid touching your eyes, nose, and mouth with unwashed hands.   Prevention Steps for Caregivers and Household Members of Individuals Confirmed to have, or Being Evaluated for, COVID-19 Infection Being Cared for in the Home  If you live with, or provide care at home for, a person confirmed to have, or being evaluated for, COVID-19 infection please follow these guidelines to prevent infection:  Follow healthcare provider's instructions Make sure that you understand and can help the patient follow any healthcare provider instructions for all care.  Provide for the patient's basic needs You should help the patient with basic needs in the home and provide support for getting groceries, prescriptions, and other personal needs.  Monitor the patient's symptoms If they are getting sicker, call his or her medical provider and tell them that the patient has, or is being evaluated for, COVID-19 infection. This will help the healthcare provider's office  take steps to keep other people from getting infected. Ask the healthcare provider to call the local or state health department.  Limit the number of people who have contact with the patient If possible, have only one caregiver for the  patient. Other household members should stay in another home or place of residence. If this is not possible, they should stay in another room, or be separated from the patient as much as possible. Use a separate bathroom, if available. Restrict visitors who do not have an essential need to be in the home.  Keep older adults, very young children, and other sick people away from the patient Keep older adults, very young children, and those who have compromised immune systems or chronic health conditions away from the patient. This includes people with chronic heart, lung, or kidney conditions, diabetes, and cancer.  Ensure good ventilation Make sure that shared spaces in the home have good air flow, such as from an air conditioner or an opened window, weather permitting.  Wash your hands often Wash your hands often and thoroughly with soap and water for at least 20 seconds. You can use an alcohol based hand sanitizer if soap and water are not available and if your hands are not visibly dirty. Avoid touching your eyes, nose, and mouth with unwashed hands. Use disposable paper towels to dry your hands. If not available, use dedicated cloth towels and replace them when they become wet.  Wear a facemask and gloves Wear a disposable facemask at all times in the room and gloves when you touch or have contact with the patient's blood, body fluids, and/or secretions or excretions, such as sweat, saliva, sputum, nasal mucus, vomit, urine, or feces.  Ensure the mask fits over your nose and mouth tightly, and do not touch it during use. Throw out disposable facemasks and gloves after using them. Do not reuse. Wash your hands immediately after removing your facemask and gloves. If your personal clothing becomes contaminated, carefully remove clothing and launder. Wash your hands after handling contaminated clothing. Place all used disposable facemasks, gloves, and other waste in a lined container before  disposing them with other household waste. Remove gloves and wash your hands immediately after handling these items.  Do not share dishes, glasses, or other household items with the patient Avoid sharing household items. You should not share dishes, drinking glasses, cups, eating utensils, towels, bedding, or other items with a patient who is confirmed to have, or being evaluated for, COVID-19 infection. After the person uses these items, you should wash them thoroughly with soap and water.  Wash laundry thoroughly Immediately remove and wash clothes or bedding that have blood, body fluids, and/or secretions or excretions, such as sweat, saliva, sputum, nasal mucus, vomit, urine, or feces, on them. Wear gloves when handling laundry from the patient. Read and follow directions on labels of laundry or clothing items and detergent. In general, wash and dry with the warmest temperatures recommended on the label.  Clean all areas the individual has used often Clean all touchable surfaces, such as counters, tabletops, doorknobs, bathroom fixtures, toilets, phones, keyboards, tablets, and bedside tables, every day. Also, clean any surfaces that may have blood, body fluids, and/or secretions or excretions on them. Wear gloves when cleaning surfaces the patient has come in contact with. Use a diluted bleach solution (e.g., dilute bleach with 1 part bleach and 10 parts water) or a household disinfectant with a label that says EPA-registered for coronaviruses. To make a bleach  solution at home, add 1 tablespoon of bleach to 1 quart (4 cups) of water. For a larger supply, add  cup of bleach to 1 gallon (16 cups) of water. Read labels of cleaning products and follow recommendations provided on product labels. Labels contain instructions for safe and effective use of the cleaning product including precautions you should take when applying the product, such as wearing gloves or eye protection and making sure you  have good ventilation during use of the product. Remove gloves and wash hands immediately after cleaning.  Monitor yourself for signs and symptoms of illness Caregivers and household members are considered close contacts, should monitor their health, and will be asked to limit movement outside of the home to the extent possible. Follow the monitoring steps for close contacts listed on the symptom monitoring form.   ? If you have additional questions, contact your local health department or call the epidemiologist on call at [phone] (available 24/7). ? This guidance is subject to change. For the most up-to-date guidance from [facility], please refer to their website: YouBlogs.pl

## 2020-12-17 ENCOUNTER — Encounter (HOSPITAL_BASED_OUTPATIENT_CLINIC_OR_DEPARTMENT_OTHER): Payer: Self-pay | Admitting: Emergency Medicine

## 2020-12-17 ENCOUNTER — Other Ambulatory Visit: Payer: Self-pay

## 2020-12-17 ENCOUNTER — Emergency Department (HOSPITAL_BASED_OUTPATIENT_CLINIC_OR_DEPARTMENT_OTHER)
Admission: EM | Admit: 2020-12-17 | Discharge: 2020-12-17 | Disposition: A | Discharge status: Home / Self Care | Payer: Medicaid Other | Attending: Emergency Medicine | Admitting: Emergency Medicine

## 2020-12-17 DIAGNOSIS — R197 Diarrhea, unspecified: Secondary | ICD-10-CM | POA: Insufficient documentation

## 2020-12-17 DIAGNOSIS — R1013 Epigastric pain: Secondary | ICD-10-CM | POA: Diagnosis present

## 2020-12-17 DIAGNOSIS — Z87891 Personal history of nicotine dependence: Secondary | ICD-10-CM | POA: Insufficient documentation

## 2020-12-17 LAB — COMPREHENSIVE METABOLIC PANEL
ALT: 14 U/L (ref 0–44)
AST: 10 U/L — ABNORMAL LOW (ref 15–41)
Albumin: 4.5 g/dL (ref 3.5–5.0)
Alkaline Phosphatase: 88 U/L (ref 38–126)
Anion gap: 7 (ref 5–15)
BUN: 14 mg/dL (ref 6–20)
CO2: 28 mmol/L (ref 22–32)
Calcium: 9.5 mg/dL (ref 8.9–10.3)
Chloride: 102 mmol/L (ref 98–111)
Creatinine, Ser: 0.68 mg/dL (ref 0.44–1.00)
GFR, Estimated: >60 mL/min (ref >60)
Glucose, Bld: 101 mg/dL — ABNORMAL HIGH (ref 70–99)
Potassium: 4.2 mmol/L (ref 3.5–5.1)
Sodium: 137 mmol/L (ref 135–145)
Total Bilirubin: 0.5 mg/dL (ref 0.3–1.2)
Total Protein: 7.5 g/dL (ref 6.5–8.1)

## 2020-12-17 LAB — URINALYSIS, ROUTINE W REFLEX MICROSCOPIC
Bilirubin Urine: NEGATIVE
Glucose, UA: NEGATIVE mg/dL
Ketones, ur: NEGATIVE mg/dL
Nitrite: NEGATIVE
Protein, ur: NEGATIVE mg/dL
Specific Gravity, Urine: 1.026 (ref 1.005–1.030)
pH: 5 (ref 5.0–8.0)

## 2020-12-17 LAB — CBC
HCT: 41.8 % (ref 36.0–46.0)
Hemoglobin: 13.7 g/dL (ref 12.0–15.0)
MCH: 26.9 pg (ref 26.0–34.0)
MCHC: 32.8 g/dL (ref 30.0–36.0)
MCV: 82.1 fL (ref 80.0–100.0)
Platelets: 442 10*3/uL — ABNORMAL HIGH (ref 150–400)
RBC: 5.09 MIL/uL (ref 3.87–5.11)
RDW: 12.4 % (ref 11.5–15.5)
WBC: 12.5 10*3/uL — ABNORMAL HIGH (ref 4.0–10.5)
nRBC: 0 % (ref 0.0–0.2)

## 2020-12-17 LAB — LIPASE, BLOOD: Lipase: 18 U/L (ref 11–51)

## 2020-12-17 LAB — PREGNANCY, URINE: Preg Test, Ur: NEGATIVE

## 2020-12-17 MED ORDER — ALUM & MAG HYDROXIDE-SIMETH 200-200-20 MG/5ML PO SUSP
30.0000 mL | Freq: Once | ORAL | Status: AC
  Administered 2020-12-17: 30 mL via ORAL
  Filled 2020-12-17: qty 30

## 2020-12-17 MED ORDER — SODIUM CHLORIDE 0.9 % IV BOLUS
1000.0000 mL | Freq: Once | INTRAVENOUS | Status: AC
  Administered 2020-12-17: 1000 mL via INTRAVENOUS

## 2020-12-17 NOTE — ED Provider Notes (Signed)
[facility] EMERGENCY DEPARTMENT Provider Note  CSN: [id number] Arrival date & time: 12/17/20 1104    History Chief Complaint  Patient presents with   Abdominal Pain    Cassandra Hill is a 25 y.o. female with history of IBS reports she was in her usual state of health yesterday. Began having diarrhea and moderate cramping epigastric pain this morning. Has not had a fever, no vomiting, no blood in stools. Her pain is improved since arrival.    Past Medical History:  Diagnosis Date   Arthritis    Back pain    IBS (irritable bowel syndrome)    Migraine     Past Surgical History:  Procedure Laterality Date   INDUCED ABORTION     tubes in ears      Family History  Problem Relation Age of Onset   Diabetes Mother    Hypertension Mother    Hypertension Father     Social History   Tobacco Use   Smoking status: Never   Smokeless tobacco: Never  Vaping Use   Vaping Use: Former  Substance Use Topics   Alcohol use: Yes    Comment: "Every once a while"    Drug use: Not Currently    Types: Marijuana    Comment: February 2020     Home Medications Prior to Admission medications   Medication Sig Start Date End Date Taking? Authorizing Provider  acetaminophen (TYLENOL) 500 MG tablet Take 1,000 mg by mouth every 6 (six) hours as needed. With Rizatriptan    [provider]  buPROPion (WELLBUTRIN XL) 150 MG 24 hr tablet Take 150 mg by mouth daily.    [provider]  guaiFENesin-codeine 100-10 MG/5ML syrup Take 5 mLs by mouth 3 (three) times daily as needed for cough. 11/10/20   Jacalyn Lefevre, MD  hydrOXYzine (ATARAX/VISTARIL) 10 MG tablet Take 10 mg by mouth 3 (three) times daily as needed.    [provider]  methylphenidate 18 MG PO CR tablet Take 18 mg by mouth daily.    [provider]  ondansetron (ZOFRAN ODT) 4 MG disintegrating tablet Take 1 tablet (4 mg total) by mouth every 8 (eight) hours as needed for nausea or  vomiting. 11/10/20   Jacalyn Lefevre, MD  rizatriptan (MAXALT) 10 MG tablet Take 10 mg by mouth as needed for migraine. May repeat in 2 hours if needed    [provider]     Allergies    Patient has no known allergies.   Review of Systems   Review of Systems A comprehensive review of systems was completed and negative except as noted in HPI.    Physical Exam BP 123/76   Pulse 64   Temp 98.8 F (37.1 C) (Oral)   Resp 16   Ht 5\' 2"  (1.575 m)   Wt 81.6 kg   LMP 12/07/2020 (Within Days)   SpO2 100%   BMI 32.92 kg/m   Physical Exam Vitals and nursing note reviewed.  Constitutional:      Appearance: Normal appearance.  HENT:     Head: Normocephalic and atraumatic.     Nose: Nose normal.     Mouth/Throat:     Mouth: Mucous membranes are moist.  Eyes:     Extraocular Movements: Extraocular movements intact.     Conjunctiva/sclera: Conjunctivae normal.  Cardiovascular:     Rate and Rhythm: Normal rate.  Pulmonary:     Effort: Pulmonary effort is normal.     Breath sounds: Normal  breath sounds.  Abdominal:     General: Abdomen is flat.     Palpations: Abdomen is soft.     Tenderness: There is no abdominal tenderness. There is no guarding or rebound. Negative signs include Murphy's sign and McBurney's sign.  Musculoskeletal:        General: No swelling. Normal range of motion.     Cervical back: Neck supple.  Skin:    General: Skin is warm and dry.  Neurological:     General: No focal deficit present.     Mental Status: She is alert.  Psychiatric:        Mood and Affect: Mood normal.     ED Results / Procedures / Treatments   Labs (all labs ordered are listed, but only abnormal results are displayed) Labs Reviewed  COMPREHENSIVE METABOLIC PANEL - Abnormal; Notable for the following components:      Result Value   Glucose, Bld 101 (*)    AST 10 (*)    All other components within normal limits  CBC - Abnormal; Notable for the following components:    WBC 12.5 (*)    Platelets 442 (*)    All other components within normal limits  URINALYSIS, ROUTINE W REFLEX MICROSCOPIC - Abnormal; Notable for the following components:   Hgb urine dipstick TRACE (*)    Leukocytes,Ua SMALL (*)    All other components within normal limits  LIPASE, BLOOD  PREGNANCY, URINE    EKG None   Radiology No results found.  Procedures Procedures  Medications Ordered in the ED Medications  alum & mag hydroxide-simeth (MAALOX/MYLANTA) 200-200-20 MG/5ML suspension 30 mL (30 mLs Oral Given 12/17/20 1316)  sodium chloride 0.9 % bolus 1,000 mL (1,000 mLs Intravenous New Bag/Given 12/17/20 1316)     MDM Rules/Calculators/A&P MDM Patient with diarrhea and mild epigastric pain without peritoneal signs on exam. Neg Murphy's. Labs unremarkable except for mild leukocytosis. Will give GI cocktail, IVF and reassess.   ED Course  I have reviewed the triage vital signs and the nursing notes.  Pertinent labs & imaging results that were available during my care of the patient were reviewed by me and considered in my medical decision making (see chart for details).  Clinical Course as of 12/17/20 1405  Sun Dec 17, 2020  1403 Patient reports she is feeling better, tolerating PO fluids. No further diarrhea. Recommend OTC antidiarrheals. RTED for any other concerns.  [CS]    Clinical Course User Index [CS] Pollyann Savoy, MD    Final Clinical Impression(s) / ED Diagnoses Final diagnoses:  Diarrhea, unspecified type  Epigastric pain    Rx / DC Orders ED Discharge Orders     None        Pollyann Savoy, MD 12/17/20 [phone]

## 2020-12-17 NOTE — ED Triage Notes (Addendum)
Pt presents to ED POV. Pt c/o abd pain, n/d. Pt reports that pain is in upper quadrants. Hx IBS

## 2021-01-09 ENCOUNTER — Encounter (HOSPITAL_BASED_OUTPATIENT_CLINIC_OR_DEPARTMENT_OTHER): Payer: Self-pay | Admitting: *Deleted

## 2021-01-09 ENCOUNTER — Emergency Department (HOSPITAL_BASED_OUTPATIENT_CLINIC_OR_DEPARTMENT_OTHER)
Admission: EM | Admit: 2021-01-09 | Discharge: 2021-01-09 | Disposition: A | Discharge status: Home / Self Care | Payer: Medicaid Other | Attending: Emergency Medicine | Admitting: Emergency Medicine

## 2021-01-09 ENCOUNTER — Other Ambulatory Visit: Payer: Self-pay

## 2021-01-09 DIAGNOSIS — R1012 Left upper quadrant pain: Secondary | ICD-10-CM | POA: Insufficient documentation

## 2021-01-09 DIAGNOSIS — R112 Nausea with vomiting, unspecified: Secondary | ICD-10-CM | POA: Diagnosis not present

## 2021-01-09 DIAGNOSIS — K8020 Calculus of gallbladder without cholecystitis without obstruction: Secondary | ICD-10-CM | POA: Diagnosis not present

## 2021-01-09 DIAGNOSIS — R1010 Upper abdominal pain, unspecified: Secondary | ICD-10-CM

## 2021-01-09 LAB — URINALYSIS, ROUTINE W REFLEX MICROSCOPIC
Bilirubin Urine: NEGATIVE
Glucose, UA: NEGATIVE mg/dL
Hgb urine dipstick: NEGATIVE
Ketones, ur: NEGATIVE mg/dL
Nitrite: NEGATIVE
Specific Gravity, Urine: 1.024 (ref 1.005–1.030)
pH: 7 (ref 5.0–8.0)

## 2021-01-09 LAB — CBC
HCT: 36.7 % (ref 36.0–46.0)
Hemoglobin: 12.2 g/dL (ref 12.0–15.0)
MCH: 27.2 pg (ref 26.0–34.0)
MCHC: 33.2 g/dL (ref 30.0–36.0)
MCV: 81.7 fL (ref 80.0–100.0)
Platelets: 404 10*3/uL — ABNORMAL HIGH (ref 150–400)
RBC: 4.49 MIL/uL (ref 3.87–5.11)
RDW: 12.5 % (ref 11.5–15.5)
WBC: 14.5 10*3/uL — ABNORMAL HIGH (ref 4.0–10.5)
nRBC: 0 % (ref 0.0–0.2)

## 2021-01-09 LAB — COMPREHENSIVE METABOLIC PANEL
ALT: 16 U/L (ref 0–44)
AST: 11 U/L — ABNORMAL LOW (ref 15–41)
Albumin: 4.3 g/dL (ref 3.5–5.0)
Alkaline Phosphatase: 79 U/L (ref 38–126)
Anion gap: 8 (ref 5–15)
BUN: 15 mg/dL (ref 6–20)
CO2: 25 mmol/L (ref 22–32)
Calcium: 9.3 mg/dL (ref 8.9–10.3)
Chloride: 103 mmol/L (ref 98–111)
Creatinine, Ser: 0.83 mg/dL (ref 0.44–1.00)
GFR, Estimated: >60 mL/min (ref >60)
Glucose, Bld: 107 mg/dL — ABNORMAL HIGH (ref 70–99)
Potassium: 3.8 mmol/L (ref 3.5–5.1)
Sodium: 136 mmol/L (ref 135–145)
Total Bilirubin: 0.4 mg/dL (ref 0.3–1.2)
Total Protein: 6.8 g/dL (ref 6.5–8.1)

## 2021-01-09 LAB — PREGNANCY, URINE: Preg Test, Ur: NEGATIVE

## 2021-01-09 LAB — LIPASE, BLOOD: Lipase: 18 U/L (ref 11–51)

## 2021-01-09 MED ORDER — SODIUM CHLORIDE 0.9 % IV SOLN: 1000.0000 mL | INTRAVENOUS | Status: DC

## 2021-01-09 MED ORDER — MORPHINE SULFATE (PF) 4 MG/ML IV SOLN
4.0000 mg | Freq: Once | INTRAVENOUS | Status: AC
  Administered 2021-01-09: 4 mg via INTRAVENOUS
  Filled 2021-01-09: qty 1

## 2021-01-09 MED ORDER — SODIUM CHLORIDE 0.9 % IV BOLUS (SEPSIS)
1000.0000 mL | Freq: Once | INTRAVENOUS | Status: AC
  Administered 2021-01-09: 1000 mL via INTRAVENOUS

## 2021-01-09 MED ORDER — ONDANSETRON 4 MG PO TBDP: 4.0000 mg | ORAL_TABLET | Freq: Three times a day (TID) | ORAL | 0 refills | Status: DC | PRN

## 2021-01-09 MED ORDER — METOCLOPRAMIDE HCL 5 MG/ML IJ SOLN
10.0000 mg | Freq: Once | INTRAMUSCULAR | Status: AC
  Administered 2021-01-09: 10 mg via INTRAVENOUS
  Filled 2021-01-09: qty 2

## 2021-01-09 NOTE — ED Notes (Signed)
Patient verbalizes understanding of discharge instructions. Opportunity for questioning and answers were provided. Patient discharged from ED.  °

## 2021-01-09 NOTE — ED Provider Notes (Signed)
[facility] EMERGENCY DEPT Provider Note  CSN: [id number] Arrival date & time: 01/09/21 0204  Chief Complaint(s) Abdominal Pain  HPI Cassandra Hill is a 25 y.o. female with a past medical history listed below who presents to the emergency department with upper abdominal pain that began immediately after eating this evening. Several hours later pain worsened and she began having nausea and nonbloody nonbilious emesis. No known sick contacts.  Possible suspicious food intake.  No recent travel.  Pain worse with emesis.  No alleviating factors.  No diarrhea.  No recent fevers or infections.  No coughing or congestion.   Abdominal Pain  Past Medical History Past Medical History:  Diagnosis Date   Arthritis    Back pain    IBS (irritable bowel syndrome)    Migraine    Patient Active Problem List   Diagnosis Date Noted   Indication for care in labor or delivery 11/26/2019   Home Medication(s) Prior to Admission medications   Medication Sig Start Date End Date Taking? Authorizing Provider  acetaminophen (TYLENOL) 500 MG tablet Take 1,000 mg by mouth every 6 (six) hours as needed. With Rizatriptan    [provider]  buPROPion (WELLBUTRIN XL) 150 MG 24 hr tablet Take 150 mg by mouth daily.    [provider]  guaiFENesin-codeine 100-10 MG/5ML syrup Take 5 mLs by mouth 3 (three) times daily as needed for cough. 11/10/20   Jacalyn Lefevre, MD  hydrOXYzine (ATARAX/VISTARIL) 10 MG tablet Take 10 mg by mouth 3 (three) times daily as needed.    [provider]  methylphenidate 18 MG PO CR tablet Take 18 mg by mouth daily.    [provider]  ondansetron (ZOFRAN ODT) 4 MG disintegrating tablet Take 1 tablet (4 mg total) by mouth every 8 (eight) hours as needed for nausea or vomiting. 11/10/20   Jacalyn Lefevre, MD  rizatriptan (MAXALT) 10 MG tablet Take 10 mg by mouth as needed for migraine. May repeat in 2 hours if needed    [provider]                                                                                                                                    Past Surgical History Past Surgical History:  Procedure Laterality Date   INDUCED ABORTION     tubes in ears     Family History Family History  Problem Relation Age of Onset   Diabetes Mother    Hypertension Mother    Hypertension Father     Social History Social History   Tobacco Use   Smoking status: Never   Smokeless tobacco: Never  Vaping Use   Vaping Use: Former  Substance Use Topics   Alcohol use: Yes    Comment: "Every once a while"    Drug use: Not Currently    Types: Marijuana    Comment: February 2020   Allergies Patient  has no known allergies.  Review of Systems Review of Systems  Gastrointestinal:  Positive for abdominal pain.  All other systems are reviewed and are negative for acute change except as noted in the HPI  Physical Exam Vital Signs  I have reviewed the triage vital signs BP 121/80   Pulse 68   Temp 98.3 F (36.8 C)   Resp 17   SpO2 99%   Physical Exam Vitals reviewed.  Constitutional:      General: She is not in acute distress.    Appearance: She is well-developed. She is not diaphoretic.  HENT:     Head: Normocephalic and atraumatic.     Right Ear: External ear normal.     Left Ear: External ear normal.     Nose: Nose normal.  Eyes:     General: No scleral icterus.    Conjunctiva/sclera: Conjunctivae normal.  Neck:     Trachea: Phonation normal.  Cardiovascular:     Rate and Rhythm: Normal rate and regular rhythm.  Pulmonary:     Effort: Pulmonary effort is normal. No respiratory distress.     Breath sounds: No stridor.  Abdominal:     General: There is no distension.     Tenderness: There is abdominal tenderness in the epigastric area and left upper quadrant. There is no guarding or rebound. Negative signs include Murphy's sign and McBurney's sign.  Musculoskeletal:         General: Normal range of motion.     Cervical back: Normal range of motion.  Neurological:     Mental Status: She is alert and oriented to person, place, and time.  Psychiatric:        Behavior: Behavior normal.    ED Results and Treatments Labs (all labs ordered are listed, but only abnormal results are displayed) Labs Reviewed  COMPREHENSIVE METABOLIC PANEL - Abnormal; Notable for the following components:      Result Value   Glucose, Bld 107 (*)    AST 11 (*)    All other components within normal limits  CBC - Abnormal; Notable for the following components:   WBC 14.5 (*)    Platelets 404 (*)    All other components within normal limits  URINALYSIS, ROUTINE W REFLEX MICROSCOPIC - Abnormal; Notable for the following components:   APPearance HAZY (*)    Protein, ur TRACE (*)    Leukocytes,Ua MODERATE (*)    All other components within normal limits  LIPASE, BLOOD  PREGNANCY, URINE                                                                                                                         EKG  EKG Interpretation  Date/Time:    Ventricular Rate:    PR Interval:    QRS Duration:   QT Interval:    QTC Calculation:   R Axis:     Text Interpretation:         Radiology No results  found.  Pertinent labs & imaging results that were available during my care of the patient were reviewed by me and considered in my medical decision making (see MDM for details).  Medications Ordered in ED Medications  sodium chloride 0.9 % bolus 1,000 mL (1,000 mLs Intravenous New Bag/Given 01/09/21 0549)    Followed by  0.9 %  sodium chloride infusion (has no administration in time range)  metoCLOPramide (REGLAN) injection 10 mg (10 mg Intravenous Given 01/09/21 0554)  morphine 4 MG/ML injection 4 mg (4 mg Intravenous Given 01/09/21 0554)                                                                                                                                      Procedures Ultrasound ED Abd  Date/Time: 01/09/2021 7:14 AM Performed by: Nira Conn, MD Authorized by: Nira Conn, MD   Procedure details:    Indications: abdominal pain     Assessment for:  Gallstones   Hepatobiliary:  Visualized        Hepatobiliary findings:    Common bile duct:  Normal   Gallbladder wall:  Normal   Gallbladder stones: identified     Intra-abdominal fluid: not identified     Sonographic Murphy's sign: negative    (including critical care time)  Medical Decision Making / ED Course I have reviewed the nursing notes for this encounter and the patient's prior records (if available in EHR or on provided paperwork).  CORTNIE RINGEL was evaluated in Emergency Department on 01/09/2021 for the symptoms described in the history of present illness. She was evaluated in the context of the global COVID-19 pandemic, which necessitated consideration that the patient might be at risk for infection with the SARS-CoV-2 virus that causes COVID-19. Institutional protocols and algorithms that pertain to the evaluation of patients at risk for COVID-19 are in a state of rapid change based on information released by regulatory bodies including the CDC and federal and state organizations. These policies and algorithms were followed during the patient's care in the ED.     Upper abdominal pain with nausea and vomiting. Possible suspicious food intake. Epigastric and left upper quadrant tenderness to palpation.  Pertinent labs & imaging results that were available during my care of the patient were reviewed by me and considered in my medical decision making:  Labs notable for leukocytosis. No anemia. No significant electrolyte derangements or renal sufficiency. No evidence of bili obstruction or pancreatitis UA without evidence of infection. Urine pregnancy negative.  Bedside ultrasound notable for cholelithiasis without evidence of acute  cholecystitis. Patient treated symptomatically with IV fluids, antiemetics and pain medicine. Pain resolved. She was able to tolerate oral intake. Repeat abdominal exam benign.  Low suspicion for serious intra-abdominal inflammatory/infectious process or bowel obstruction requiring imaging at this time.    Final Clinical Impression(s) / ED Diagnoses Final diagnoses:  None   The patient appears  reasonably screened and/or stabilized for discharge and I doubt any other medical condition or other [facility] requiring further screening, evaluation, or treatment in the ED at this time prior to discharge. Safe for discharge with strict return precautions.  Disposition: Discharge  Condition: Good  I have discussed the results, Dx and Tx plan with the patient/family who expressed understanding and agree(s) with the plan. Discharge instructions discussed at length. The patient/family was given strict return precautions who verbalized understanding of the instructions. No further questions at time of discharge.    ED Discharge Orders          Ordered    ondansetron (ZOFRAN ODT) 4 MG disintegrating tablet  Every 8 hours PRN        01/09/21 [id number]            Follow Up: Surgery  Call  to schedule an appointment for close follow up  Inc, [facility] [address] [place] [address] [phone]  Call  as needed     This chart was dictated using voice recognition software.  Despite best efforts to proofread,  errors can occur which can change the documentation meaning.    Nira Connardama, Kiearra Oyervides Eduardo, MD 01/09/21 [phone]

## 2021-01-09 NOTE — ED Triage Notes (Signed)
Generalized abdominal pain started tonight after dinner. Vomited x 4

## 2021-04-09 ENCOUNTER — Encounter (HOSPITAL_BASED_OUTPATIENT_CLINIC_OR_DEPARTMENT_OTHER): Payer: Self-pay | Admitting: Emergency Medicine

## 2021-04-09 ENCOUNTER — Emergency Department (HOSPITAL_BASED_OUTPATIENT_CLINIC_OR_DEPARTMENT_OTHER)
Admission: EM | Admit: 2021-04-09 | Discharge: 2021-04-09 | Disposition: A | Discharge status: Home / Self Care | Payer: Medicaid Other | Attending: Emergency Medicine | Admitting: Emergency Medicine

## 2021-04-09 ENCOUNTER — Emergency Department (HOSPITAL_BASED_OUTPATIENT_CLINIC_OR_DEPARTMENT_OTHER): Payer: Medicaid Other

## 2021-04-09 ENCOUNTER — Other Ambulatory Visit: Payer: Self-pay

## 2021-04-09 DIAGNOSIS — Z8616 Personal history of COVID-19: Secondary | ICD-10-CM | POA: Diagnosis not present

## 2021-04-09 DIAGNOSIS — R059 Cough, unspecified: Secondary | ICD-10-CM | POA: Diagnosis present

## 2021-04-09 DIAGNOSIS — J069 Acute upper respiratory infection, unspecified: Secondary | ICD-10-CM | POA: Diagnosis not present

## 2021-04-09 DIAGNOSIS — Z20822 Contact with and (suspected) exposure to covid-19: Secondary | ICD-10-CM | POA: Diagnosis not present

## 2021-04-09 DIAGNOSIS — R051 Acute cough: Secondary | ICD-10-CM

## 2021-04-09 LAB — RESP PANEL BY RT-PCR (FLU A&B, COVID) ARPGX2
Influenza A by PCR: NEGATIVE
Influenza B by PCR: NEGATIVE
SARS Coronavirus 2 by RT PCR: NEGATIVE

## 2021-04-09 MED ORDER — AZITHROMYCIN 250 MG PO TABS: 250.0000 mg | ORAL_TABLET | Freq: Every day | ORAL | 0 refills | Status: DC

## 2021-04-09 MED ORDER — DEXAMETHASONE 4 MG PO TABS
10.0000 mg | ORAL_TABLET | Freq: Once | ORAL | Status: AC
  Administered 2021-04-09: 10:00:00 10 mg via ORAL
  Filled 2021-04-09: qty 3

## 2021-04-09 MED ORDER — ALBUTEROL SULFATE HFA 108 (90 BASE) MCG/ACT IN AERS
4.0000 | INHALATION_SPRAY | Freq: Once | RESPIRATORY_TRACT | Status: AC
  Administered 2021-04-09: 09:00:00 4 via RESPIRATORY_TRACT
  Filled 2021-04-09: qty 6.7

## 2021-04-09 NOTE — ED Notes (Signed)
Patient verbalizes understanding of discharge instructions. Opportunity for questioning and answers were provided. Patient discharged from ED.  °

## 2021-04-09 NOTE — ED Triage Notes (Signed)
Pt arrives to ED with c/o of cough x2 weeks. Intermittently productive. No sore throat.

## 2021-04-09 NOTE — ED Provider Notes (Signed)
[facility] [facility] EMERGENCY DEPT Provider Note   CSN: [id number] Arrival date & time: 04/09/21  [id number]     History Chief Complaint  Patient presents with   Cough    Cassandra Hill is a 25 y.o. female.  The history is provided by the patient.  Cough Cough characteristics:  Non-productive Sputum characteristics:  Nondescript Severity:  Moderate Onset quality:  Gradual Duration:  2 weeks Timing:  Intermittent Progression:  Waxing and waning Chronicity:  New Context: sick contacts and upper respiratory infection   Relieved by:  Nothing Worsened by:  Nothing Associated symptoms: wheezing   Associated symptoms: no chest pain, no chills, no diaphoresis, no ear pain, no fever, no rash, no shortness of breath, no sinus congestion and no sore throat       Past Medical History:  Diagnosis Date   Arthritis    Back pain    IBS (irritable bowel syndrome)    Migraine     Patient Active Problem List   Diagnosis Date Noted   Indication for care in labor or delivery 11/26/2019    Past Surgical History:  Procedure Laterality Date   INDUCED ABORTION     tubes in ears       OB History     Gravida  3   Para  2   Term  2   Preterm      AB  1   Living  2      SAB  0   IAB  1   Ectopic      Multiple  0   Live Births  2           Family History  Problem Relation Age of Onset   Diabetes Mother    Hypertension Mother    Hypertension Father     Social History   Tobacco Use   Smoking status: Never   Smokeless tobacco: Never  Vaping Use   Vaping Use: Former  Substance Use Topics   Alcohol use: Yes    Comment: "Every once a while"    Drug use: Not Currently    Types: Marijuana    Comment: February 2020    Home Medications Prior to Admission medications   Medication Sig Start Date End Date Taking? Authorizing Provider  azithromycin (ZITHROMAX) 250 MG tablet Take 1 tablet (250 mg total) by mouth daily. Take first 2 tablets  together, then 1 every day until finished. 04/09/21  Yes Shardee Dieu, DO  acetaminophen (TYLENOL) 500 MG tablet Take 1,000 mg by mouth every 6 (six) hours as needed. With Rizatriptan    [provider]  buPROPion (WELLBUTRIN XL) 150 MG 24 hr tablet Take 150 mg by mouth daily.    [provider]  guaiFENesin-codeine 100-10 MG/5ML syrup Take 5 mLs by mouth 3 (three) times daily as needed for cough. 11/10/20   Isla Pence, MD  hydrOXYzine (ATARAX/VISTARIL) 10 MG tablet Take 10 mg by mouth 3 (three) times daily as needed.    [provider]  methylphenidate 18 MG PO CR tablet Take 18 mg by mouth daily.    [provider]  ondansetron (ZOFRAN ODT) 4 MG disintegrating tablet Take 1 tablet (4 mg total) by mouth every 8 (eight) hours as needed for nausea or vomiting. 01/09/21   Cardama, Grayce Sessions, MD  rizatriptan (MAXALT) 10 MG tablet Take 10 mg by mouth as needed for migraine. May repeat in 2 hours if needed    [provider]  Allergies    Patient has no known allergies.  Review of Systems   Review of Systems  Constitutional:  Negative for chills, diaphoresis and fever.  HENT:  Negative for ear pain and sore throat.   Eyes:  Negative for pain and visual disturbance.  Respiratory:  Positive for cough and wheezing. Negative for shortness of breath.   Cardiovascular:  Negative for chest pain and palpitations.  Gastrointestinal:  Negative for abdominal pain and vomiting.  Genitourinary:  Negative for dysuria and hematuria.  Musculoskeletal:  Negative for arthralgias and back pain.  Skin:  Negative for color change and rash.  Neurological:  Negative for seizures and syncope.  All other systems reviewed and are negative.  Physical Exam Updated Vital Signs BP 130/80 (BP Location: Left Arm)   Pulse 88   Temp 98.1 F (36.7 C) (Oral)   Resp 16   Ht 5\' 2"  (1.575 m)   Wt 81.6 kg   SpO2 100%   BMI 32.92 kg/m   Physical Exam Vitals and  nursing note reviewed.  Constitutional:      General: She is not in acute distress.    Appearance: She is well-developed. She is not ill-appearing.  HENT:     Head: Normocephalic and atraumatic.     Nose: Nose normal.     Mouth/Throat:     Mouth: Mucous membranes are moist.  Eyes:     Extraocular Movements: Extraocular movements intact.     Conjunctiva/sclera: Conjunctivae normal.     Pupils: Pupils are equal, round, and reactive to light.  Cardiovascular:     Rate and Rhythm: Normal rate and regular rhythm.     Pulses: Normal pulses.     Heart sounds: Normal heart sounds. No murmur heard. Pulmonary:     Effort: Pulmonary effort is normal. No respiratory distress.     Breath sounds: Wheezing present.  Abdominal:     Palpations: Abdomen is soft.     Tenderness: There is no abdominal tenderness.  Musculoskeletal:        General: No swelling.     Cervical back: Normal range of motion and neck supple.  Skin:    General: Skin is warm and dry.     Capillary Refill: Capillary refill takes less than 2 seconds.  Neurological:     General: No focal deficit present.     Mental Status: She is alert.  Psychiatric:        Mood and Affect: Mood normal.    ED Results / Procedures / Treatments   Labs (all labs ordered are listed, but only abnormal results are displayed) Labs Reviewed  RESP PANEL BY RT-PCR (FLU A&B, COVID) [id number]    EKG None  Radiology DG Chest Portable 1 View  Result Date: 04/09/2021 CLINICAL DATA:  Cough EXAM: PORTABLE CHEST 1 VIEW COMPARISON:  10/11/2020 FINDINGS: Normal heart size and mediastinal contours. No acute infiltrate or edema. No effusion or pneumothorax. No acute osseous findings. IMPRESSION: No active disease. Electronically Signed   By: Jorje Guild M.D.   On: 04/09/2021 09:37    Procedures Procedures   Medications Ordered in ED Medications  dexamethasone (DECADRON) tablet 10 mg (has no administration in time range)  albuterol (VENTOLIN HFA)  108 (90 Base) MCG/ACT inhaler 4 puff (4 puffs Inhalation Given 04/09/21 0919)    ED Course  I have reviewed the triage vital signs and the nursing notes.  Pertinent labs & imaging results that were available during my care of the patient were reviewed  by me and considered in my medical decision making (see chart for details).    MDM Rules/Calculators/A&P                           Cassandra Hill is here with cough and viral symptoms for the last 2 weeks.  No fever.  Normal vitals.  Recent sick contacts.  Mild wheezing throughout on exam.  Normal work of breathing.  Will get chest x-ray to evaluate for pneumonia.  Will treat with albuterol for wheezing.  Will check for COVID and flu.  Viral testing negative.  Chest x-ray negative for obvious pneumonia.  Will treat with albuterol, Decadron, Z-Pak.  Overall suspect bronchitis.  Discharged in good condition.  Understands return precautions.  This chart was dictated using voice recognition software.  Despite best efforts to proofread,  errors can occur which can change the documentation meaning.   Final Clinical Impression(s) / ED Diagnoses Final diagnoses:  Acute cough  Upper respiratory tract infection, unspecified type    Rx / DC Orders ED Discharge Orders          Ordered    azithromycin (ZITHROMAX) 250 MG tablet  Daily        04/09/21 0956             Virgina Norfolk, DO 04/09/21 [phone]

## 2021-04-09 NOTE — Discharge Instructions (Addendum)
You have been treated with a long-acting steroid.  Recommend using your inhaler 2 puffs every 4 hours as needed.  I have called you in an antibiotic however your chest x-ray shows no obvious pneumonia.  Follow-up with your primary care doctor.

## 2021-05-12 ENCOUNTER — Encounter (HOSPITAL_BASED_OUTPATIENT_CLINIC_OR_DEPARTMENT_OTHER): Payer: Self-pay | Admitting: Emergency Medicine

## 2021-05-12 ENCOUNTER — Other Ambulatory Visit: Payer: Self-pay

## 2021-05-12 ENCOUNTER — Emergency Department (HOSPITAL_BASED_OUTPATIENT_CLINIC_OR_DEPARTMENT_OTHER)
Admission: EM | Admit: 2021-05-12 | Discharge: 2021-05-12 | Disposition: A | Discharge status: Home / Self Care | Payer: Medicaid Other | Attending: Emergency Medicine | Admitting: Emergency Medicine

## 2021-05-12 DIAGNOSIS — L03011 Cellulitis of right finger: Secondary | ICD-10-CM | POA: Diagnosis present

## 2021-05-12 DIAGNOSIS — L02511 Cutaneous abscess of right hand: Secondary | ICD-10-CM | POA: Diagnosis not present

## 2021-05-12 DIAGNOSIS — Z79899 Other long term (current) drug therapy: Secondary | ICD-10-CM | POA: Diagnosis not present

## 2021-05-12 DIAGNOSIS — L0291 Cutaneous abscess, unspecified: Secondary | ICD-10-CM

## 2021-05-12 MED ORDER — LIDOCAINE HCL 2 % IJ SOLN
15.0000 mL | Freq: Once | INTRAMUSCULAR | Status: AC
  Administered 2021-05-12: 300 mg
  Filled 2021-05-12: qty 20

## 2021-05-12 MED ORDER — SULFAMETHOXAZOLE-TRIMETHOPRIM 800-160 MG PO TABS: 1.0000 | ORAL_TABLET | Freq: Two times a day (BID) | ORAL | 0 refills | Status: AC

## 2021-05-12 MED ORDER — SULFAMETHOXAZOLE-TRIMETHOPRIM 800-160 MG PO TABS
1.0000 | ORAL_TABLET | Freq: Once | ORAL | Status: AC
  Administered 2021-05-12: 1 via ORAL
  Filled 2021-05-12: qty 1

## 2021-05-12 NOTE — ED Triage Notes (Signed)
Pt c/o R thumb pain. Pt states there was a bump on Tuesday on her R thumb that she tried to break, area continued to enlarge and increase in pain. Redness and swelling noted to area. Denies injury to area.

## 2021-05-12 NOTE — ED Provider Notes (Signed)
[facility] EMERGENCY DEPT Provider Note   CSN: [id number] Arrival date & time: 05/12/21  1324     History Chief Complaint  Patient presents with   Finger Injury    Cassandra Hill is a 25 y.o. female who presents to the emergency department with a right finger infection has been there for the last couple of days.  She does have a history of similar episodes in that same finger in the past however these have resolved on their own.  Pain is been constant and worsening and she rates it moderate severity.  Area has associated erythema, swelling, and warmth.  No fever or chills.  HPI     Past Medical History:  Diagnosis Date   Arthritis    Back pain    IBS (irritable bowel syndrome)    Migraine     Patient Active Problem List   Diagnosis Date Noted   Indication for care in labor or delivery 11/26/2019    Past Surgical History:  Procedure Laterality Date   INDUCED ABORTION     tubes in ears       OB History     Gravida  3   Para  2   Term  2   Preterm      AB  1   Living  2      SAB  0   IAB  1   Ectopic      Multiple  0   Live Births  2           Family History  Problem Relation Age of Onset   Diabetes Mother    Hypertension Mother    Hypertension Father     Social History   Tobacco Use   Smoking status: Never   Smokeless tobacco: Never  Vaping Use   Vaping Use: Former  Substance Use Topics   Alcohol use: Yes    Comment: "Every once a while"    Drug use: Not Currently    Types: Marijuana    Comment: February 2020    Home Medications Prior to Admission medications   Medication Sig Start Date End Date Taking? Authorizing Provider  sulfamethoxazole-trimethoprim (BACTRIM DS) 800-160 MG tablet Take 1 tablet by mouth 2 (two) times daily for 5 days. 05/12/21 05/17/21 Yes Eron Staat M, PA-C  acetaminophen (TYLENOL) 500 MG tablet Take 1,000 mg by mouth every 6 (six) hours as needed. With Rizatriptan    [provider]  azithromycin (ZITHROMAX) 250 MG tablet Take 1 tablet (250 mg total) by mouth daily. Take first 2 tablets together, then 1 every day until finished. 04/09/21   Curatolo, Adam, DO  buPROPion (WELLBUTRIN XL) 150 MG 24 hr tablet Take 150 mg by mouth daily.    [provider]  guaiFENesin-codeine 100-10 MG/5ML syrup Take 5 mLs by mouth 3 (three) times daily as needed for cough. 11/10/20   Jacalyn Lefevre, MD  hydrOXYzine (ATARAX/VISTARIL) 10 MG tablet Take 10 mg by mouth 3 (three) times daily as needed.    [provider]  methylphenidate 18 MG PO CR tablet Take 18 mg by mouth daily.    [provider]  ondansetron (ZOFRAN ODT) 4 MG disintegrating tablet Take 1 tablet (4 mg total) by mouth every 8 (eight) hours as needed for nausea or vomiting. 01/09/21   Cardama, Amadeo Garnet, MD  rizatriptan (MAXALT) 10 MG tablet Take 10 mg by mouth as needed for migraine. May repeat in 2 hours if needed  [provider]    Allergies    Patient has no known allergies.  Review of Systems   Review of Systems  All other systems reviewed and are negative.  Physical Exam Updated Vital Signs BP 121/86 (BP Location: Right Arm)    Pulse 67    Temp 98.4 F (36.9 C) (Oral)    Resp 18    Ht 5\' 4"  (1.626 m)    Wt 76.2 kg    SpO2 100%    BMI 28.84 kg/m   Physical Exam Vitals and nursing note reviewed.  Constitutional:      Appearance: Normal appearance.  HENT:     Head: Normocephalic and atraumatic.  Eyes:     General:        Right eye: No discharge.        Left eye: No discharge.     Conjunctiva/sclera: Conjunctivae normal.  Pulmonary:     Effort: Pulmonary effort is normal.  Skin:    General: Skin is warm and dry.     Findings: No rash.     Comments: Area of fluctuance, erythema, and warmth over the dorsal aspect of the right thumb.  Patient has full strength to flexion and extension without pain.  Good cap refill.  2+ radial pulse in the right wrist.   Neurological:     General: No focal deficit present.     Mental Status: She is alert.  Psychiatric:        Mood and Affect: Mood normal.        Behavior: Behavior normal.    ED Results / Procedures / Treatments   Labs (all labs ordered are listed, but only abnormal results are displayed) Labs Reviewed - No data to display  EKG None  Radiology No results found.  Procedures . Incision and Drainage  Date/Time: 05/12/2021 7:52 PM Performed by: 05/14/2021, PA-C Authorized by: Teressa Lower, PA-C   Consent:    Consent obtained:  Verbal   Consent given by:  Patient   Risks, benefits, and alternatives were discussed: yes     Risks discussed:  Bleeding, incomplete drainage and pain Universal protocol:    Procedure explained and questions answered to patient or proxy's satisfaction: yes     Relevant documents present and verified: yes     Test results available : no     Imaging studies available: no     Required blood products, implants, devices, and special equipment available: no     Site/side marked: yes     Patient identity confirmed:  Verbally with patient and arm band Location:    Type:  Abscess   Size:  4cm   Location:  Upper extremity   Upper extremity location:  Finger   Finger location:  R thumb Pre-procedure details:    Skin preparation:  Povidone-iodine Sedation:    Sedation type:  None Anesthesia:    Anesthesia method:  Local infiltration   Local anesthetic:  Lidocaine 2% w/o epi Procedure type:    Complexity:  Complex Procedure details:    Ultrasound guidance: no     Needle aspiration: no     Incision types:  Single straight   Incision depth:  Dermal   Wound management:  Extensive cleaning   Drainage:  Bloody and purulent   Drainage amount:  Scant   Wound treatment:  Wound left open   Packing materials:  None Post-procedure details:    Procedure completion:  Tolerated well, no immediate complications   Medications  Ordered in  ED Medications  sulfamethoxazole-trimethoprim (BACTRIM DS) 800-160 MG per tablet 1 tablet (has no administration in time range)  lidocaine (XYLOCAINE) 2 % (with pres) injection 300 mg (300 mg Infiltration Given 05/12/21 1957)    ED Course  I have reviewed the triage vital signs and the nursing notes.  Pertinent labs & imaging results that were available during my care of the patient were reviewed by me and considered in my medical decision making (see chart for details).    MDM Rules/Calculators/A&P                          Cassandra Hill is a 25 y.o. female who presents the emergency department with likely abscess.  No obvious systemic symptoms.  Vital signs are normal.  We will likely perform I&D at bedside.  Scant amount of material was obtained during the I&D.  See procedure note above.  Given that there is warmth, erythema, and swelling to the finger I will cover her with antibiotics likely Bactrim.  Patient is amenable this plan.  Strict return precautions given.  At this moment do not feel that she has any evidence of felon finger, necrotizing fasciitis, or systemic symptoms.  Again her vitals are normal.  She is safe for discharge. I will have her follow up with her PCP.     Final Clinical Impression(s) / ED Diagnoses Final diagnoses:  Abscess    Rx / DC Orders ED Discharge Orders          Ordered    sulfamethoxazole-trimethoprim (BACTRIM DS) 800-160 MG tablet  2 times daily        05/12/21 2001             Teressa Lower, Cordelia Poche 05/12/21 2004    [address], DO 05/12/21 2152

## 2021-05-12 NOTE — Discharge Instructions (Signed)
Please take antibiotics twice daily for the next 5 days.  Please follow-up with your primary care doctor next week to ensure you are on the right direction.  Please use warm compresses several times per day to promote drainage.  Please return to the emergency department if you experience worsening symptoms, worsening finger pain, fever, chills, other concern you might have.

## 2021-05-12 NOTE — ED Notes (Signed)
Presents with rt thumb redness and swelling, very tender to touch, painful, states has normal sensation, denies any fevers, states issue began this past Tuesday. I&D tray at bedside

## 2021-06-20 ENCOUNTER — Other Ambulatory Visit: Payer: Self-pay

## 2021-06-20 ENCOUNTER — Encounter (HOSPITAL_BASED_OUTPATIENT_CLINIC_OR_DEPARTMENT_OTHER): Payer: Self-pay

## 2021-06-20 ENCOUNTER — Emergency Department (HOSPITAL_BASED_OUTPATIENT_CLINIC_OR_DEPARTMENT_OTHER)
Admission: EM | Admit: 2021-06-20 | Discharge: 2021-06-20 | Disposition: A | Discharge status: Home / Self Care | Payer: Medicaid Other | Attending: Emergency Medicine | Admitting: Emergency Medicine

## 2021-06-20 DIAGNOSIS — H1032 Unspecified acute conjunctivitis, left eye: Secondary | ICD-10-CM | POA: Insufficient documentation

## 2021-06-20 DIAGNOSIS — H5712 Ocular pain, left eye: Secondary | ICD-10-CM | POA: Diagnosis present

## 2021-06-20 MED ORDER — ERYTHROMYCIN 5 MG/GM OP OINT: TOPICAL_OINTMENT | OPHTHALMIC | 0 refills | Status: DC

## 2021-06-20 MED ORDER — TETRACAINE HCL 0.5 % OP SOLN
1.0000 [drp] | Freq: Once | OPHTHALMIC | Status: AC
  Administered 2021-06-20: 1 [drp] via OPHTHALMIC
  Filled 2021-06-20: qty 4

## 2021-06-20 MED ORDER — FLUORESCEIN SODIUM 1 MG OP STRP
1.0000 | ORAL_STRIP | Freq: Once | OPHTHALMIC | Status: AC
  Administered 2021-06-20: 1 via OPHTHALMIC
  Filled 2021-06-20: qty 1

## 2021-06-20 NOTE — ED Triage Notes (Signed)
Patient here POV from Home with Eye Pain.  States She awoke this AM with Redness, Irritation, and Swelling to Left Eye. Swelling has since worsened.  OTC Eyedrops ineffective at Home. Endorses Blurry Vision to Same.   NAD Noted during Triage. A&Ox4. GCS 15. Ambulatory.

## 2021-06-20 NOTE — ED Provider Notes (Signed)
[facility] EMERGENCY DEPT Provider Note   CSN: [id number] Arrival date & time: 06/20/21  1238     History  Chief Complaint  Patient presents with   Eye Pain    Cassandra Hill is a 26 y.o. female.  The history is provided by the patient. No language interpreter was used.  Eye Pain   26 year old female with history of migraine presenting with complaint of eye pain.  Patient reports she woke up this morning and noticing redness to her left eye with some crust formation around the eye.  Throughout the day she noticed increasing crust formation in her left eyelid with some mild irritation.  She denies any change in vision no pain with eye movement no foreign body sensation in the eye and she denies any injury.  No fever congestion headache.  She does not wear contact lens.  No recent sick contacts.  No specific treatment tried.  Home Medications Prior to Admission medications   Medication Sig Start Date End Date Taking? Authorizing Provider  acetaminophen (TYLENOL) 500 MG tablet Take 1,000 mg by mouth every 6 (six) hours as needed. With Rizatriptan    [provider]  azithromycin (ZITHROMAX) 250 MG tablet Take 1 tablet (250 mg total) by mouth daily. Take first 2 tablets together, then 1 every day until finished. 04/09/21   Curatolo, Adam, DO  buPROPion (WELLBUTRIN XL) 150 MG 24 hr tablet Take 150 mg by mouth daily.    [provider]  guaiFENesin-codeine 100-10 MG/5ML syrup Take 5 mLs by mouth 3 (three) times daily as needed for cough. 11/10/20   Jacalyn Lefevre, MD  hydrOXYzine (ATARAX/VISTARIL) 10 MG tablet Take 10 mg by mouth 3 (three) times daily as needed.    [provider]  methylphenidate 18 MG PO CR tablet Take 18 mg by mouth daily.    [provider]  ondansetron (ZOFRAN ODT) 4 MG disintegrating tablet Take 1 tablet (4 mg total) by mouth every 8 (eight) hours as needed for nausea or vomiting. 01/09/21   Cardama, Amadeo Garnet,  MD  rizatriptan (MAXALT) 10 MG tablet Take 10 mg by mouth as needed for migraine. May repeat in 2 hours if needed    [provider]      Allergies    Patient has no known allergies.    Review of Systems   Review of Systems  HENT:  Positive for ear discharge.   Eyes:  Positive for pain and redness. Negative for photophobia.   Physical Exam Updated Vital Signs BP 132/76 (BP Location: Right Arm)    Pulse 65    Temp 98.2 F (36.8 C)    Resp 16    Ht 5\' 4"  (1.626 m)    Wt 76.2 kg    SpO2 100%    BMI 28.84 kg/m  Physical Exam Vitals and nursing note reviewed.  Constitutional:      General: She is not in acute distress.    Appearance: She is well-developed.  HENT:     Head: Atraumatic.  Eyes:     General: Lids are normal. Lids are everted, no foreign bodies appreciated. Vision grossly intact. Gaze aligned appropriately.        Right eye: No foreign body or discharge.        Left eye: Discharge present.No foreign body.     Intraocular pressure: Left eye pressure is 16 mmHg. Measurements were taken using a handheld tonometer.    Extraocular Movements: Extraocular movements intact.  Conjunctiva/sclera:     Right eye: Right conjunctiva is not injected. No chemosis or exudate.    Left eye: Left conjunctiva is injected. No chemosis or exudate.    Pupils: Pupils are equal, round, and reactive to light.     Left eye: Pupil is round, reactive and not sluggish. No corneal abrasion or fluorescein uptake. Seidel exam negative.    Slit lamp exam:    Left eye: Anterior chamber quiet.  Pulmonary:     Effort: Pulmonary effort is normal.  Musculoskeletal:     Cervical back: Neck supple.  Skin:    Findings: No rash.  Neurological:     Mental Status: She is alert.  Psychiatric:        Mood and Affect: Mood normal.    ED Results / Procedures / Treatments   Labs (all labs ordered are listed, but only abnormal results are displayed) Labs Reviewed - No data to  display  EKG None  Radiology No results found.  Procedures Procedures    Medications Ordered in ED Medications  fluorescein ophthalmic strip 1 strip (1 strip Left Eye Given 06/20/21 1634)  tetracaine (PONTOCAINE) 0.5 % ophthalmic solution 1 drop (1 drop Left Eye Given 06/20/21 1634)    ED Course/ Medical Decision Making/ A&P                           Medical Decision Making Risk Prescription drug management.   BP 132/76 (BP Location: Right Arm)    Pulse 65    Temp 98.2 F (36.8 C)    Resp 16    Ht 5\' 4"  (1.626 m)    Wt 76.2 kg    SpO2 100%    BMI 28.84 kg/m   4:19 PM This is a 26 year old female who presents with complaints of left eye redness and irritation with crust formation.  She does not wear any contact lenses.  She denies any recent injury.  On exam she has a mildly injected left conjunctiva but normal eyelids and no visual changes.  Extraocular movement is intact and pupils equal round reactive  4:53 PM Visual acuity obtained and unremarkable. Patient has normal intraocular pressure, low suspicion for acute angle glaucoma.  She has limbic sparing conjunctivitis therefore low suspicion for iritis, uveitis.  No foreign body noted on exam.  No signs of corneal abrasion and no evidence of trauma.  Plan to prescribe erythromycin ointment as treatment for suspected bacterial contact otitis due to the presence of crust buildup.  I have low suspicion for STI source, fungal source, or herpes.  This patient presents to the ED for concern of eye redness, this involves an extensive number of treatment options, and is a complaint that carries with it a high risk of complications and morbidity.  The differential diagnosis includes conjunctivitis (bacterial/viral/allergic), iritis, uveitis, keratitis, acute angle glaucoma, corneal abrasion          Final Clinical Impression(s) / ED Diagnoses Final diagnoses:  Acute conjunctivitis of left eye, unspecified acute conjunctivitis  type    Rx / DC Orders ED Discharge Orders     None         34, PA-C 06/20/21 1700    08/18/21, MD 06/21/21 1501

## 2021-07-07 ENCOUNTER — Encounter (HOSPITAL_BASED_OUTPATIENT_CLINIC_OR_DEPARTMENT_OTHER): Payer: Self-pay | Admitting: Emergency Medicine

## 2021-07-07 ENCOUNTER — Other Ambulatory Visit: Payer: Self-pay

## 2021-07-07 ENCOUNTER — Emergency Department (HOSPITAL_BASED_OUTPATIENT_CLINIC_OR_DEPARTMENT_OTHER)
Admission: EM | Admit: 2021-07-07 | Discharge: 2021-07-07 | Disposition: A | Discharge status: Home / Self Care | Payer: Medicaid Other | Attending: Emergency Medicine | Admitting: Emergency Medicine

## 2021-07-07 ENCOUNTER — Emergency Department (HOSPITAL_BASED_OUTPATIENT_CLINIC_OR_DEPARTMENT_OTHER): Payer: Medicaid Other

## 2021-07-07 DIAGNOSIS — H02846 Edema of left eye, unspecified eyelid: Secondary | ICD-10-CM | POA: Diagnosis not present

## 2021-07-07 DIAGNOSIS — L089 Local infection of the skin and subcutaneous tissue, unspecified: Secondary | ICD-10-CM | POA: Insufficient documentation

## 2021-07-07 DIAGNOSIS — R519 Headache, unspecified: Secondary | ICD-10-CM | POA: Diagnosis present

## 2021-07-07 DIAGNOSIS — R600 Localized edema: Secondary | ICD-10-CM

## 2021-07-07 LAB — CBC WITH DIFFERENTIAL/PLATELET
Abs Immature Granulocytes: 0.03 10*3/uL (ref 0.00–0.07)
Basophils Absolute: 0.1 10*3/uL (ref 0.0–0.1)
Basophils Relative: 1 %
Eosinophils Absolute: 0.3 10*3/uL (ref 0.0–0.5)
Eosinophils Relative: 2 %
HCT: 37.7 % (ref 36.0–46.0)
Hemoglobin: 12.4 g/dL (ref 12.0–15.0)
Immature Granulocytes: 0 %
Lymphocytes Relative: 21 %
Lymphs Abs: 2.4 10*3/uL (ref 0.7–4.0)
MCH: 27.5 pg (ref 26.0–34.0)
MCHC: 32.9 g/dL (ref 30.0–36.0)
MCV: 83.6 fL (ref 80.0–100.0)
Monocytes Absolute: 0.9 10*3/uL (ref 0.1–1.0)
Monocytes Relative: 8 %
Neutro Abs: 7.9 10*3/uL — ABNORMAL HIGH (ref 1.7–7.7)
Neutrophils Relative %: 68 %
Platelets: 384 10*3/uL (ref 150–400)
RBC: 4.51 MIL/uL (ref 3.87–5.11)
RDW: 12.6 % (ref 11.5–15.5)
WBC: 11.5 10*3/uL — ABNORMAL HIGH (ref 4.0–10.5)
nRBC: 0 % (ref 0.0–0.2)

## 2021-07-07 LAB — HCG, SERUM, QUALITATIVE: Preg, Serum: NEGATIVE

## 2021-07-07 LAB — BASIC METABOLIC PANEL
Anion gap: 6 (ref 5–15)
BUN: 14 mg/dL (ref 6–20)
CO2: 26 mmol/L (ref 22–32)
Calcium: 9.2 mg/dL (ref 8.9–10.3)
Chloride: 106 mmol/L (ref 98–111)
Creatinine, Ser: 0.65 mg/dL (ref 0.44–1.00)
GFR, Estimated: >60 mL/min (ref >60)
Glucose, Bld: 100 mg/dL — ABNORMAL HIGH (ref 70–99)
Potassium: 4.2 mmol/L (ref 3.5–5.1)
Sodium: 138 mmol/L (ref 135–145)

## 2021-07-07 MED ORDER — DOXYCYCLINE HYCLATE 100 MG PO CAPS: 100.0000 mg | ORAL_CAPSULE | Freq: Two times a day (BID) | ORAL | 0 refills | Status: DC

## 2021-07-07 MED ORDER — IOHEXOL 300 MG/ML  SOLN
100.0000 mL | Freq: Once | INTRAMUSCULAR | Status: AC | PRN
  Administered 2021-07-07: 100 mL via INTRAVENOUS

## 2021-07-07 MED ORDER — DOXYCYCLINE HYCLATE 100 MG PO TABS
100.0000 mg | ORAL_TABLET | Freq: Once | ORAL | Status: AC
  Administered 2021-07-07: 100 mg via ORAL
  Filled 2021-07-07: qty 1

## 2021-07-07 NOTE — ED Triage Notes (Signed)
Pt from home c/o a cyst or bump over her left eye for the past 3 days. Pt states that it is painful to touch.

## 2021-07-07 NOTE — ED Provider Notes (Addendum)
[name] EMERGENCY DEPT Provider Note   CSN: [id number] Arrival date & time: 07/07/21  [id number]     History  Chief Complaint  Patient presents with   Cyst    Cassandra Hill is a 26 y.o. female.  Pt is a 26 yo. Female with no significant PMH presenting for forehead pain and swelling after infected acne pustule. Admits to left sided ocular pain, swelling, and vision changes.   The history is provided by the patient. No language interpreter was used.      Home Medications Prior to Admission medications   Medication Sig Start Date End Date Taking? Authorizing Provider  doxycycline (VIBRAMYCIN) 100 MG capsule Take 1 capsule (100 mg total) by mouth 2 (two) times daily. [id number]  Yes Campbell Stall P, DO  acetaminophen (TYLENOL) 500 MG tablet Take 1,000 mg by mouth every 6 (six) hours as needed. With Rizatriptan    [provider]  azithromycin (ZITHROMAX) 250 MG tablet Take 1 tablet (250 mg total) by mouth daily. Take first 2 tablets together, then 1 every day until finished. 04/09/21   Curatolo, Adam, DO  buPROPion (WELLBUTRIN XL) 150 MG 24 hr tablet Take 150 mg by mouth daily.    [provider]  erythromycin ophthalmic ointment Place a 1/2 inch ribbon of ointment into the left lower eyelid BID x 5 days 06/20/21   Domenic Moras, PA-C  guaiFENesin-codeine 100-10 MG/5ML syrup Take 5 mLs by mouth 3 (three) times daily as needed for cough. 11/10/20   Isla Pence, MD  hydrOXYzine (ATARAX/VISTARIL) 10 MG tablet Take 10 mg by mouth 3 (three) times daily as needed.    [provider]  methylphenidate 18 MG PO CR tablet Take 18 mg by mouth daily.    [provider]  ondansetron (ZOFRAN ODT) 4 MG disintegrating tablet Take 1 tablet (4 mg total) by mouth every 8 (eight) hours as needed for nausea or vomiting. 01/09/21   Cardama, Grayce Sessions, MD  rizatriptan (MAXALT) 10 MG tablet Take 10 mg by mouth as needed for migraine. May repeat in 2 hours if  needed    [provider]      Allergies    Patient has no known allergies.    Review of Systems   Review of Systems  Constitutional:  Negative for chills and fever.  HENT:  Positive for facial swelling. Negative for ear pain and sore throat.   Eyes:  Positive for pain and visual disturbance.  Respiratory:  Negative for cough and shortness of breath.   Cardiovascular:  Negative for chest pain and palpitations.  Gastrointestinal:  Negative for abdominal pain and vomiting.  Genitourinary:  Negative for dysuria and hematuria.  Musculoskeletal:  Negative for arthralgias and back pain.  Skin:  Positive for wound. Negative for color change and rash.  Neurological:  Negative for seizures and syncope.  All other systems reviewed and are negative.  Physical Exam Updated Vital Signs BP 114/62 (BP Location: Left Arm)    Pulse 63    Temp 98.5 F (36.9 C) (Oral)    Resp 18    Ht 5\' 2"  (1.575 m)    Wt 72.6 kg    SpO2 100%    BMI 29.26 kg/m  Physical Exam Vitals and nursing note reviewed.  Constitutional:      General: She is not in acute distress.    Appearance: She is well-developed.  HENT:     Head: Normocephalic and atraumatic.  Eyes:     General:  Vision grossly intact.     Conjunctiva/sclera: Conjunctivae normal.     Pupils: Pupils are equal, round, and reactive to light.     Visual Fields: Right eye visual fields normal and left eye visual fields normal.     Comments: No abnormal EOM. Pain with EOM present. Reported visual changes. Gross vision intact on exam.   Cardiovascular:     Rate and Rhythm: Normal rate and regular rhythm.     Heart sounds: No murmur heard. Pulmonary:     Effort: Pulmonary effort is normal. No respiratory distress.     Breath sounds: Normal breath sounds.  Abdominal:     Palpations: Abdomen is soft.     Tenderness: There is no abdominal tenderness.  Musculoskeletal:        General: No swelling.     Cervical back: Neck supple.  Skin:     General: Skin is warm and dry.     Capillary Refill: Capillary refill takes less than 2 seconds.  Neurological:     Mental Status: She is alert.  Psychiatric:        Mood and Affect: Mood normal.    ED Results / Procedures / Treatments   Labs (all labs ordered are listed, but only abnormal results are displayed) Labs Reviewed  CBC WITH DIFFERENTIAL/PLATELET - Abnormal; Notable for the following components:      Result Value   WBC 11.5 (*)    Neutro Abs 7.9 (*)    All other components within normal limits  BASIC METABOLIC PANEL - Abnormal; Notable for the following components:   Glucose, Bld 100 (*)    All other components within normal limits  HCG, SERUM, QUALITATIVE    EKG None  Radiology CT Orbits W Contrast  Result Date: 07/07/2021 CLINICAL DATA:  Ocular pain with periorbital cellulitis. Infected pustule near the glabella EXAM: CT ORBITS WITH CONTRAST TECHNIQUE: Multidetector CT images was performed according to the standard protocol following intravenous contrast administration. RADIATION DOSE REDUCTION: This exam was performed according to the departmental dose-optimization program which includes automated exposure control, adjustment of the mA and/or kV according to patient size and/or use of iterative reconstruction technique. CONTRAST:  145mL OMNIPAQUE IOHEXOL 300 MG/ML  SOLN COMPARISON:  No pertinent prior exam. FINDINGS: Orbits: No evidence of postseptal and inflammation. Normal appearance of the globes, optic nerve sheath complexes, extraocular muscles, lacrimal glands, and superior ophthalmic veins. Visible paranasal sinuses: Presumed retention cyst in the floor the right maxillary sinus. Soft tissues: Cellulitic changes surround the reported pustule on the glabella. No soft tissue gas or drainable collection. Osseous: No bony erosion. Limited intracranial: Negative. Normal appearance of the cavernous sinus region and brain. IMPRESSION: Cellulitis centered on the glabellar  pustule. No drainable collection or postseptal inflammation. Electronically Signed   By: Jorje Guild M.D.   On: 07/07/2021 10:57    Procedures Procedures    Medications Ordered in ED Medications  iohexol (OMNIPAQUE) 300 MG/ML solution 100 mL (100 mLs Intravenous Contrast Given 07/07/21 1006)  doxycycline (VIBRA-TABS) tablet 100 mg (100 mg Oral Given 07/07/21 1149)    ED Course/ Medical Decision Making/ A&P                           Medical Decision Making Amount and/or Complexity of Data Reviewed External Data Reviewed: labs, radiology and notes. Labs: ordered. Radiology: ordered.  Risk Prescription drug management.   26 y.o. female with no significant PMH presenting for forehead pain  and swelling after infected acne pustule. On exam pt is Aox3, no acute distress, afebrile, with stable vitals. Physical exam demonstrates no deficits in  EOM. Pain with EOM present. Reported visual changes. Gross vision intact on exam. CT orbit with contrast ordered to rule out orbital cellulitis.   No orbital cellulitis on exam. Periorbital cellulitis on physical exam. No identifiable fluid collection on CT. Doxycyline given in ED with strict return precautions.   Patient in no distress and overall condition improved here in the ED. Detailed discussions were had with the patient regarding current findings, and need for close f/u with PCP or on call doctor. The patient has been instructed to return immediately if the symptoms worsen in any way for re-evaluation. Patient verbalized understanding and is in agreement with current care plan. All questions answered prior to discharge.         Final Clinical Impression(s) / ED Diagnoses Final diagnoses:  Periorbital edema of left eye  Pustule    Rx / DC Orders ED Discharge Orders          Ordered    doxycycline (VIBRAMYCIN) 100 MG capsule  2 times daily        07/07/21 1132              Lianne Cure, DO [id number]    Campbell Stall P, DO [id number] [phone]

## 2021-08-27 ENCOUNTER — Other Ambulatory Visit: Payer: Self-pay

## 2021-08-27 ENCOUNTER — Encounter (HOSPITAL_BASED_OUTPATIENT_CLINIC_OR_DEPARTMENT_OTHER): Payer: Self-pay | Admitting: Emergency Medicine

## 2021-08-27 ENCOUNTER — Emergency Department (HOSPITAL_BASED_OUTPATIENT_CLINIC_OR_DEPARTMENT_OTHER)
Admission: EM | Admit: 2021-08-27 | Discharge: 2021-08-27 | Disposition: A | Discharge status: Home / Self Care | Payer: Medicaid Other | Attending: Emergency Medicine | Admitting: Emergency Medicine

## 2021-08-27 ENCOUNTER — Emergency Department (HOSPITAL_BASED_OUTPATIENT_CLINIC_OR_DEPARTMENT_OTHER): Payer: Medicaid Other

## 2021-08-27 DIAGNOSIS — S99911A Unspecified injury of right ankle, initial encounter: Secondary | ICD-10-CM | POA: Diagnosis present

## 2021-08-27 DIAGNOSIS — X501XXA Overexertion from prolonged static or awkward postures, initial encounter: Secondary | ICD-10-CM | POA: Diagnosis not present

## 2021-08-27 DIAGNOSIS — M25571 Pain in right ankle and joints of right foot: Secondary | ICD-10-CM | POA: Diagnosis not present

## 2021-08-27 DIAGNOSIS — S93401A Sprain of unspecified ligament of right ankle, initial encounter: Secondary | ICD-10-CM

## 2021-08-27 MED ORDER — IBUPROFEN 600 MG PO TABS: 600.0000 mg | ORAL_TABLET | Freq: Four times a day (QID) | ORAL | 0 refills | Status: DC | PRN

## 2021-08-27 MED ORDER — IBUPROFEN 800 MG PO TABS
800.0000 mg | ORAL_TABLET | Freq: Once | ORAL | Status: AC
  Administered 2021-08-27: 800 mg via ORAL
  Filled 2021-08-27: qty 1

## 2021-08-27 MED ORDER — ACETAMINOPHEN 325 MG PO TABS: 650.0000 mg | ORAL_TABLET | Freq: Four times a day (QID) | ORAL | 0 refills | Status: DC | PRN

## 2021-08-27 MED ORDER — ACETAMINOPHEN 325 MG PO TABS
650.0000 mg | ORAL_TABLET | Freq: Once | ORAL | Status: AC
  Administered 2021-08-27: 650 mg via ORAL
  Filled 2021-08-27: qty 2

## 2021-08-27 NOTE — Discharge Instructions (Addendum)
It was a pleasure caring for you today in the emergency department. ° °Please return to the emergency department for any worsening or worrisome symptoms. ° ° °

## 2021-08-27 NOTE — ED Provider Notes (Signed)
?[facility] EMERGENCY DEPT ?Provider Note ? ? ?CSN: [id number] ?Arrival date & time: 08/27/21  [id number] ? ?  ? ?History ? ?Chief Complaint  ?Patient presents with  ? Ankle Pain  ? ? ?Cassandra Hill is a 26 y.o. female. ? ? Patient as above with significant medical history as below, including IBS, migraines who presents to the ED with complaint of right ankle injury ? ?Location: Right ankle, more so lateral malleolus ?Duration: 24 hours ?Onset: Yesterday, sudden ?Timing: Constant ?Description: Lambert ModySharp, stabbing ?Severity: Mild ?Exacerbating/Alleviating Factors: Worse with weightbearing, range of motion of the right ankle ?Associated Symptoms: Ankle swelling ?Pertinent Negatives: No fevers, chills, back pain, knee pain, hip pain.  No LOC.  No IV drug use.  No rashes. ?Context: Patient [facility], rolled her ankle on some rocks by her vehicle.  Felt sharp pain to the right ankle.  Limited weightbearing on the right ankle since the injury.  No medications prior to arrival. ? ? ? ?Past Medical History: ?No date: Arthritis ?No date: Back pain ?No date: IBS (irritable bowel syndrome) ?No date: Migraine ? ?Past Surgical History: ?No date: INDUCED ABORTION ?No date: tubes in ears  ? ? ?The history is provided by the patient. No language interpreter was used.  ?Ankle Pain ?Associated symptoms: no back pain and no fever   ? ?  ? ?Home Medications ?Prior to Admission medications   ?Medication Sig Start Date End Date Taking? Authorizing Provider  ?acetaminophen (TYLENOL) 325 MG tablet Take 2 tablets (650 mg total) by mouth every 6 (six) hours as needed. 08/27/21  Yes Tanda RockersGray, Marek Nghiem A, DO  ?ibuprofen (ADVIL) 600 MG tablet Take 1 tablet (600 mg total) by mouth every 6 (six) hours as needed. 08/27/21  Yes Tanda RockersGray, Keia Rask A, DO  ?acetaminophen (TYLENOL) 500 MG tablet Take 1,000 mg by mouth every 6 (six) hours as needed. With Rizatriptan    [provider]  ?azithromycin (ZITHROMAX)  250 MG tablet Take 1 tablet (250 mg total) by mouth daily. Take first 2 tablets together, then 1 every day until finished. 04/09/21   Curatolo, Adam, DO  ?buPROPion (WELLBUTRIN XL) 150 MG 24 hr tablet Take 150 mg by mouth daily.    [provider]  ?doxycycline (VIBRAMYCIN) 100 MG capsule Take 1 capsule (100 mg total) by mouth 2 (two) times daily. 07/07/21   Franne FortsGray, Alicia P, DO  ?erythromycin ophthalmic ointment Place a 1/2 inch ribbon of ointment into the left lower eyelid BID x 5 days 06/20/21   Fayrene Helperran, Bowie, PA-C  ?guaiFENesin-codeine 100-10 MG/5ML syrup Take 5 mLs by mouth 3 (three) times daily as needed for cough. 11/10/20   Jacalyn LefevreHaviland, Julie, MD  ?hydrOXYzine (ATARAX/VISTARIL) 10 MG tablet Take 10 mg by mouth 3 (three) times daily as needed.    [provider]  ?methylphenidate 18 MG PO CR tablet Take 18 mg by mouth daily.    [provider]  ?ondansetron (ZOFRAN ODT) 4 MG disintegrating tablet Take 1 tablet (4 mg total) by mouth every 8 (eight) hours as needed for nausea or vomiting. 01/09/21   Cardama, Amadeo GarnetPedro Eduardo, MD  ?rizatriptan (MAXALT) 10 MG tablet Take 10 mg by mouth as needed for migraine. May repeat in 2 hours if needed    [provider]  ?   ? ?Allergies    ?Patient has no known allergies.   ? ?Review of Systems   ?Review of Systems  ?Constitutional:  Negative for activity change and fever.  ?  HENT:  Negative for facial swelling and trouble swallowing.   ?Eyes:  Negative for discharge and redness.  ?Respiratory:  Negative for cough and shortness of breath.   ?Cardiovascular:  Negative for chest pain and palpitations.  ?Gastrointestinal:  Negative for abdominal pain and nausea.  ?Genitourinary:  Negative for dysuria and flank pain.  ?Musculoskeletal:  Positive for arthralgias, gait problem and joint swelling. Negative for back pain.  ?Skin:  Negative for pallor and rash.  ?Neurological:  Negative for syncope and headaches.  ? ?Physical Exam ?Updated Vital Signs ?BP 115/72  (BP Location: Right Arm)   Pulse 73   Temp 98.2 ?F (36.8 ?C) (Oral)   Resp 16   Ht 5\' 2"  (1.575 m)   Wt 72.6 kg   SpO2 99%   BMI 29.26 kg/m?  ?Physical Exam ?Vitals and nursing note reviewed.  ?Constitutional:   ?   General: She is not in acute distress. ?   Appearance: Normal appearance.  ?HENT:  ?   Head: Normocephalic and atraumatic.  ?   Right Ear: External ear normal.  ?   Left Ear: External ear normal.  ?   Nose: Nose normal.  ?   Mouth/Throat:  ?   Mouth: Mucous membranes are moist.  ?Eyes:  ?   General: No scleral icterus.    ?   Right eye: No discharge.     ?   Left eye: No discharge.  ?Cardiovascular:  ?   Rate and Rhythm: Normal rate and regular rhythm.  ?   Pulses: Normal pulses.  ?   Heart sounds: Normal heart sounds.  ?Pulmonary:  ?   Effort: Pulmonary effort is normal. No respiratory distress.  ?   Breath sounds: Normal breath sounds.  ?Abdominal:  ?   General: Abdomen is flat. There is no distension.  ?   Tenderness: There is no abdominal tenderness.  ?Musculoskeletal:     ?   General: Normal range of motion.  ?   Cervical back: Normal range of motion.  ?   Right lower leg: No edema.  ?   Left lower leg: No edema.  ?     Feet: ? ?Feet:  ?   Comments: No pain to ipsilateral knee.  No pain to fibular head.  Achilles tendon is palpable.  No pain with squeeze test of tib-fib.  DP pulses are intact.  Capillary refill is brisk.  Extremities warm, well-perfused.  There is mild swelling to the right ankle more so at the posterior portion of the lateral malleolus.  No erythema.  Skin is intact over the joint ?Skin: ?   General: Skin is warm and dry.  ?   Capillary Refill: Capillary refill takes less than 2 seconds.  ?Neurological:  ?   Mental Status: She is alert.  ?Psychiatric:     ?   Mood and Affect: Mood normal.     ?   Behavior: Behavior normal.  ? ? ?ED Results / Procedures / Treatments   ?Labs ?(all labs ordered are listed, but only abnormal results are displayed) ?Labs Reviewed - No data to  display ? ?EKG ?None ? ?Radiology ?DG Ankle Complete Right ? ?Result Date: 08/27/2021 ?CLINICAL DATA:  Pt arrives to ED with c/o right ankle pain. She reports twisting the ankle yesterday. Fall EXAM: RIGHT ANKLE - COMPLETE 3+ VIEW COMPARISON:  None. FINDINGS: Soft tissue swelling over the lateral malleolus. Ankle mortise intact. The talar dome is normal. No malleolar fracture. The calcaneus is normal.  IMPRESSION: No fracture or dislocation.  Soft tissue swelling Electronically Signed   By: Genevive Bi M.D.   On: 08/27/2021 08:16   ? ?Procedures ?Procedures  ? ? ?Medications Ordered in ED ?Medications  ?ibuprofen (ADVIL) tablet 800 mg (800 mg Oral Given 08/27/21 0817)  ?acetaminophen (TYLENOL) tablet 650 mg (650 mg Oral Given 08/27/21 0817)  ? ? ?ED Course/ Medical Decision Making/ A&P ?  ?                        ?Medical Decision Making ?Amount and/or Complexity of Data Reviewed ?Radiology: ordered. ? ?Risk ?OTC drugs. ?Prescription drug management. ? ? ?This patient presents to the ED for concern of ankle pain right, this involves an extensive number of treatment options, and is a complaint that carries with it a high risk of complications and morbidity.  The differential diagnosis includes sprain, strain, fracture ? ?MDM:   ? ?Well-appearing 26 year old female to the ED with right ankle injury.  No medications prior to arrival.  Has not been using assist device since the injury.  Limited weightbearing. ? ?Ankle itself is mildly swollen, I have very low suspicion for septic joint.  No fever.  Reduced range of motion of right ankle secondary to discomfort.  Neurovascularly intact. ? ?Apply ice, elevate.  Give Tylenol/Motrin.  Obtain ankle x-ray. ? ? ?X-ray without fracture, suspicious for sprain.  Given difficulty with weightbearing we will give air cast ankle brace and crutches.  Rehab instructions. Outpatient follow-up. ? ? ?(Labs, imaging) ? ?Labs: ?I Ordered, and personally interpreted labs.  The pertinent  results include: N/A ? ?Imaging Studies ordered: ?I ordered imaging studies including right ankle x-ray ?I independently visualized and interpreted imaging. ?I agree with the radiologist interpretation.  No fracture

## 2021-08-27 NOTE — ED Triage Notes (Signed)
Pt arrives to ED with c/o right ankle pain. She reports twisting the ankle yesterday.  ?

## 2021-09-05 ENCOUNTER — Emergency Department (HOSPITAL_BASED_OUTPATIENT_CLINIC_OR_DEPARTMENT_OTHER)
Admission: EM | Admit: 2021-09-05 | Discharge: 2021-09-05 | Disposition: A | Discharge status: Home / Self Care | Payer: Medicaid Other | Attending: Emergency Medicine | Admitting: Emergency Medicine

## 2021-09-05 ENCOUNTER — Other Ambulatory Visit: Payer: Self-pay

## 2021-09-05 ENCOUNTER — Encounter (HOSPITAL_BASED_OUTPATIENT_CLINIC_OR_DEPARTMENT_OTHER): Payer: Self-pay | Admitting: Obstetrics and Gynecology

## 2021-09-05 ENCOUNTER — Other Ambulatory Visit (HOSPITAL_BASED_OUTPATIENT_CLINIC_OR_DEPARTMENT_OTHER): Payer: Self-pay

## 2021-09-05 DIAGNOSIS — Z20822 Contact with and (suspected) exposure to covid-19: Secondary | ICD-10-CM | POA: Diagnosis not present

## 2021-09-05 DIAGNOSIS — N390 Urinary tract infection, site not specified: Secondary | ICD-10-CM | POA: Insufficient documentation

## 2021-09-05 DIAGNOSIS — J029 Acute pharyngitis, unspecified: Secondary | ICD-10-CM | POA: Diagnosis present

## 2021-09-05 DIAGNOSIS — J020 Streptococcal pharyngitis: Secondary | ICD-10-CM | POA: Insufficient documentation

## 2021-09-05 DIAGNOSIS — M542 Cervicalgia: Secondary | ICD-10-CM | POA: Insufficient documentation

## 2021-09-05 LAB — URINALYSIS, ROUTINE W REFLEX MICROSCOPIC
Bilirubin Urine: NEGATIVE
Cellular Cast, UA: 3
Glucose, UA: NEGATIVE mg/dL
Nitrite: NEGATIVE
Protein, ur: 30 mg/dL — AB
Specific Gravity, Urine: 1.026 (ref 1.005–1.030)
pH: 6 (ref 5.0–8.0)

## 2021-09-05 LAB — GROUP A STREP BY PCR: Group A Strep by PCR: DETECTED — AB

## 2021-09-05 LAB — RESP PANEL BY RT-PCR (FLU A&B, COVID) ARPGX2
Influenza A by PCR: NEGATIVE
Influenza B by PCR: NEGATIVE
SARS Coronavirus 2 by RT PCR: NEGATIVE

## 2021-09-05 LAB — PREGNANCY, URINE: Preg Test, Ur: NEGATIVE

## 2021-09-05 MED ORDER — AMOXICILLIN-POT CLAVULANATE 875-125 MG PO TABS
1.0000 | ORAL_TABLET | Freq: Two times a day (BID) | ORAL | 0 refills | Status: DC
  Filled 2021-09-05: qty 14, 7d supply, fill #0

## 2021-09-05 MED ORDER — ONDANSETRON 4 MG PO TBDP
4.0000 mg | ORAL_TABLET | Freq: Once | ORAL | Status: AC
  Administered 2021-09-05: 4 mg via ORAL
  Filled 2021-09-05: qty 1

## 2021-09-05 MED ORDER — KETOROLAC TROMETHAMINE 30 MG/ML IJ SOLN
30.0000 mg | Freq: Once | INTRAMUSCULAR | Status: AC
  Administered 2021-09-05: 30 mg via INTRAMUSCULAR
  Filled 2021-09-05: qty 1

## 2021-09-05 NOTE — ED Provider Notes (Signed)
?[facility] ?Provider Note ? ? ?CSN: ID:[id number] ?Arrival date & time: 09/05/21  [id number] ? ?  ? ?History ? ?Chief Complaint  ?Patient presents with  ? Sore Throat  ? Abdominal Pain  ? Headache  ? Neck Pain  ?   ?  ? ? ?Cassandra Hill is a 26 y.o. female.  Presents to the emergency department due to multiple different concerns.  Patient states that she has been having about 3 days of feeling generally unwell.  Has had sore throat, congestion.  Slight cough.  No difficulty breathing.  Low-grade fever at home.  Chills.  Tmax 100.  Still able to eat and drink.  Some nasal congestion.  States that she normally has issues with her allergies in the springtime.  She denies neck stiffness.  Headache is frontal, moderate, not sudden onset, not worst headache of her life. ? ?HPI ? ?  ? ?Home Medications ?Prior to Admission medications   ?Medication Sig Start Date End Date Taking? Authorizing Provider  ?amoxicillin-clavulanate (AUGMENTIN) 875-125 MG tablet Take 1 tablet by mouth every 12 (twelve) hours. 09/05/21  Yes Lucrezia Starch, MD  ?acetaminophen (TYLENOL) 325 MG tablet Take 2 tablets (650 mg total) by mouth every 6 (six) hours as needed. 08/27/21   Jeanell Sparrow, DO  ?acetaminophen (TYLENOL) 500 MG tablet Take 1,000 mg by mouth every 6 (six) hours as needed. With Rizatriptan    [provider]  ?azithromycin (ZITHROMAX) 250 MG tablet Take 1 tablet (250 mg total) by mouth daily. Take first 2 tablets together, then 1 every day until finished. 04/09/21   Curatolo, Adam, DO  ?buPROPion (WELLBUTRIN XL) 150 MG 24 hr tablet Take 150 mg by mouth daily.    [provider]  ?doxycycline (VIBRAMYCIN) 100 MG capsule Take 1 capsule (100 mg total) by mouth 2 (two) times daily. [id number]   Lianne Cure, DO  ?erythromycin ophthalmic ointment Place a 1/2 inch ribbon of ointment into the left lower eyelid BID x 5 days 06/20/21   Domenic Moras, PA-C  ?guaiFENesin-codeine 100-10 MG/5ML syrup Take  5 mLs by mouth 3 (three) times daily as needed for cough. 11/10/20   Isla Pence, MD  ?hydrOXYzine (ATARAX/VISTARIL) 10 MG tablet Take 10 mg by mouth 3 (three) times daily as needed.    [provider]  ?ibuprofen (ADVIL) 600 MG tablet Take 1 tablet (600 mg total) by mouth every 6 (six) hours as needed. 08/27/21   Jeanell Sparrow, DO  ?methylphenidate 18 MG PO CR tablet Take 18 mg by mouth daily.    [provider]  ?ondansetron (ZOFRAN ODT) 4 MG disintegrating tablet Take 1 tablet (4 mg total) by mouth every 8 (eight) hours as needed for nausea or vomiting. 01/09/21   Cardama, Grayce Sessions, MD  ?rizatriptan (MAXALT) 10 MG tablet Take 10 mg by mouth as needed for migraine. May repeat in 2 hours if needed    [provider]  ?   ? ?Allergies    ?Patient has no known allergies.   ? ?Review of Systems   ?Review of Systems  ?Constitutional:  Positive for chills and fatigue. Negative for fever.  ?HENT:  Positive for sore throat. Negative for ear pain.   ?Eyes:  Negative for pain and visual disturbance.  ?Respiratory:  Negative for cough and shortness of breath.   ?Cardiovascular:  Negative for chest pain and palpitations.  ?Gastrointestinal:  Negative for abdominal pain and vomiting.  ?Genitourinary:  Negative for dysuria  and hematuria.  ?Musculoskeletal:  Positive for arthralgias. Negative for back pain.  ?Skin:  Negative for color change and rash.  ?Neurological:  Positive for headaches. Negative for seizures and syncope.  ?All other systems reviewed and are negative. ? ?Physical Exam ?Updated Vital Signs ?BP 124/80 (BP Location: Right Arm)   Pulse 89   Temp 98.9 ?F (37.2 ?C) (Oral)   Resp 16   Ht 5\' 2"  (1.575 m)   Wt 73 kg   SpO2 99%   BMI 29.44 kg/m?  ?Physical Exam ?Vitals and nursing note reviewed.  ?Constitutional:   ?   General: She is not in acute distress. ?   Appearance: She is well-developed.  ?HENT:  ?   Head: Normocephalic and atraumatic.  ?   Comments: Normal neck range of  motion, no tenderness to palpation throughout entirety of neck, neck is supple ?   Mouth/Throat:  ?   Comments: Erythematous posterior oropharynx, no exudates noted ?Eyes:  ?   Conjunctiva/sclera: Conjunctivae normal.  ?Cardiovascular:  ?   Rate and Rhythm: Normal rate and regular rhythm.  ?   Heart sounds: No murmur heard. ?Pulmonary:  ?   Effort: Pulmonary effort is normal. No respiratory distress.  ?   Breath sounds: Normal breath sounds.  ?Abdominal:  ?   Palpations: Abdomen is soft.  ?   Tenderness: There is no abdominal tenderness.  ?Musculoskeletal:     ?   General: No swelling.  ?   Cervical back: Neck supple.  ?Skin: ?   General: Skin is warm and dry.  ?   Capillary Refill: Capillary refill takes less than 2 seconds.  ?Neurological:  ?   Mental Status: She is alert.  ?Psychiatric:     ?   Mood and Affect: Mood normal.  ? ? ?ED Results / Procedures / Treatments   ?Labs ?(all labs ordered are listed, but only abnormal results are displayed) ?Labs Reviewed  ?GROUP A STREP BY PCR - Abnormal; Notable for the following components:  ?    Result Value  ? Group A Strep by PCR DETECTED (*)   ? All other components within normal limits  ?URINALYSIS, ROUTINE W REFLEX MICROSCOPIC - Abnormal; Notable for the following components:  ? APPearance HAZY (*)   ? Hgb urine dipstick LARGE (*)   ? Ketones, ur TRACE (*)   ? Protein, ur 30 (*)   ? Leukocytes,Ua MODERATE (*)   ? Bacteria, UA MANY (*)   ? All other components within normal limits  ?RESP PANEL BY RT-PCR (FLU A&B, COVID) [id number]  ?PREGNANCY, URINE  ? ? ?EKG ?None ? ?Radiology ?No results found. ? ?Procedures ?Procedures  ? ? ?Medications Ordered in ED ?Medications  ?ketorolac (TORADOL) 30 MG/ML injection 30 mg (30 mg Intramuscular Given 09/05/21 0824)  ?ondansetron (ZOFRAN-ODT) disintegrating tablet 4 mg (4 mg Oral Given 09/05/21 0823)  ? ? ?ED Course/ Medical Decision Making/ A&P ?  ?                        ?Medical Decision Making ?Amount and/or Complexity of Data  Reviewed ?Labs: ordered. ? ?Risk ?Prescription drug management. ? ? ?26 year old lady presenting to ER due to concern for headache, sore throat, nasal congestion.  On physical exam she is remarkably well-appearing in no distress with normal vital signs.  Check flu, COVID, strep, UA.  Provided some symptomatic control.  On reassessment, symptoms markedly improved. ? ?Urine with possible UTI.  Strep test  is positive.  Suspect strep pharyngitis most likely causative for her symptoms today.  Will prescribe Augmentin which will cover both the strep throat as well as should cover the possible UTI. ? ?Advise follow-up with primary care doctor, reviewed return precautions and discharged. ? ? ? ?After the discussed management above, the patient was determined to be safe for discharge.  The patient was in agreement with this plan and all questions regarding their care were answered.  ED return precautions were discussed and the patient will return to the ED with any significant worsening of condition. ? ? ? ? ? ? ? ? ?Final Clinical Impression(s) / ED Diagnoses ?Final diagnoses:  ?Strep pharyngitis  ?Urinary tract infection without hematuria, site unspecified  ? ? ?Rx / DC Orders ?ED Discharge Orders   ? ?      Ordered  ?  amoxicillin-clavulanate (AUGMENTIN) 875-125 MG tablet  Every 12 hours       ? 09/05/21 1039  ? ?  ?  ? ?  ? ? ?  ?Lucrezia Starch, MD ?09/05/21 1040 ? ?

## 2021-09-05 NOTE — Discharge Instructions (Signed)
Take antibiotic as prescribed.  Follow-up with your primary care doctor for recheck later this week.  Come back to ER if you develop worsening fever, vomiting, neck stiffness, or other new concerning symptoms. ?

## 2021-09-05 NOTE — ED Triage Notes (Signed)
Patient reports to the ER for neck pain reporting she cannot turn her head all the way to the right but can turn it to the left. Endorses x3 days of headache. Nausea and stomach pain. Denies diarrhea or emesis. Also endorses sore throat.  ?

## 2021-10-12 ENCOUNTER — Other Ambulatory Visit (HOSPITAL_BASED_OUTPATIENT_CLINIC_OR_DEPARTMENT_OTHER): Payer: Self-pay

## 2021-10-12 ENCOUNTER — Other Ambulatory Visit: Payer: Self-pay

## 2021-10-12 ENCOUNTER — Emergency Department (HOSPITAL_BASED_OUTPATIENT_CLINIC_OR_DEPARTMENT_OTHER)
Admission: EM | Admit: 2021-10-12 | Discharge: 2021-10-12 | Disposition: A | Discharge status: Home / Self Care | Payer: Medicaid Other | Attending: Emergency Medicine | Admitting: Emergency Medicine

## 2021-10-12 ENCOUNTER — Emergency Department (HOSPITAL_BASED_OUTPATIENT_CLINIC_OR_DEPARTMENT_OTHER): Payer: Medicaid Other

## 2021-10-12 ENCOUNTER — Encounter (HOSPITAL_BASED_OUTPATIENT_CLINIC_OR_DEPARTMENT_OTHER): Payer: Self-pay

## 2021-10-12 DIAGNOSIS — Z20822 Contact with and (suspected) exposure to covid-19: Secondary | ICD-10-CM | POA: Insufficient documentation

## 2021-10-12 DIAGNOSIS — J069 Acute upper respiratory infection, unspecified: Secondary | ICD-10-CM

## 2021-10-12 DIAGNOSIS — R509 Fever, unspecified: Secondary | ICD-10-CM | POA: Diagnosis present

## 2021-10-12 DIAGNOSIS — R112 Nausea with vomiting, unspecified: Secondary | ICD-10-CM | POA: Diagnosis not present

## 2021-10-12 LAB — COMPREHENSIVE METABOLIC PANEL
ALT: 10 U/L (ref 0–44)
AST: 9 U/L — ABNORMAL LOW (ref 15–41)
Albumin: 4.1 g/dL (ref 3.5–5.0)
Alkaline Phosphatase: 69 U/L (ref 38–126)
Anion gap: 9 (ref 5–15)
BUN: 9 mg/dL (ref 6–20)
CO2: 27 mmol/L (ref 22–32)
Calcium: 9.5 mg/dL (ref 8.9–10.3)
Chloride: 103 mmol/L (ref 98–111)
Creatinine, Ser: 0.63 mg/dL (ref 0.44–1.00)
GFR, Estimated: >60 mL/min (ref >60)
Glucose, Bld: 93 mg/dL (ref 70–99)
Potassium: 3.8 mmol/L (ref 3.5–5.1)
Sodium: 139 mmol/L (ref 135–145)
Total Bilirubin: 0.9 mg/dL (ref 0.3–1.2)
Total Protein: 7.1 g/dL (ref 6.5–8.1)

## 2021-10-12 LAB — CBC WITH DIFFERENTIAL/PLATELET
Abs Immature Granulocytes: 0.04 10*3/uL (ref 0.00–0.07)
Basophils Absolute: 0.1 10*3/uL (ref 0.0–0.1)
Basophils Relative: 1 %
Eosinophils Absolute: 0.6 10*3/uL — ABNORMAL HIGH (ref 0.0–0.5)
Eosinophils Relative: 5 %
HCT: 36.3 % (ref 36.0–46.0)
Hemoglobin: 11.9 g/dL — ABNORMAL LOW (ref 12.0–15.0)
Immature Granulocytes: 0 %
Lymphocytes Relative: 9 %
Lymphs Abs: 1.2 10*3/uL (ref 0.7–4.0)
MCH: 26.9 pg (ref 26.0–34.0)
MCHC: 32.8 g/dL (ref 30.0–36.0)
MCV: 82.1 fL (ref 80.0–100.0)
Monocytes Absolute: 1.4 10*3/uL — ABNORMAL HIGH (ref 0.1–1.0)
Monocytes Relative: 10 %
Neutro Abs: 10.3 10*3/uL — ABNORMAL HIGH (ref 1.7–7.7)
Neutrophils Relative %: 75 %
Platelets: 332 10*3/uL (ref 150–400)
RBC: 4.42 MIL/uL (ref 3.87–5.11)
RDW: 12.5 % (ref 11.5–15.5)
WBC: 13.7 10*3/uL — ABNORMAL HIGH (ref 4.0–10.5)
nRBC: 0 % (ref 0.0–0.2)

## 2021-10-12 LAB — URINALYSIS, ROUTINE W REFLEX MICROSCOPIC
Bilirubin Urine: NEGATIVE
Glucose, UA: NEGATIVE mg/dL
Ketones, ur: NEGATIVE mg/dL
Nitrite: NEGATIVE
Specific Gravity, Urine: 1.028 (ref 1.005–1.030)
pH: 5.5 (ref 5.0–8.0)

## 2021-10-12 LAB — RESP PANEL BY RT-PCR (FLU A&B, COVID) ARPGX2
Influenza A by PCR: NEGATIVE
Influenza B by PCR: NEGATIVE
SARS Coronavirus 2 by RT PCR: NEGATIVE

## 2021-10-12 LAB — LIPASE, BLOOD: Lipase: <10 U/L — ABNORMAL LOW (ref 11–51)

## 2021-10-12 LAB — PREGNANCY, URINE: Preg Test, Ur: NEGATIVE

## 2021-10-12 MED ORDER — SUCRALFATE 1 G PO TABS
1.0000 g | ORAL_TABLET | Freq: Three times a day (TID) | ORAL | 0 refills | Status: DC
  Filled 2021-10-12: qty 28, 7d supply, fill #0

## 2021-10-12 MED ORDER — KETOROLAC TROMETHAMINE 15 MG/ML IJ SOLN
15.0000 mg | Freq: Once | INTRAMUSCULAR | Status: AC
  Administered 2021-10-12: 15 mg via INTRAVENOUS
  Filled 2021-10-12: qty 1

## 2021-10-12 MED ORDER — FAMOTIDINE IN NACL 20-0.9 MG/50ML-% IV SOLN
20.0000 mg | Freq: Once | INTRAVENOUS | Status: AC
  Administered 2021-10-12: 20 mg via INTRAVENOUS
  Filled 2021-10-12: qty 50

## 2021-10-12 MED ORDER — LACTATED RINGERS IV BOLUS
2000.0000 mL | Freq: Once | INTRAVENOUS | Status: AC
  Administered 2021-10-12: 1000 mL via INTRAVENOUS

## 2021-10-12 MED ORDER — ONDANSETRON HCL 4 MG PO TABS
4.0000 mg | ORAL_TABLET | ORAL | 0 refills | Status: DC | PRN
  Filled 2021-10-12: qty 8, 2d supply, fill #0

## 2021-10-12 MED ORDER — FLUTICASONE PROPIONATE 50 MCG/ACT NA SUSP
1.0000 | Freq: Every day | NASAL | 0 refills | Status: DC
  Filled 2021-10-12: qty 16, 30d supply, fill #0

## 2021-10-12 MED ORDER — ONDANSETRON HCL 4 MG/2ML IJ SOLN
4.0000 mg | Freq: Once | INTRAMUSCULAR | Status: AC
  Administered 2021-10-12: 4 mg via INTRAVENOUS
  Filled 2021-10-12: qty 2

## 2021-10-12 NOTE — ED Triage Notes (Addendum)
Onset Wednesday of fever.  Cough and congestion.  Nasal congestion.  States wheezing this am.  Clear at present.  NAD  Vomiting off and on but denies abdominal pain

## 2021-10-12 NOTE — Discharge Instructions (Signed)
The patient improved significantly and was discharged in stable condition. Detailed discussions were had with the patient regarding current findings, and need for close f/u with PCP or on call doctor. The patient has been instructed to return immediately if the symptoms worsen in any way for re-evaluation. Patient verbalized understanding and is in agreement with current care plan. All questions answered prior to discharge.

## 2021-10-12 NOTE — ED Notes (Signed)
Given po challenge

## 2021-10-12 NOTE — ED Provider Notes (Signed)
[facility] EMERGENCY DEPT Provider Note   CSN: [id number] Arrival date & time: 10/12/21  [id number]     History {Add pertinent medical, surgical, social history, OB history to HPI:1} No chief complaint on file.   Cassandra Hill is a 26 y.o. female.  Patient as above with significant medical history as below, including arthritis, IBS, migraine who presents to the ED with complaint of URI symptoms, low-grade fever, body aches, nonproductive cough, rhinorrhea, nausea and vomiting.  Positive sick contacts with spouse with similar complaints.  Also children at home with similar complaint.  Patient reports onset of symptoms 2 days ago.  Difficult tolerating p.o. intake during that time because when she eats something it makes her feel sick and she is intermittently vomiting.  No diarrhea.  No bright blood per rectum or melena.  No change urination.  No rashes.  No trauma.  No chest pain.  No dyspnea.     Past Medical History:  Diagnosis Date   Arthritis    Back pain    IBS (irritable bowel syndrome)    Migraine     Past Surgical History:  Procedure Laterality Date   INDUCED ABORTION     tubes in ears       The history is provided by the patient. No language interpreter was used.      Home Medications Prior to Admission medications   Medication Sig Start Date End Date Taking? Authorizing Provider  acetaminophen (TYLENOL) 325 MG tablet Take 2 tablets (650 mg total) by mouth every 6 (six) hours as needed. 08/27/21   Jeanell Sparrow, DO  acetaminophen (TYLENOL) 500 MG tablet Take 1,000 mg by mouth every 6 (six) hours as needed. With Rizatriptan    [provider]  amoxicillin-clavulanate (AUGMENTIN) 875-125 MG tablet Take 1 tablet by mouth every 12 (twelve) hours. 09/05/21   Lucrezia Starch, MD  azithromycin (ZITHROMAX) 250 MG tablet Take 1 tablet (250 mg total) by mouth daily. Take first 2 tablets together, then 1 every day until finished. 04/09/21   Curatolo,  Adam, DO  buPROPion (WELLBUTRIN XL) 150 MG 24 hr tablet Take 150 mg by mouth daily.    [provider]  doxycycline (VIBRAMYCIN) 100 MG capsule Take 1 capsule (100 mg total) by mouth 2 (two) times daily. [id number]   Campbell Stall P, DO  erythromycin ophthalmic ointment Place a 1/2 inch ribbon of ointment into the left lower eyelid BID x 5 days 06/20/21   Domenic Moras, PA-C  guaiFENesin-codeine 100-10 MG/5ML syrup Take 5 mLs by mouth 3 (three) times daily as needed for cough. 11/10/20   Isla Pence, MD  hydrOXYzine (ATARAX/VISTARIL) 10 MG tablet Take 10 mg by mouth 3 (three) times daily as needed.    [provider]  ibuprofen (ADVIL) 600 MG tablet Take 1 tablet (600 mg total) by mouth every 6 (six) hours as needed. 08/27/21   Jeanell Sparrow, DO  methylphenidate 18 MG PO CR tablet Take 18 mg by mouth daily.    [provider]  ondansetron (ZOFRAN ODT) 4 MG disintegrating tablet Take 1 tablet (4 mg total) by mouth every 8 (eight) hours as needed for nausea or vomiting. 01/09/21   Cardama, Grayce Sessions, MD  rizatriptan (MAXALT) 10 MG tablet Take 10 mg by mouth as needed for migraine. May repeat in 2 hours if needed    [provider]      Allergies    Patient has no known allergies.    Review  of Systems   Review of Systems  Constitutional:  Positive for fatigue and fever. Negative for chills.  HENT:  Positive for congestion and rhinorrhea. Negative for facial swelling and trouble swallowing.   Eyes:  Negative for photophobia and visual disturbance.  Respiratory:  Negative for cough and shortness of breath.   Cardiovascular:  Negative for chest pain and palpitations.  Gastrointestinal:  Positive for abdominal pain, nausea and vomiting.  Endocrine: Negative for polydipsia and polyuria.  Genitourinary:  Negative for difficulty urinating and hematuria.  Musculoskeletal:  Positive for arthralgias. Negative for gait problem and joint swelling.  Skin:  Negative for  pallor and rash.  Neurological:  Negative for syncope and headaches.  Psychiatric/Behavioral:  Negative for agitation and confusion.    Physical Exam Updated Vital Signs There were no vitals taken for this visit. Physical Exam Vitals and nursing note reviewed.  Constitutional:      General: She is not in acute distress.    Appearance: Normal appearance. She is not ill-appearing.  HENT:     Head: Normocephalic and atraumatic.     Right Ear: External ear normal.     Left Ear: External ear normal.     Nose: Nose normal.     Mouth/Throat:     Mouth: Mucous membranes are moist.  Eyes:     General: No scleral icterus.       Right eye: No discharge.        Left eye: No discharge.  Cardiovascular:     Rate and Rhythm: Normal rate and regular rhythm.     Pulses: Normal pulses.     Heart sounds: Normal heart sounds.  Pulmonary:     Effort: Pulmonary effort is normal. No respiratory distress.     Breath sounds: Normal breath sounds.  Abdominal:     General: Abdomen is flat.     Palpations: Abdomen is soft.     Tenderness: There is no abdominal tenderness.  Musculoskeletal:        General: Normal range of motion.     Cervical back: Normal range of motion.     Right lower leg: No edema.     Left lower leg: No edema.  Skin:    General: Skin is warm and dry.     Capillary Refill: Capillary refill takes less than 2 seconds.  Neurological:     Mental Status: She is alert.  Psychiatric:        Mood and Affect: Mood normal.        Behavior: Behavior normal.    ED Results / Procedures / Treatments   Labs (all labs ordered are listed, but only abnormal results are displayed) Labs Reviewed - No data to display  EKG None  Radiology No results found.  Procedures Procedures  {Document cardiac monitor, telemetry assessment procedure when appropriate:1}  Medications Ordered in ED Medications - No data to display  ED Course/ Medical Decision Making/ A&P                            Medical Decision Making Amount and/or Complexity of Data Reviewed Labs: ordered. Radiology: ordered.  Risk Prescription drug management.    CC: Cough, congestion, nausea, vomiting abdominal pain  This patient presents to the Emergency Department for the above complaint. This involves an extensive number of treatment options and is a complaint that carries with it a high risk of complications and morbidity. Vital signs were reviewed. Serious etiologies  considered.  Differential diagnosis includes but is not exclusive to ectopic pregnancy, ovarian cyst, ovarian torsion, acute appendicitis, urinary tract infection, endometriosis, bowel obstruction, hernia, colitis, renal colic, gastroenteritis, volvulus etc.  Differential diagnosis for adult fever includes but is not exclusive to community-acquired pneumonia, urinary tract infection, acute cholecystitis, viral syndrome, cellulitis, tick bourne disease,  decubitus ulcer, necrotizing fasciitis, meningitis, encephalitis, influenza, etc.   Record review:  Previous records obtained and reviewed  Prior ED visits, prior labs and imaging, home medications  Additional history obtained from [facility] and surgical history as noted above.   Work up as above, notable for:  Labs & imaging results that were available during my care of the patient were visualized by me and considered in my medical decision making.   I ordered imaging studies which included chest x-ray. I visualized the imaging, interpreted images, and I agree with radiologist interpretation. ***  Cardiac monitoring reviewed and interpreted personally which shows NSR  Labs reviewed  Management: Given Pepcid, Zofran, IV fluids  Reassessment:  ***  Admission was considered.                Social determinants of health include -  Social History   Socioeconomic History   Marital status: Married    Spouse name: Not on file   Number of children: Not on file    Years of education: Not on file   Highest education level: Not on file  Occupational History   Not on file  Tobacco Use   Smoking status: Never   Smokeless tobacco: Never  Vaping Use   Vaping Use: Former  Substance and Sexual Activity   Alcohol use: Yes    Comment: "Every once a while"    Drug use: Not Currently    Types: Marijuana    Comment: February 2020   Sexual activity: Yes    Birth control/protection: I.U.D.  Other Topics Concern   Not on file  Social History Narrative   Not on file   Social Determinants of Health   Financial Resource Strain: Not on file  Food Insecurity: Not on file  Transportation Needs: Not on file  Physical Activity: Not on file  Stress: Not on file  Social Connections: Not on file  Intimate Partner Violence: Not on file      This chart was dictated using voice recognition software.  Despite best efforts to proofread,  errors can occur which can change the documentation meaning.   {Document critical care time when appropriate:1} {Document review of labs and clinical decision tools ie heart score, Chads2Vasc2 etc:1}  {Document your independent review of radiology images, and any outside records:1} {Document your discussion with family members, caretakers, and with consultants:1} {Document social determinants of health affecting pt's care:1} {Document your decision making why or why not admission, treatments were needed:1} Final Clinical Impression(s) / ED Diagnoses Final diagnoses:  None    Rx / DC Orders ED Discharge Orders     None

## 2021-10-15 ENCOUNTER — Other Ambulatory Visit (HOSPITAL_BASED_OUTPATIENT_CLINIC_OR_DEPARTMENT_OTHER): Payer: Self-pay

## 2021-10-16 ENCOUNTER — Other Ambulatory Visit (HOSPITAL_BASED_OUTPATIENT_CLINIC_OR_DEPARTMENT_OTHER): Payer: Self-pay

## 2021-10-16 ENCOUNTER — Emergency Department (HOSPITAL_BASED_OUTPATIENT_CLINIC_OR_DEPARTMENT_OTHER)
Admission: EM | Admit: 2021-10-16 | Discharge: 2021-10-17 | Disposition: A | Discharge status: Home / Self Care | Payer: Medicaid Other | Attending: Emergency Medicine | Admitting: Emergency Medicine

## 2021-10-16 ENCOUNTER — Encounter (HOSPITAL_BASED_OUTPATIENT_CLINIC_OR_DEPARTMENT_OTHER): Payer: Self-pay

## 2021-10-16 ENCOUNTER — Other Ambulatory Visit: Payer: Self-pay

## 2021-10-16 DIAGNOSIS — R109 Unspecified abdominal pain: Secondary | ICD-10-CM | POA: Insufficient documentation

## 2021-10-16 DIAGNOSIS — R059 Cough, unspecified: Secondary | ICD-10-CM | POA: Diagnosis present

## 2021-10-16 DIAGNOSIS — J189 Pneumonia, unspecified organism: Secondary | ICD-10-CM | POA: Diagnosis not present

## 2021-10-16 LAB — COMPREHENSIVE METABOLIC PANEL
ALT: 11 U/L (ref 0–44)
AST: 8 U/L — ABNORMAL LOW (ref 15–41)
Albumin: 4.1 g/dL (ref 3.5–5.0)
Alkaline Phosphatase: 101 U/L (ref 38–126)
Anion gap: 11 (ref 5–15)
BUN: 7 mg/dL (ref 6–20)
CO2: 25 mmol/L (ref 22–32)
Calcium: 9.7 mg/dL (ref 8.9–10.3)
Chloride: 100 mmol/L (ref 98–111)
Creatinine, Ser: 0.61 mg/dL (ref 0.44–1.00)
GFR, Estimated: >60 mL/min (ref >60)
Glucose, Bld: 108 mg/dL — ABNORMAL HIGH (ref 70–99)
Potassium: 4.1 mmol/L (ref 3.5–5.1)
Sodium: 136 mmol/L (ref 135–145)
Total Bilirubin: 0.7 mg/dL (ref 0.3–1.2)
Total Protein: 7.4 g/dL (ref 6.5–8.1)

## 2021-10-16 LAB — URINALYSIS, ROUTINE W REFLEX MICROSCOPIC
Bilirubin Urine: NEGATIVE
Glucose, UA: NEGATIVE mg/dL
Ketones, ur: >80 mg/dL — AB
Nitrite: NEGATIVE
Specific Gravity, Urine: 1.016 (ref 1.005–1.030)
pH: 6.5 (ref 5.0–8.0)

## 2021-10-16 LAB — CBC
HCT: 35.6 % — ABNORMAL LOW (ref 36.0–46.0)
Hemoglobin: 11.7 g/dL — ABNORMAL LOW (ref 12.0–15.0)
MCH: 26.7 pg (ref 26.0–34.0)
MCHC: 32.9 g/dL (ref 30.0–36.0)
MCV: 81.1 fL (ref 80.0–100.0)
Platelets: 454 10*3/uL — ABNORMAL HIGH (ref 150–400)
RBC: 4.39 MIL/uL (ref 3.87–5.11)
RDW: 12.1 % (ref 11.5–15.5)
WBC: 17 10*3/uL — ABNORMAL HIGH (ref 4.0–10.5)
nRBC: 0 % (ref 0.0–0.2)

## 2021-10-16 LAB — LIPASE, BLOOD: Lipase: <10 U/L — ABNORMAL LOW (ref 11–51)

## 2021-10-16 LAB — PREGNANCY, URINE: Preg Test, Ur: NEGATIVE

## 2021-10-16 MED ORDER — ACETAMINOPHEN 325 MG PO TABS
650.0000 mg | ORAL_TABLET | Freq: Once | ORAL | Status: AC | PRN
  Administered 2021-10-16: 650 mg via ORAL
  Filled 2021-10-16: qty 2

## 2021-10-16 MED ORDER — ONDANSETRON HCL 4 MG/2ML IJ SOLN
4.0000 mg | Freq: Once | INTRAMUSCULAR | Status: AC
  Administered 2021-10-17: 4 mg via INTRAVENOUS
  Filled 2021-10-16: qty 2

## 2021-10-16 MED ORDER — SODIUM CHLORIDE 0.9 % IV BOLUS
1000.0000 mL | Freq: Once | INTRAVENOUS | Status: AC
  Administered 2021-10-16: 1000 mL via INTRAVENOUS

## 2021-10-16 NOTE — ED Triage Notes (Signed)
Pt reports that she has had vomiting, abd pain, and "coughing up yellow stuff" since Wednesday.

## 2021-10-16 NOTE — ED Provider Notes (Signed)
[facility]   CSN: [id number] Arrival date & time: 10/16/21  1940     History {Add pertinent medical, surgical, social history, OB history to HPI:1} Chief Complaint  Patient presents with   Abdominal Pain   Emesis   Nasal Congestion    Cassandra Hill is a 26 y.o. female.  Patient presents to the emergency department for evaluation of flulike illness.  Patient reports that symptoms began 1 week ago.  She has had cough productive of green sputum, intermittent fever, nasal congestion, nausea, vomiting.  Patient denies any associated abdominal pain.  No urinary symptoms.      Home Medications Prior to Admission medications   Medication Sig Start Date End Date Taking? Authorizing Provider  acetaminophen (TYLENOL) 325 MG tablet Take 2 tablets (650 mg total) by mouth every 6 (six) hours as needed. 08/27/21   Sloan LeiterGray, Samuel A, DO  acetaminophen (TYLENOL) 500 MG tablet Take 1,000 mg by mouth every 6 (six) hours as needed. With Rizatriptan    [provider]  amoxicillin-clavulanate (AUGMENTIN) 875-125 MG tablet Take 1 tablet by mouth every 12 (twelve) hours. 09/05/21   Milagros Lollykstra, Richard S, MD  azithromycin (ZITHROMAX) 250 MG tablet Take 1 tablet (250 mg total) by mouth daily. Take first 2 tablets together, then 1 every day until finished. 04/09/21   Curatolo, Adam, DO  buPROPion (WELLBUTRIN XL) 150 MG 24 hr tablet Take 150 mg by mouth daily.    [provider]  doxycycline (VIBRAMYCIN) 100 MG capsule Take 1 capsule (100 mg total) by mouth 2 (two) times daily. 07/07/21   Edwin DadaGray, Alicia P, DO  erythromycin ophthalmic ointment Place a 1/2 inch ribbon of ointment into the left lower eyelid BID x 5 days 06/20/21   Fayrene Helperran, Bowie, PA-C  fluticasone [facility](FLONASE) 50 MCG/ACT nasal spray Place 1 spray into both nostrils daily. 10/12/21   Tanda RockersGray, Samuel A, DO  guaiFENesin-codeine 100-10 MG/5ML syrup Take 5 mLs by mouth 3 (three) times daily as needed for  cough. 11/10/20   Jacalyn LefevreHaviland, Julie, MD  hydrOXYzine (ATARAX/VISTARIL) 10 MG tablet Take 10 mg by mouth 3 (three) times daily as needed.    [provider]  ibuprofen (ADVIL) 600 MG tablet Take 1 tablet (600 mg total) by mouth every 6 (six) hours as needed. 08/27/21   Sloan LeiterGray, Samuel A, DO  methylphenidate 18 MG PO CR tablet Take 18 mg by mouth daily.    [provider]  ondansetron (ZOFRAN ODT) 4 MG disintegrating tablet Take 1 tablet (4 mg total) by mouth every 8 (eight) hours as needed for nausea or vomiting. 01/09/21   Cardama, Amadeo GarnetPedro Eduardo, MD  ondansetron (ZOFRAN) 4 MG tablet Take 1 tablet (4 mg total) by mouth every 4 (four) hours as needed for nausea or vomiting. 10/12/21   Sloan LeiterGray, Samuel A, DO  rizatriptan (MAXALT) 10 MG tablet Take 10 mg by mouth as needed for migraine. May repeat in 2 hours if needed    [provider]  sucralfate (CARAFATE) 1 g tablet Take 1 tablet (1 g total) by mouth 4 (four) times daily -  with meals and at bedtime for 7 days. 10/12/21 10/23/21  Sloan LeiterGray, Samuel A, DO      Allergies    Patient has no known allergies.    Review of Systems   Review of Systems  Physical Exam Updated Vital Signs BP 105/72   Pulse 75   Temp 98.7 F (37.1 C) (Oral)   Resp 18  Ht 5\' 4"  (1.626 m)   Wt 72.6 kg   LMP 09/25/2021 (Approximate)   SpO2 95%   BMI 27.46 kg/m  Physical Exam Vitals and nursing note reviewed.  Constitutional:      General: She is not in acute distress.    Appearance: She is well-developed.  HENT:     Head: Normocephalic and atraumatic.     Mouth/Throat:     Mouth: Mucous membranes are moist.  Eyes:     General: Vision grossly intact. Gaze aligned appropriately.     Extraocular Movements: Extraocular movements intact.     Conjunctiva/sclera: Conjunctivae normal.  Cardiovascular:     Rate and Rhythm: Normal rate and regular rhythm.     Pulses: Normal pulses.     Heart sounds: Normal heart sounds, S1 normal and S2 normal. No murmur  heard.   No friction rub. No gallop.  Pulmonary:     Effort: Pulmonary effort is normal. No respiratory distress.     Breath sounds: Normal breath sounds.  Abdominal:     General: Bowel sounds are normal.     Palpations: Abdomen is soft.     Tenderness: There is no abdominal tenderness. There is no guarding or rebound.     Hernia: No hernia is present.  Musculoskeletal:        General: No swelling.     Cervical back: Full passive range of motion without pain, normal range of motion and neck supple. No spinous process tenderness or muscular tenderness. Normal range of motion.     Right lower leg: No edema.     Left lower leg: No edema.  Skin:    General: Skin is warm and dry.     Capillary Refill: Capillary refill takes less than 2 seconds.     Findings: No ecchymosis, erythema, rash or wound.  Neurological:     General: No focal deficit present.     Mental Status: She is alert and oriented to person, place, and time.     GCS: GCS eye subscore is 4. GCS verbal subscore is 5. GCS motor subscore is 6.     Cranial Nerves: Cranial nerves 2-12 are intact.     Sensory: Sensation is intact.     Motor: Motor function is intact.     Coordination: Coordination is intact.  Psychiatric:        Attention and Perception: Attention normal.        Mood and Affect: Mood normal.        Speech: Speech normal.        Behavior: Behavior normal.    ED Results / Procedures / Treatments   Labs (all labs ordered are listed, but only abnormal results are displayed) Labs Reviewed  LIPASE, BLOOD - Abnormal; Notable for the following components:      Result Value   Lipase <10 (*)    All other components within normal limits  COMPREHENSIVE METABOLIC PANEL - Abnormal; Notable for the following components:   Glucose, Bld 108 (*)    AST 8 (*)    All other components within normal limits  CBC - Abnormal; Notable for the following components:   WBC 17.0 (*)    Hemoglobin 11.7 (*)    HCT 35.6 (*)     Platelets 454 (*)    All other components within normal limits  URINALYSIS, ROUTINE W REFLEX MICROSCOPIC - Abnormal; Notable for the following components:   APPearance HAZY (*)    Hgb urine dipstick SMALL (*)  Ketones, ur >80 (*)    Protein, ur TRACE (*)    Leukocytes,Ua MODERATE (*)    Bacteria, UA RARE (*)    All other components within normal limits  PREGNANCY, URINE    EKG None  Radiology No results found.  Procedures Procedures  {Document cardiac monitor, telemetry assessment procedure when appropriate:1}  Medications Ordered in ED Medications  sodium chloride 0.9 % bolus 1,000 mL (has no administration in time range)  ondansetron (ZOFRAN) injection 4 mg (has no administration in time range)  acetaminophen (TYLENOL) tablet 650 mg (650 mg Oral Given 10/16/21 2019)    ED Course/ Medical Decision Making/ A&P                           Medical Decision Making Amount and/or Complexity of Data Reviewed Labs: ordered. Radiology: ordered.  Risk OTC drugs. Prescription drug management.   ***  {Document critical care time when appropriate:1} {Document review of labs and clinical decision tools ie heart score, Chads2Vasc2 etc:1}  {Document your independent review of radiology images, and any outside records:1} {Document your discussion with family members, caretakers, and with consultants:1} {Document social determinants of health affecting pt's care:1} {Document your decision making why or why not admission, treatments were needed:1} Final Clinical Impression(s) / ED Diagnoses Final diagnoses:  None    Rx / DC Orders ED Discharge Orders     None

## 2021-10-17 ENCOUNTER — Emergency Department (HOSPITAL_BASED_OUTPATIENT_CLINIC_OR_DEPARTMENT_OTHER): Payer: Medicaid Other | Admitting: Radiology

## 2021-10-17 MED ORDER — LEVOFLOXACIN 750 MG PO TABS
750.0000 mg | ORAL_TABLET | Freq: Once | ORAL | Status: AC
  Administered 2021-10-17: 750 mg via ORAL
  Filled 2021-10-17: qty 1

## 2021-10-17 MED ORDER — ONDANSETRON 4 MG PO TBDP: ORAL_TABLET | ORAL | 0 refills | Status: DC

## 2021-10-17 MED ORDER — LEVOFLOXACIN 750 MG PO TABS: 750.0000 mg | ORAL_TABLET | Freq: Every day | ORAL | 0 refills | Status: DC

## 2021-10-17 NOTE — ED Notes (Signed)
Pt verbalizes understanding of discharge instructions. Opportunity for questioning and answers were provided. Pt discharged from ED to home.   ? ?

## 2021-11-16 ENCOUNTER — Emergency Department (HOSPITAL_BASED_OUTPATIENT_CLINIC_OR_DEPARTMENT_OTHER)
Admission: EM | Admit: 2021-11-16 | Discharge: 2021-11-16 | Disposition: A | Discharge status: Home / Self Care | Payer: Medicaid Other | Attending: Emergency Medicine | Admitting: Emergency Medicine

## 2021-11-16 ENCOUNTER — Emergency Department (HOSPITAL_BASED_OUTPATIENT_CLINIC_OR_DEPARTMENT_OTHER): Payer: Medicaid Other | Admitting: Radiology

## 2021-11-16 ENCOUNTER — Encounter (HOSPITAL_BASED_OUTPATIENT_CLINIC_OR_DEPARTMENT_OTHER): Payer: Self-pay

## 2021-11-16 ENCOUNTER — Other Ambulatory Visit: Payer: Self-pay

## 2021-11-16 DIAGNOSIS — B349 Viral infection, unspecified: Secondary | ICD-10-CM

## 2021-11-16 DIAGNOSIS — J029 Acute pharyngitis, unspecified: Secondary | ICD-10-CM | POA: Diagnosis present

## 2021-11-16 LAB — GROUP A STREP BY PCR: Group A Strep by PCR: NOT DETECTED

## 2021-11-16 MED ORDER — ACETAMINOPHEN 500 MG PO TABS
1000.0000 mg | ORAL_TABLET | Freq: Once | ORAL | Status: AC
  Administered 2021-11-16: 1000 mg via ORAL
  Filled 2021-11-16: qty 2

## 2021-11-16 MED ORDER — DEXAMETHASONE SODIUM PHOSPHATE 10 MG/ML IJ SOLN
8.0000 mg | Freq: Once | INTRAMUSCULAR | Status: AC
Start: 2021-11-16 — End: 2021-11-16
  Administered 2021-11-16: 8 mg via INTRAMUSCULAR
  Filled 2021-11-16: qty 1

## 2021-11-16 NOTE — ED Triage Notes (Signed)
"  Sore throat that makes it hard to swallow" per pt

## 2021-11-16 NOTE — ED Provider Notes (Signed)
[facility] EMERGENCY DEPT Provider Note   CSN: [id number] Arrival date & time: 11/16/21  1743     History {Add pertinent medical, surgical, social history, OB history to HPI:1} Chief Complaint  Patient presents with   Sore Throat    Cassandra Hill is a 26 y.o. female.   Sore Throat  Patient is a 26 year old female present emergency room today with complaints of sore throat cough congestion fatigue fevers for the past 2 days.  Her son has similar symptoms.  She denies any chest pain or difficulty breathing.  She states that she had "walking pneumonia" a few weeks ago.  She states she does not feel quite as bad as she did then.  She does not take any medications for her symptoms.     Home Medications Prior to Admission medications   Medication Sig Start Date End Date Taking? Authorizing Provider  acetaminophen (TYLENOL) 325 MG tablet Take 2 tablets (650 mg total) by mouth every 6 (six) hours as needed. 08/27/21   Sloan Leiter, DO  acetaminophen (TYLENOL) 500 MG tablet Take 1,000 mg by mouth every 6 (six) hours as needed. With Rizatriptan    [provider]  buPROPion (WELLBUTRIN XL) 150 MG 24 hr tablet Take 150 mg by mouth daily.    [provider]  hydrOXYzine (ATARAX/VISTARIL) 10 MG tablet Take 10 mg by mouth 3 (three) times daily as needed.    [provider]  levofloxacin (LEVAQUIN) 750 MG tablet Take 1 tablet (750 mg total) by mouth at bedtime. 10/17/21   Gilda Crease, MD  methylphenidate 18 MG PO CR tablet Take 18 mg by mouth daily.    [provider]  ondansetron (ZOFRAN-ODT) 4 MG disintegrating tablet 4mg  ODT q4 hours prn nausea/vomit 10/17/21   Pollina, 12/17/21, MD  rizatriptan (MAXALT) 10 MG tablet Take 10 mg by mouth as needed for migraine. May repeat in 2 hours if needed    [provider]      Allergies    Patient has no known allergies.    Review of Systems   Review of  Systems  Physical Exam Updated Vital Signs BP 134/89   Pulse 93   Temp (!) 102.2 F (39 C) (Oral)   Resp 19   Ht 5\' 4"  (1.626 m)   Wt 59 kg   LMP 09/25/2021 (Approximate)   SpO2 98%   BMI 22.31 kg/m  Physical Exam  ED Results / Procedures / Treatments   Labs (all labs ordered are listed, but only abnormal results are displayed) Labs Reviewed  GROUP A STREP BY PCR    EKG None  Radiology No results found.  Procedures Procedures  {Document cardiac monitor, telemetry assessment procedure when appropriate:1}  Medications Ordered in ED Medications  dexamethasone (DECADRON) injection 8 mg (has no administration in time range)  acetaminophen (TYLENOL) tablet 1,000 mg (1,000 mg Oral Given 11/16/21 2134)    ED Course/ Medical Decision Making/ A&P                           Medical Decision Making Amount and/or Complexity of Data Reviewed Radiology: ordered.  Risk OTC drugs. Prescription drug management.   Patient is a 26 year old female present emergency room today with complaints of sore throat cough congestion fatigue fevers for the past 2 days.  Her son has similar symptoms.  She denies any chest pain or difficulty breathing.  She states that she had "walking  pneumonia" a few weeks ago.  She states she does not feel quite as bad as she did then.  She does not take any medications for her symptoms.  {Document critical care time when appropriate:1} {Document review of labs and clinical decision tools ie heart score, Chads2Vasc2 etc:1}  {Document your independent review of radiology images, and any outside records:1} {Document your discussion with family members, caretakers, and with consultants:1} {Document social determinants of health affecting pt's care:1} {Document your decision making why or why not admission, treatments were needed:1} Final Clinical Impression(s) / ED Diagnoses Final diagnoses:  None    Rx / DC Orders ED Discharge Orders     None

## 2021-11-16 NOTE — Discharge Instructions (Signed)
Your chest x-ray is without any acute abnormalities.  Your symptoms likely represent a viral illness  Please take Tylenol and ibuprofen as detailed below.  Drink plenty of water.  You may use Nettie pot to rinse out your sinuses.  Please use Tylenol or ibuprofen for pain.  You may use 600 mg ibuprofen every 6 hours or 1000 mg of Tylenol every 6 hours.  You may choose to alternate between the 2.  This would be most effective.  Not to exceed 4 g of Tylenol within 24 hours.  Not to exceed 3200 mg ibuprofen 24 hours.     Viral Illness TREATMENT  Treatment is directed at relieving symptoms. There is no cure. Antibiotics are not effective, because the infection is caused by a virus, not by bacteria. Treatment may include:  Increased fluid intake. Sports drinks offer valuable electrolytes, sugars, and fluids.  Breathing heated mist or steam (vaporizer or shower).  Eating chicken soup or other clear broths, and maintaining good nutrition.  Getting plenty of rest.  Using gargles or lozenges for comfort.  Increasing usage of your inhaler if you have asthma.  Return to work when your temperature has returned to normal.  Gargle warm salt water and spit it out for sore throat. Take benadryl to decrease sinus secretions. Continue to alternate between Tylenol and ibuprofen for pain and fever control.  Follow Up: Follow up with your primary care doctor in 5-7 days for recheck of ongoing symptoms.  Return to emergency department for emergent changing or worsening of symptoms.

## 2022-01-07 ENCOUNTER — Other Ambulatory Visit (HOSPITAL_BASED_OUTPATIENT_CLINIC_OR_DEPARTMENT_OTHER): Payer: Self-pay

## 2022-01-07 ENCOUNTER — Emergency Department (HOSPITAL_BASED_OUTPATIENT_CLINIC_OR_DEPARTMENT_OTHER)
Admission: EM | Admit: 2022-01-07 | Discharge: 2022-01-07 | Disposition: A | Discharge status: Home / Self Care | Payer: Medicaid Other | Attending: Emergency Medicine | Admitting: Emergency Medicine

## 2022-01-07 ENCOUNTER — Encounter (HOSPITAL_BASED_OUTPATIENT_CLINIC_OR_DEPARTMENT_OTHER): Payer: Self-pay | Admitting: Emergency Medicine

## 2022-01-07 ENCOUNTER — Other Ambulatory Visit: Payer: Self-pay

## 2022-01-07 DIAGNOSIS — M545 Low back pain, unspecified: Secondary | ICD-10-CM | POA: Diagnosis present

## 2022-01-07 DIAGNOSIS — M7918 Myalgia, other site: Secondary | ICD-10-CM

## 2022-01-07 MED ORDER — METHOCARBAMOL 500 MG PO TABS
500.0000 mg | ORAL_TABLET | Freq: Two times a day (BID) | ORAL | 0 refills | Status: DC
  Filled 2022-01-07: qty 20, 10d supply, fill #0

## 2022-01-07 MED ORDER — OXYCODONE-ACETAMINOPHEN 5-325 MG PO TABS: 1.0000 | ORAL_TABLET | Freq: Once | ORAL | Status: DC

## 2022-01-07 MED ORDER — IBUPROFEN 600 MG PO TABS
600.0000 mg | ORAL_TABLET | Freq: Four times a day (QID) | ORAL | 0 refills | Status: DC | PRN
  Filled 2022-01-07: qty 30, 8d supply, fill #0

## 2022-01-07 MED ORDER — DIAZEPAM 5 MG PO TABS
5.0000 mg | ORAL_TABLET | Freq: Once | ORAL | Status: AC
  Administered 2022-01-07: 5 mg via ORAL
  Filled 2022-01-07: qty 1

## 2022-01-07 MED ORDER — OXYCODONE-ACETAMINOPHEN 5-325 MG PO TABS
1.0000 | ORAL_TABLET | Freq: Once | ORAL | Status: AC
  Administered 2022-01-07: 1 via ORAL
  Filled 2022-01-07: qty 1

## 2022-01-07 MED ORDER — KETOROLAC TROMETHAMINE 60 MG/2ML IM SOLN
30.0000 mg | Freq: Once | INTRAMUSCULAR | Status: AC
  Administered 2022-01-07: 30 mg via INTRAMUSCULAR
  Filled 2022-01-07: qty 2

## 2022-01-07 NOTE — ED Notes (Signed)
Patient verbalizes understanding of discharge instructions. Opportunity for questioning and answers were provided. Patient discharged from ED.  °

## 2022-01-07 NOTE — ED Provider Notes (Signed)
[facility] EMERGENCY DEPT Provider Note   CSN: [id number] Arrival date & time: 01/07/22  4098     History  Chief Complaint  Patient presents with   Back Pain    Cassandra Hill is a 26 y.o. female.  26 year old female presents with 2 days of lower back pain.  History of back injury several years ago.  Denies any current injury.  Pain is characterized as sharp and worse when she stands or tries to walk.  No loss of bowel or bladder function.  Denies any urinary symptoms.  No rashes.  Pain better with remaining still no treatment use prior to arrival       Home Medications Prior to Admission medications   Medication Sig Start Date End Date Taking? Authorizing Provider  acetaminophen (TYLENOL) 325 MG tablet Take 2 tablets (650 mg total) by mouth every 6 (six) hours as needed. 08/27/21   Sloan Leiter, DO  acetaminophen (TYLENOL) 500 MG tablet Take 1,000 mg by mouth every 6 (six) hours as needed. With Rizatriptan    [provider]  buPROPion (WELLBUTRIN XL) 150 MG 24 hr tablet Take 150 mg by mouth daily.    [provider]  hydrOXYzine (ATARAX/VISTARIL) 10 MG tablet Take 10 mg by mouth 3 (three) times daily as needed.    [provider]  levofloxacin (LEVAQUIN) 750 MG tablet Take 1 tablet (750 mg total) by mouth at bedtime. 10/17/21   Gilda Crease, MD  methylphenidate 18 MG PO CR tablet Take 18 mg by mouth daily.    [provider]  ondansetron (ZOFRAN-ODT) 4 MG disintegrating tablet 4mg  ODT q4 hours prn nausea/vomit 10/17/21   Pollina, 12/17/21, MD  rizatriptan (MAXALT) 10 MG tablet Take 10 mg by mouth as needed for migraine. May repeat in 2 hours if needed    [provider]      Allergies    Patient has no known allergies.    Review of Systems   Review of Systems  All other systems reviewed and are negative.   Physical Exam Updated Vital Signs BP 113/67 (BP Location: Left Arm)   Pulse 69   Temp  98.3 F (36.8 C) (Oral)   Resp 14   Ht 1.626 m (5\' 4" )   Wt 60 kg   SpO2 99%   BMI 22.71 kg/m  Physical Exam Vitals and nursing note reviewed.  Constitutional:      General: She is not in acute distress.    Appearance: Normal appearance. She is well-developed. She is not toxic-appearing.  HENT:     Head: Normocephalic and atraumatic.  Eyes:     General: Lids are normal.     Conjunctiva/sclera: Conjunctivae normal.     Pupils: Pupils are equal, round, and reactive to light.  Neck:     Thyroid: No thyroid mass.     Trachea: No tracheal deviation.  Cardiovascular:     Rate and Rhythm: Normal rate and regular rhythm.     Heart sounds: Normal heart sounds. No murmur heard.    No gallop.  Pulmonary:     Effort: Pulmonary effort is normal. No respiratory distress.     Breath sounds: Normal breath sounds. No stridor. No decreased breath sounds, wheezing, rhonchi or rales.  Abdominal:     General: There is no distension.     Palpations: Abdomen is soft.     Tenderness: There is no abdominal tenderness. There is no rebound.  Musculoskeletal:  General: No tenderness. Normal range of motion.     Cervical back: Normal range of motion and neck supple.       Back:  Skin:    General: Skin is warm and dry.     Findings: No abrasion or rash.  Neurological:     Mental Status: She is alert and oriented to person, place, and time. Mental status is at baseline.     GCS: GCS eye subscore is 4. GCS verbal subscore is 5. GCS motor subscore is 6.     Cranial Nerves: No cranial nerve deficit.     Sensory: No sensory deficit.     Motor: Motor function is intact.     Gait: Gait is intact.     Comments: Strength is 5 of 5 in bilateral lower extremities  Psychiatric:        Attention and Perception: Attention normal.        Speech: Speech normal.        Behavior: Behavior normal.     ED Results / Procedures / Treatments   Labs (all labs ordered are listed, but only abnormal results  are displayed) Labs Reviewed - No data to display  EKG None  Radiology No results found.  Procedures Procedures    Medications Ordered in ED Medications  ketorolac (TORADOL) injection 30 mg (has no administration in time range)  diazepam (VALIUM) tablet 5 mg (has no administration in time range)  oxyCODONE-acetaminophen (PERCOCET/ROXICET) 5-325 MG per tablet 1 tablet (has no administration in time range)    ED Course/ Medical Decision Making/ A&P                           Medical Decision Making Risk Prescription drug management.   Patient medicated for pain here and feels slightly better.  No indication for imaging at this time.  Suspect musculoskeletal etiology.  Neurologic exam is stable.  Will discharge home        Final Clinical Impression(s) / ED Diagnoses Final diagnoses:  None    Rx / DC Orders ED Discharge Orders     None         Lorre Nick, MD 01/07/22 [phone]

## 2022-01-07 NOTE — ED Triage Notes (Signed)
Pt arrives to ED with c/o lower back pain that started yesterday.

## 2022-01-14 ENCOUNTER — Encounter (HOSPITAL_BASED_OUTPATIENT_CLINIC_OR_DEPARTMENT_OTHER): Payer: Self-pay | Admitting: Emergency Medicine

## 2022-01-14 ENCOUNTER — Emergency Department (HOSPITAL_BASED_OUTPATIENT_CLINIC_OR_DEPARTMENT_OTHER): Payer: Medicaid Other

## 2022-01-14 ENCOUNTER — Emergency Department (HOSPITAL_BASED_OUTPATIENT_CLINIC_OR_DEPARTMENT_OTHER)
Admission: EM | Admit: 2022-01-14 | Discharge: 2022-01-14 | Disposition: A | Discharge status: Home / Self Care | Payer: Medicaid Other | Attending: Emergency Medicine | Admitting: Emergency Medicine

## 2022-01-14 DIAGNOSIS — E871 Hypo-osmolality and hyponatremia: Secondary | ICD-10-CM | POA: Insufficient documentation

## 2022-01-14 DIAGNOSIS — R197 Diarrhea, unspecified: Secondary | ICD-10-CM

## 2022-01-14 DIAGNOSIS — R109 Unspecified abdominal pain: Secondary | ICD-10-CM | POA: Diagnosis present

## 2022-01-14 DIAGNOSIS — R110 Nausea: Secondary | ICD-10-CM

## 2022-01-14 DIAGNOSIS — K8020 Calculus of gallbladder without cholecystitis without obstruction: Secondary | ICD-10-CM | POA: Diagnosis not present

## 2022-01-14 DIAGNOSIS — R1084 Generalized abdominal pain: Secondary | ICD-10-CM

## 2022-01-14 LAB — COMPREHENSIVE METABOLIC PANEL
ALT: 13 U/L (ref 0–44)
AST: 13 U/L — ABNORMAL LOW (ref 15–41)
Albumin: 4.4 g/dL (ref 3.5–5.0)
Alkaline Phosphatase: 81 U/L (ref 38–126)
Anion gap: 10 (ref 5–15)
BUN: 12 mg/dL (ref 6–20)
CO2: 24 mmol/L (ref 22–32)
Calcium: 9.1 mg/dL (ref 8.9–10.3)
Chloride: 99 mmol/L (ref 98–111)
Creatinine, Ser: 0.6 mg/dL (ref 0.44–1.00)
GFR, Estimated: >60 mL/min (ref >60)
Glucose, Bld: 101 mg/dL — ABNORMAL HIGH (ref 70–99)
Potassium: 3.9 mmol/L (ref 3.5–5.1)
Sodium: 133 mmol/L — ABNORMAL LOW (ref 135–145)
Total Bilirubin: 0.6 mg/dL (ref 0.3–1.2)
Total Protein: 7.2 g/dL (ref 6.5–8.1)

## 2022-01-14 LAB — CBC
HCT: 40.8 % (ref 36.0–46.0)
Hemoglobin: 14 g/dL (ref 12.0–15.0)
MCH: 27.6 pg (ref 26.0–34.0)
MCHC: 34.3 g/dL (ref 30.0–36.0)
MCV: 80.5 fL (ref 80.0–100.0)
Platelets: 334 10*3/uL (ref 150–400)
RBC: 5.07 MIL/uL (ref 3.87–5.11)
RDW: 12.9 % (ref 11.5–15.5)
WBC: 9 10*3/uL (ref 4.0–10.5)
nRBC: 0 % (ref 0.0–0.2)

## 2022-01-14 LAB — URINALYSIS, ROUTINE W REFLEX MICROSCOPIC
Bilirubin Urine: NEGATIVE
Glucose, UA: NEGATIVE mg/dL
Ketones, ur: NEGATIVE mg/dL
Nitrite: NEGATIVE
Specific Gravity, Urine: 1.019 (ref 1.005–1.030)
pH: 8.5 — ABNORMAL HIGH (ref 5.0–8.0)

## 2022-01-14 LAB — PREGNANCY, URINE: Preg Test, Ur: NEGATIVE

## 2022-01-14 LAB — LIPASE, BLOOD: Lipase: 15 U/L (ref 11–51)

## 2022-01-14 MED ORDER — DICYCLOMINE HCL 20 MG PO TABS
20.0000 mg | ORAL_TABLET | Freq: Two times a day (BID) | ORAL | 0 refills | Status: DC
Start: 2022-01-14 — End: 2023-04-10

## 2022-01-14 MED ORDER — ONDANSETRON HCL 4 MG/2ML IJ SOLN
4.0000 mg | Freq: Once | INTRAMUSCULAR | Status: AC
  Administered 2022-01-14: 4 mg via INTRAVENOUS
  Filled 2022-01-14: qty 2

## 2022-01-14 MED ORDER — SODIUM CHLORIDE 0.9 % IV BOLUS
1000.0000 mL | Freq: Once | INTRAVENOUS | Status: AC
  Administered 2022-01-14: 1000 mL via INTRAVENOUS

## 2022-01-14 MED ORDER — ONDANSETRON 4 MG PO TBDP: 4.0000 mg | ORAL_TABLET | Freq: Three times a day (TID) | ORAL | 0 refills | Status: DC | PRN

## 2022-01-14 MED ORDER — KETOROLAC TROMETHAMINE 15 MG/ML IJ SOLN
15.0000 mg | Freq: Once | INTRAMUSCULAR | Status: AC
  Administered 2022-01-14: 15 mg via INTRAVENOUS
  Filled 2022-01-14: qty 1

## 2022-01-14 MED ORDER — DICYCLOMINE HCL 10 MG PO CAPS
10.0000 mg | ORAL_CAPSULE | Freq: Once | ORAL | Status: AC
Start: 2022-01-14 — End: 2022-01-14
  Administered 2022-01-14: 10 mg via ORAL
  Filled 2022-01-14: qty 1

## 2022-01-14 MED ORDER — LOPERAMIDE HCL 2 MG PO CAPS: 2.0000 mg | ORAL_CAPSULE | Freq: Four times a day (QID) | ORAL | 0 refills | Status: DC | PRN

## 2022-01-14 NOTE — ED Triage Notes (Signed)
Pt reports mid lower abdominal pain and diarrhea since last night. Known h/o gallstones and IBS.  Took immodium last night w/o relief.  3-4 BM's this morning and several overnight.

## 2022-01-14 NOTE — ED Provider Notes (Signed)
[facility] EMERGENCY DEPT Provider Note   CSN: [id number] Arrival date & time: 01/14/22  2979     History  Chief Complaint  Patient presents with   Abdominal Pain    Cassandra Hill is a 26 y.o. female.  Patient with history of gallstones presents emergency department today for generalized abdominal pain.  She states that she was in contact with someone recently had "food poisoning".  Symptoms started yesterday morning.  Patient has generalized abdominal pain without radiation.  She has had nausea but no vomiting.  She also reports diarrhea without blood.  No urinary symptoms.  She has taken medication for diarrhea and nausea medicine which helped somewhat.  She is concerned that pain is related to gallstones today.  She denies fever.       Home Medications Prior to Admission medications   Medication Sig Start Date End Date Taking? Authorizing Provider  acetaminophen (TYLENOL) 325 MG tablet Take 2 tablets (650 mg total) by mouth every 6 (six) hours as needed. 08/27/21   Sloan Leiter, DO  acetaminophen (TYLENOL) 500 MG tablet Take 1,000 mg by mouth every 6 (six) hours as needed. With Rizatriptan    [provider]  buPROPion (WELLBUTRIN XL) 150 MG 24 hr tablet Take 150 mg by mouth daily.    [provider]  hydrOXYzine (ATARAX/VISTARIL) 10 MG tablet Take 10 mg by mouth 3 (three) times daily as needed.    [provider]  ibuprofen (ADVIL) 600 MG tablet Take 1 tablet (600 mg total) by mouth every 6 (six) hours as needed. 01/07/22   Lorre Nick, MD  levofloxacin (LEVAQUIN) 750 MG tablet Take 1 tablet (750 mg total) by mouth at bedtime. 10/17/21   Gilda Crease, MD  methocarbamol (ROBAXIN) 500 MG tablet Take 1 tablet (500 mg total) by mouth 2 (two) times daily. 01/07/22   Lorre Nick, MD  methylphenidate 18 MG PO CR tablet Take 18 mg by mouth daily.    [provider]  ondansetron (ZOFRAN-ODT) 4 MG disintegrating tablet  4mg  ODT q4 hours prn nausea/vomit 10/17/21   Pollina, 12/17/21, MD  rizatriptan (MAXALT) 10 MG tablet Take 10 mg by mouth as needed for migraine. May repeat in 2 hours if needed    [provider]      Allergies    Patient has no known allergies.    Review of Systems   Review of Systems  Physical Exam Updated Vital Signs BP 123/73 (BP Location: Right Arm)   Pulse 87   Temp 98.7 F (37.1 C) (Oral)   Resp 16   LMP 01/13/2022 (Exact Date) Comment: pt has minimal cycles and occasional light bleeding with her IUD  SpO2 100%   Physical Exam Vitals and nursing note reviewed.  Constitutional:      General: She is not in acute distress.    Appearance: She is well-developed.  HENT:     Head: Normocephalic and atraumatic.     Right Ear: External ear normal.     Left Ear: External ear normal.     Nose: Nose normal.  Eyes:     Conjunctiva/sclera: Conjunctivae normal.  Cardiovascular:     Rate and Rhythm: Normal rate and regular rhythm.     Heart sounds: No murmur heard. Pulmonary:     Effort: No respiratory distress.     Breath sounds: No wheezing, rhonchi or rales.  Abdominal:     Palpations: Abdomen is soft.     Tenderness: There is  generalized abdominal tenderness (no focal). There is no guarding or rebound. Negative signs include Murphy's sign and McBurney's sign.  Musculoskeletal:     Cervical back: Normal range of motion and neck supple.     Right lower leg: No edema.     Left lower leg: No edema.  Skin:    General: Skin is warm and dry.     Findings: No rash.  Neurological:     General: No focal deficit present.     Mental Status: She is alert. Mental status is at baseline.     Motor: No weakness.  Psychiatric:        Mood and Affect: Mood normal.     ED Results / Procedures / Treatments   Labs (all labs ordered are listed, but only abnormal results are displayed) Labs Reviewed  COMPREHENSIVE METABOLIC PANEL - Abnormal; Notable for the following  components:      Result Value   Sodium 133 (*)    Glucose, Bld 101 (*)    AST 13 (*)    All other components within normal limits  URINALYSIS, ROUTINE W REFLEX MICROSCOPIC - Abnormal; Notable for the following components:   pH 8.5 (*)    Hgb urine dipstick SMALL (*)    Protein, ur TRACE (*)    Leukocytes,Ua SMALL (*)    All other components within normal limits  LIPASE, BLOOD  CBC  PREGNANCY, URINE    EKG None  Radiology US Abdomen Limited RUQ (LIVER/GB)  Result Date: 01/14/2022 CLINICAL DATA:  Abdominal cramping. EXAM: ULTRASOUND ABDOMEN LIMITED RIGHT UPPER QUADRANT COMPARISON:  April 17, 2020. FINDINGS: Gallbladder: Cholelithiasis is noted without gallbladder wall thickening or pericholecystic fluid. No sonographic Murphy's sign is noted. Common bile duct: Diameter: 4 mm which is within normal limits. Liver: No focal lesion identified. Within normal limits in parenchymal echogenicity. Portal vein is patent on color Doppler imaging with normal direction of blood flow towards the liver. Other: None. IMPRESSION: Cholelithiasis without evidence of cholecystitis. Electronically Signed   By: Lupita Raider M.D.   On: 01/14/2022 10:21    Procedures Procedures    Medications Ordered in ED Medications - No data to display  ED Course/ Medical Decision Making/ A&P    Patient seen and examined.  She said that she would like to get her gallbladder out today.  Explained indications why this would need to be done, feel this is low likelihood today.  History obtained directly from patient. Work-up including labs, imaging, EKG ordered in triage, if performed, were reviewed.    Labs/EKG: CBC, CMP, lipase, pregnancy, UA ordered and pending.  Imaging: Right upper quadrant ultrasound ordered.  Medications/Fluids: Ordered: Toradol, Zofran, IV fluid bolus, oral Bentyl  Most recent vital signs reviewed and are as follows: BP 123/73 (BP Location: Right Arm)   Pulse 87   Temp 98.7 F (37.1  C) (Oral)   Resp 16   LMP 01/13/2022 (Exact Date) Comment: pt has minimal cycles and occasional light bleeding with her IUD  SpO2 100%   Initial impression: Generalized abdominal pain and diarrhea  11:51 AM Reassessment performed. Patient appears comfortable.  She states that she is feeling a bit better.  Labs personally reviewed and interpreted including: CBC unremarkable; CMP with mild hyponatremia treated with IV fluids, glucose 101, normal kidney function, normal liver function testing; lipase normal; UA without compelling signs of infection; pregnancy negative.  Imaging results personally reviewed: Demonstrates cholelithiasis without signs of cholecystitis  Reviewed pertinent lab work and imaging  with patient at bedside. Questions answered.   Most current vital signs reviewed and are as follows: BP 123/73 (BP Location: Right Arm)   Pulse 87   Temp 98.7 F (37.1 C) (Oral)   Resp 16   LMP 01/13/2022 (Exact Date) Comment: pt has minimal cycles and occasional light bleeding with her IUD  SpO2 100%   Plan: Discharge to home.   Prescriptions written for: Zofran, Bentyl, Imodium  Other home care instructions discussed: Clear liquids and bland diet  ED return instructions discussed: The patient was urged to return to the Emergency Department immediately with worsening of current symptoms, worsening abdominal pain, persistent vomiting, blood noted in stools, fever, or any other concerns. The patient verbalized understanding.   Follow-up instructions discussed: Patient encouraged to follow-up with their PCP in 3 days for recheck of symptoms.  Encouraged follow-up with surgery referral provided for evaluation of cholelithiasis.                           Medical Decision Making Amount and/or Complexity of Data Reviewed Labs: ordered. Radiology: ordered.  Risk Prescription drug management.   For this patient's complaint of abdominal pain, the following conditions were considered  on the differential diagnosis: gastritis/PUD, enteritis/duodenitis, appendicitis, cholelithiasis/cholecystitis, cholangitis, pancreatitis, ruptured viscus, colitis, diverticulitis, small/large bowel obstruction, proctitis, cystitis, pyelonephritis, ureteral colic, aortic dissection, aortic aneurysm. In women, ectopic pregnancy, pelvic inflammatory disease, ovarian cysts, and tubo-ovarian abscess were also considered. Atypical chest etiologies were also considered including ACS, PE, and pneumonia.   Generalized abdominal pain and diarrhea make me less concern for symptomatic cholelithiasis.  No white count to suggest significant cholecystitis.  Do feel that she would benefit from surgery evaluation as outpatient.  The patient's vital signs, pertinent lab work and imaging were reviewed and interpreted as discussed in the ED course. Hospitalization was considered for further testing, treatments, or serial exams/observation. However as patient is well-appearing, has a stable exam, and reassuring studies today, I do not feel that they warrant admission at this time. This plan was discussed with the patient who verbalizes agreement and comfort with this plan and seems reliable and able to return to the Emergency Department with worsening or changing symptoms.          Final Clinical Impression(s) / ED Diagnoses Final diagnoses:  Generalized abdominal pain  Nausea without vomiting  Diarrhea, unspecified type  Gallstones    Rx / DC Orders ED Discharge Orders          Ordered    dicyclomine (BENTYL) 20 MG tablet  2 times daily        01/14/22 1149    ondansetron (ZOFRAN-ODT) 4 MG disintegrating tablet  Every 8 hours PRN        01/14/22 1149    loperamide (IMODIUM) 2 MG capsule  4 times daily PRN        01/14/22 1149              Renne Crigler, PA-C 01/14/22 1153    Alvira Monday, MD 01/16/22 [phone]

## 2022-01-14 NOTE — Discharge Instructions (Addendum)
Please read and follow all provided instructions.  Your diagnoses today include:  1. Generalized abdominal pain   2. Nausea without vomiting   3. Diarrhea, unspecified type   4. Gallstones     Tests performed today include: Blood cell counts and platelets: Were normal Kidney and liver function tests: Sodium was slightly low, but no other concerns Pancreas function test (called lipase): Was normal Urine test to look for infection: No signs of infection A blood or urine test for pregnancy (women only): Negative pregnancy Ultrasound of your gallbladder: Shows signs of gallbladder without gallbladder infection, this will need to be followed up by surgery referral Vital signs. See below for your results today.   Medications prescribed:  Bentyl - medication for intestinal cramps and spasms  Zofran (ondansetron) - for nausea and vomiting  Take any prescribed medications only as directed.  Home care instructions:  Follow any educational materials contained in this packet.  Follow-up instructions: Please follow-up with your primary care provider in the next 3 days for further evaluation of your symptoms.  Please also call the surgery referral and try to be seen for the gallstones in about a week.  Return instructions:  SEEK IMMEDIATE MEDICAL ATTENTION IF: The pain does not go away or becomes severe  A temperature above 101F develops  Repeated vomiting occurs (multiple episodes)  The pain becomes localized to portions of the abdomen. The right side could possibly be appendicitis. In an adult, the left lower portion of the abdomen could be colitis or diverticulitis.  Blood is being passed in stools or vomit (bright red or black tarry stools)  You develop chest pain, difficulty breathing, dizziness or fainting, or become confused, poorly responsive, or inconsolable (young children) If you have any other emergent concerns regarding your health  Additional Information: Abdominal (belly)  pain can be caused by many things. Your caregiver performed an examination and possibly ordered blood/urine tests and imaging (CT scan, x-rays, ultrasound). Many cases can be observed and treated at home after initial evaluation in the emergency department. Even though you are being discharged home, abdominal pain can be unpredictable. Therefore, you need a repeated exam if your pain does not resolve, returns, or worsens. Most patients with abdominal pain don't have to be admitted to the hospital or have surgery, but serious problems like appendicitis and gallbladder attacks can start out as nonspecific pain. Many abdominal conditions cannot be diagnosed in one visit, so follow-up evaluations are very important.  Your vital signs today were: BP 123/73 (BP Location: Right Arm)   Pulse 87   Temp 98.7 F (37.1 C) (Oral)   Resp 16   LMP 01/13/2022 (Exact Date) Comment: pt has minimal cycles and occasional light bleeding with her IUD  SpO2 100%  If your blood pressure (bp) was elevated above 135/85 this visit, please have this repeated by your doctor within one month. --------------

## 2022-01-14 NOTE — ED Notes (Signed)
US at bedside

## 2022-02-05 ENCOUNTER — Emergency Department (HOSPITAL_BASED_OUTPATIENT_CLINIC_OR_DEPARTMENT_OTHER): Payer: Medicaid Other | Admitting: Radiology

## 2022-02-05 ENCOUNTER — Emergency Department (HOSPITAL_BASED_OUTPATIENT_CLINIC_OR_DEPARTMENT_OTHER)
Admission: EM | Admit: 2022-02-05 | Discharge: 2022-02-05 | Disposition: A | Discharge status: Home / Self Care | Payer: Medicaid Other | Attending: Emergency Medicine | Admitting: Emergency Medicine

## 2022-02-05 ENCOUNTER — Other Ambulatory Visit: Payer: Self-pay

## 2022-02-05 ENCOUNTER — Encounter (HOSPITAL_BASED_OUTPATIENT_CLINIC_OR_DEPARTMENT_OTHER): Payer: Self-pay | Admitting: *Deleted

## 2022-02-05 DIAGNOSIS — R052 Subacute cough: Secondary | ICD-10-CM

## 2022-02-05 DIAGNOSIS — R059 Cough, unspecified: Secondary | ICD-10-CM | POA: Diagnosis present

## 2022-02-05 MED ORDER — ALBUTEROL SULFATE HFA 108 (90 BASE) MCG/ACT IN AERS
2.0000 | INHALATION_SPRAY | RESPIRATORY_TRACT | Status: DC | PRN
  Administered 2022-02-05: 2 via RESPIRATORY_TRACT
  Filled 2022-02-05: qty 6.7

## 2022-02-05 MED ORDER — AEROCHAMBER PLUS FLO-VU MEDIUM MISC
1.0000 | Freq: Once | Status: AC
  Administered 2022-02-05: 1
  Filled 2022-02-05: qty 1

## 2022-02-05 NOTE — ED Triage Notes (Signed)
Pt is here for evaluation of cough x3 weeks.  Pt reports that this is productive for green phlegm.  No fever or chills, no sob.

## 2022-02-05 NOTE — ED Provider Notes (Signed)
[facility] EMERGENCY DEPT Provider Note   CSN: [id number] Arrival date & time: 02/05/22  0755     History  Chief Complaint  Patient presents with   Cough    Cassandra Hill is a 26 y.o. female.  HPI 26 year old female presents today complaining of cough.  She states she has had some cough for 3 weeks.  She is coughing up some discolored phlegm.  She has not had fever or chills or shortness of breath.  Her child has had similar symptoms.  She denies any dyspnea.  She denies any history of asthma.  She is not a smoker    Home Medications Prior to Admission medications   Medication Sig Start Date End Date Taking? Authorizing Provider  acetaminophen (TYLENOL) 500 MG tablet Take 1,000 mg by mouth every 6 (six) hours as needed. With Rizatriptan    [provider]  buPROPion (WELLBUTRIN XL) 150 MG 24 hr tablet Take 150 mg by mouth daily.    [provider]  dicyclomine (BENTYL) 20 MG tablet Take 1 tablet (20 mg total) by mouth 2 (two) times daily. 01/14/22   Carlisle Cater, PA-C  hydrOXYzine (ATARAX/VISTARIL) 10 MG tablet Take 10 mg by mouth 3 (three) times daily as needed.    [provider]  ibuprofen (ADVIL) 600 MG tablet Take 1 tablet (600 mg total) by mouth every 6 (six) hours as needed. 01/07/22   Lacretia Leigh, MD  loperamide (IMODIUM) 2 MG capsule Take 1 capsule (2 mg total) by mouth 4 (four) times daily as needed for diarrhea or loose stools. 01/14/22   Carlisle Cater, PA-C  methocarbamol (ROBAXIN) 500 MG tablet Take 1 tablet (500 mg total) by mouth 2 (two) times daily. 01/07/22   Lacretia Leigh, MD  methylphenidate 18 MG PO CR tablet Take 18 mg by mouth daily.    [provider]  omeprazole (PRILOSEC) 40 MG capsule Take 40 mg by mouth daily. 12/05/21   [provider]  ondansetron (ZOFRAN-ODT) 4 MG disintegrating tablet Take 1 tablet (4 mg total) by mouth every 8 (eight) hours as needed for nausea or vomiting. 01/14/22    Carlisle Cater, PA-C  rizatriptan (MAXALT) 10 MG tablet Take 10 mg by mouth as needed for migraine. May repeat in 2 hours if needed    [provider]      Allergies    Patient has no known allergies.    Review of Systems   Review of Systems  Physical Exam Updated Vital Signs BP 122/74 (BP Location: Right Arm)   Pulse 61   Temp 97.9 F (36.6 C)   Resp 16   Ht 1.575 m (5\' 2" )   Wt 71.3 kg   LMP 01/13/2022 (Exact Date) Comment: pt has minimal cycles and occasional light bleeding with her IUD  SpO2 100%   BMI 28.75 kg/m  Physical Exam Vitals and nursing note reviewed.  Constitutional:      General: She is not in acute distress.    Appearance: She is well-developed.  HENT:     Head: Normocephalic and atraumatic.     Right Ear: External ear normal.     Left Ear: External ear normal.     Nose: Nose normal.  Eyes:     Conjunctiva/sclera: Conjunctivae normal.     Pupils: Pupils are equal, round, and reactive to light.  Cardiovascular:     Rate and Rhythm: Normal rate and regular rhythm.  Pulmonary:     Effort: Pulmonary effort is normal.  Abdominal:     General: Bowel sounds are normal.     Palpations: Abdomen is soft.  Musculoskeletal:        General: Normal range of motion.     Cervical back: Normal range of motion and neck supple.  Skin:    General: Skin is warm and dry.  Neurological:     Mental Status: She is alert and oriented to person, place, and time.     Motor: No abnormal muscle tone.     Coordination: Coordination normal.  Psychiatric:        Behavior: Behavior normal.        Thought Content: Thought content normal.     ED Results / Procedures / Treatments   Labs (all labs ordered are listed, but only abnormal results are displayed) Labs Reviewed - No data to display  EKG None  Radiology DG Chest 2 View  Result Date: 02/05/2022 CLINICAL DATA:  Productive cough for 3 weeks. EXAM: CHEST - 2 VIEW COMPARISON:  11/16/2021 FINDINGS: The  heart size and mediastinal contours are within normal limits. There is no evidence of pulmonary edema, consolidation, pneumothorax, nodule or pleural fluid. The visualized skeletal structures are unremarkable. IMPRESSION: No active cardiopulmonary disease. Electronically Signed   By: Aletta Edouard M.D.   On: 02/05/2022 08:43    Procedures Procedures    Medications Ordered in ED Medications  albuterol (VENTOLIN HFA) 108 (90 Base) MCG/ACT inhaler 2 puff (has no administration in time range)  AeroChamber Plus Flo-Vu Medium MISC 1 each (has no administration in time range)    ED Course/ Medical Decision Making/ A&P Clinical Course as of 02/05/22 0916  Tue Feb 05, 2022  0903 Chest x-Frady Taddeo reviewed interpreted no evidence of acute abnormality noted [DR]    Clinical Course User Index [DR] Pattricia Boss, MD                           Medical Decision Making 26 year old female with cough present for several weeks. Differential diagnosis includes but is not limited to bacterial pneumonia, viral infection with prolonged cough, abscess, masses of the lung. Previously healthy 26 year old has a low index of suspicion for masses or abscess. Chest x-Gerald Honea was obtained and no evidence of bacterial pneumonia Patient is hemodynamically stable with normal oxygen saturations. I suspect that she has had a viral infection and just has some prolonged coughing with this.  We discussed antibiotics and will not prescribe at this time.  She is given albuterol inhaler.  She is advised to use this as needed for symptoms. We have discussed return precautions need for follow-up and she is understanding.  Amount and/or Complexity of Data Reviewed Radiology: ordered.  Risk Prescription drug management.           Final Clinical Impression(s) / ED Diagnoses Final diagnoses:  Subacute cough    Rx / DC Orders ED Discharge Orders     None         Pattricia Boss, MD 02/05/22 [phone]

## 2022-02-05 NOTE — Discharge Instructions (Signed)
Please use albuterol inhaler 2 puffs every 4 hours if it helps with coughing. You should be reassessed if your cough continues beyond the next 2 weeks, you began having a fever, or you become short of breath.

## 2022-02-11 ENCOUNTER — Encounter (HOSPITAL_BASED_OUTPATIENT_CLINIC_OR_DEPARTMENT_OTHER): Payer: Self-pay

## 2022-02-11 DIAGNOSIS — J209 Acute bronchitis, unspecified: Secondary | ICD-10-CM | POA: Insufficient documentation

## 2022-02-11 DIAGNOSIS — Z5321 Procedure and treatment not carried out due to patient leaving prior to being seen by health care provider: Secondary | ICD-10-CM | POA: Diagnosis not present

## 2022-02-11 DIAGNOSIS — R1110 Vomiting, unspecified: Secondary | ICD-10-CM | POA: Diagnosis not present

## 2022-02-11 DIAGNOSIS — R059 Cough, unspecified: Secondary | ICD-10-CM | POA: Insufficient documentation

## 2022-02-11 DIAGNOSIS — Z20822 Contact with and (suspected) exposure to covid-19: Secondary | ICD-10-CM | POA: Diagnosis not present

## 2022-02-11 LAB — RESP PANEL BY RT-PCR (FLU A&B, COVID) ARPGX2
Influenza A by PCR: NEGATIVE
Influenza B by PCR: NEGATIVE
SARS Coronavirus 2 by RT PCR: NEGATIVE

## 2022-02-11 NOTE — ED Triage Notes (Addendum)
Pt states she has been dealing with this cough x 2 weeks. Has not been prescribed anything except inhaler (not helping) Now she is coughing so hard she vomits

## 2022-02-12 ENCOUNTER — Emergency Department (HOSPITAL_BASED_OUTPATIENT_CLINIC_OR_DEPARTMENT_OTHER)
Admission: EM | Admit: 2022-02-12 | Discharge: 2022-02-12 | Disposition: A | Discharge status: Home / Self Care | Payer: Medicaid Other | Source: Home / Self Care | Attending: Emergency Medicine | Admitting: Emergency Medicine

## 2022-02-12 ENCOUNTER — Emergency Department (HOSPITAL_BASED_OUTPATIENT_CLINIC_OR_DEPARTMENT_OTHER)
Admission: EM | Admit: 2022-02-12 | Discharge: 2022-02-12 | Discharge status: Against Medical Advice | Payer: Medicaid Other | Attending: Emergency Medicine | Admitting: Emergency Medicine

## 2022-02-12 ENCOUNTER — Encounter (HOSPITAL_BASED_OUTPATIENT_CLINIC_OR_DEPARTMENT_OTHER): Payer: Self-pay

## 2022-02-12 ENCOUNTER — Other Ambulatory Visit: Payer: Self-pay

## 2022-02-12 ENCOUNTER — Emergency Department (HOSPITAL_BASED_OUTPATIENT_CLINIC_OR_DEPARTMENT_OTHER): Payer: Medicaid Other | Admitting: Radiology

## 2022-02-12 DIAGNOSIS — J209 Acute bronchitis, unspecified: Secondary | ICD-10-CM

## 2022-02-12 MED ORDER — GUAIFENESIN-CODEINE 100-10 MG/5ML PO SYRP: 5.0000 mL | ORAL_SOLUTION | Freq: Three times a day (TID) | ORAL | 0 refills | Status: DC | PRN

## 2022-02-12 MED ORDER — DOXYCYCLINE HYCLATE 100 MG PO CAPS: 100.0000 mg | ORAL_CAPSULE | Freq: Two times a day (BID) | ORAL | 0 refills | Status: DC

## 2022-02-12 NOTE — ED Triage Notes (Signed)
Pt returns after LWBS last night. Pt states she has had a cough with a small amount of phlegm.

## 2022-02-12 NOTE — ED Provider Notes (Signed)
[facility] EMERGENCY DEPT Provider Note   CSN: [id number] Arrival date & time: 02/12/22  2229     History  Chief Complaint  Patient presents with   Cough    Cassandra Hill is a 26 y.o. female.  He has no significant past medical history.  Complaining of a cough its been going on for 2 weeks.  Coughing so hard she vomits.  Ribs hurt.  No fevers or chills.  Has been using over-the-counter medications without any improvement.  Was here a few weeks ago chest x-ray was negative given albuterol.  Was here last evening and had a negative COVID and flu swab but the wait was too long so she went home.  She said she has been unable to reach her primary care doctor.  Non-smoker  The history is provided by the patient.  Cough Cough characteristics:  Non-productive Sputum characteristics:  Nondescript Severity:  Severe Onset quality:  Gradual Duration:  2 weeks Timing:  Constant Progression:  Unchanged Chronicity:  New Smoker: no   Relieved by:  Nothing Worsened by:  Nothing Ineffective treatments:  Cough suppressants Associated symptoms: chest pain and sore throat   Associated symptoms: no chills, no ear pain, no fever and no rash        Home Medications Prior to Admission medications   Medication Sig Start Date End Date Taking? Authorizing Provider  acetaminophen (TYLENOL) 500 MG tablet Take 1,000 mg by mouth every 6 (six) hours as needed. With Rizatriptan    [provider]  buPROPion (WELLBUTRIN XL) 150 MG 24 hr tablet Take 150 mg by mouth daily.    [provider]  dicyclomine (BENTYL) 20 MG tablet Take 1 tablet (20 mg total) by mouth 2 (two) times daily. 01/14/22   Renne Crigler, PA-C  hydrOXYzine (ATARAX/VISTARIL) 10 MG tablet Take 10 mg by mouth 3 (three) times daily as needed.    [provider]  ibuprofen (ADVIL) 600 MG tablet Take 1 tablet (600 mg total) by mouth every 6 (six) hours as needed. 01/07/22   Lorre Nick, MD   loperamide (IMODIUM) 2 MG capsule Take 1 capsule (2 mg total) by mouth 4 (four) times daily as needed for diarrhea or loose stools. 01/14/22   Renne Crigler, PA-C  methocarbamol (ROBAXIN) 500 MG tablet Take 1 tablet (500 mg total) by mouth 2 (two) times daily. 01/07/22   Lorre Nick, MD  methylphenidate 18 MG PO CR tablet Take 18 mg by mouth daily.    [provider]  omeprazole (PRILOSEC) 40 MG capsule Take 40 mg by mouth daily. 12/05/21   [provider]  ondansetron (ZOFRAN-ODT) 4 MG disintegrating tablet Take 1 tablet (4 mg total) by mouth every 8 (eight) hours as needed for nausea or vomiting. 01/14/22   Renne Crigler, PA-C  rizatriptan (MAXALT) 10 MG tablet Take 10 mg by mouth as needed for migraine. May repeat in 2 hours if needed    [provider]      Allergies    Patient has no known allergies.    Review of Systems   Review of Systems  Constitutional:  Negative for chills and fever.  HENT:  Positive for sore throat. Negative for ear pain.   Eyes:  Negative for visual disturbance.  Respiratory:  Positive for cough.   Cardiovascular:  Positive for chest pain.  Gastrointestinal:  Positive for vomiting.  Skin:  Negative for rash.    Physical Exam Updated Vital Signs BP 124/67 (BP Location: Right Arm)  Pulse 73   Temp 98 F (36.7 C) (Oral)   Resp 16   Ht 5\' 2"  (1.575 m)   Wt 71.7 kg   LMP 01/13/2022 (Exact Date) Comment: pt has minimal cycles and occasional light bleeding with her IUD  SpO2 96%   BMI 28.90 kg/m  Physical Exam Vitals and nursing note reviewed.  Constitutional:      General: She is not in acute distress.    Appearance: Normal appearance. She is well-developed.  HENT:     Head: Normocephalic and atraumatic.     Right Ear: Tympanic membrane normal.     Left Ear: Tympanic membrane normal.     Nose: Nose normal.     Mouth/Throat:     Mouth: Mucous membranes are moist.     Pharynx: Oropharynx is clear.  Eyes:      Conjunctiva/sclera: Conjunctivae normal.  Cardiovascular:     Rate and Rhythm: Normal rate and regular rhythm.     Heart sounds: No murmur heard. Pulmonary:     Effort: Pulmonary effort is normal. No respiratory distress.     Breath sounds: Normal breath sounds.  Abdominal:     Palpations: Abdomen is soft.     Tenderness: There is no abdominal tenderness.  Musculoskeletal:     Cervical back: Neck supple.     Right lower leg: No edema.     Left lower leg: No edema.  Skin:    General: Skin is warm and dry.     Capillary Refill: Capillary refill takes less than 2 seconds.  Neurological:     General: No focal deficit present.     Mental Status: She is alert.     Gait: Gait normal.     ED Results / Procedures / Treatments   Labs (all labs ordered are listed, but only abnormal results are displayed) Labs Reviewed - No data to display  EKG None  Radiology DG Chest 2 View  Result Date: 02/12/2022 CLINICAL DATA:  26 year old female with history of cough. EXAM: CHEST - 2 VIEW COMPARISON:  Chest x-ray 02/05/2022. FINDINGS: Lung volumes are normal. No consolidative airspace disease. No pleural effusions. No pneumothorax. No pulmonary nodule or mass noted. Pulmonary vasculature and the cardiomediastinal silhouette are within normal limits. IMPRESSION: No radiographic evidence of acute cardiopulmonary disease. Electronically Signed   By: 02/07/2022 M.D.   On: 02/12/2022 07:30    Procedures Procedures    Medications Ordered in ED Medications - No data to display  ED Course/ Medical Decision Making/ A&P Clinical Course as of 02/12/22 0736  Tue Feb 12, 2022  0729 Chest x-ray interpreted by me as no acute infiltrates no pneumothorax.  Awaiting radiology reading. [MB]    Clinical Course User Index [MB] 0730, MD                           Medical Decision Making Amount and/or Complexity of Data Reviewed Radiology: ordered.  Risk OTC drugs. Prescription drug  management.  [name] was evaluated in Emergency Department on 02/12/2022 for the symptoms described in the history of present illness. She was evaluated in the context of the global COVID-19 pandemic, which necessitated consideration that the patient might be at risk for infection with the SARS-CoV-2 virus that causes COVID-19. Institutional protocols and algorithms that pertain to the evaluation of patients at risk for COVID-19 are in a state of rapid change based on information released by regulatory bodies  including the CDC and federal and state organizations. These policies and algorithms were followed during the patient's care in the ED. 27 year old female here with cough.  Differential includes COVID, flu, bronchitis, pneumonia, reactive airway disease, aspiration, reflux.  Patient was COVID and flu tested last evening and negative.  She is already on an inhaler.  Will cover with antibiotics and prescription cough medicine for symptomatic treatment.  Recommended follow-up PCP.  Return instructions discussed         Final Clinical Impression(s) / ED Diagnoses Final diagnoses:  Acute bronchitis, unspecified organism    Rx / DC Orders ED Discharge Orders     None         Hayden Rasmussen, MD 02/12/22 [phone]

## 2022-02-12 NOTE — ED Notes (Signed)
Per Registration, Patient LWBS from Waiting Area. 

## 2022-03-09 ENCOUNTER — Encounter (HOSPITAL_BASED_OUTPATIENT_CLINIC_OR_DEPARTMENT_OTHER): Payer: Self-pay | Admitting: Radiology

## 2022-03-09 ENCOUNTER — Emergency Department (HOSPITAL_BASED_OUTPATIENT_CLINIC_OR_DEPARTMENT_OTHER): Payer: Medicaid Other

## 2022-03-09 ENCOUNTER — Emergency Department (HOSPITAL_BASED_OUTPATIENT_CLINIC_OR_DEPARTMENT_OTHER)
Admission: EM | Admit: 2022-03-09 | Discharge: 2022-03-09 | Disposition: A | Discharge status: Home / Self Care | Payer: Medicaid Other | Attending: Emergency Medicine | Admitting: Emergency Medicine

## 2022-03-09 ENCOUNTER — Other Ambulatory Visit: Payer: Self-pay

## 2022-03-09 DIAGNOSIS — R112 Nausea with vomiting, unspecified: Secondary | ICD-10-CM | POA: Diagnosis not present

## 2022-03-09 DIAGNOSIS — R1011 Right upper quadrant pain: Secondary | ICD-10-CM | POA: Insufficient documentation

## 2022-03-09 DIAGNOSIS — R1013 Epigastric pain: Secondary | ICD-10-CM | POA: Diagnosis not present

## 2022-03-09 DIAGNOSIS — D72829 Elevated white blood cell count, unspecified: Secondary | ICD-10-CM | POA: Diagnosis not present

## 2022-03-09 DIAGNOSIS — K529 Noninfective gastroenteritis and colitis, unspecified: Secondary | ICD-10-CM

## 2022-03-09 LAB — COMPREHENSIVE METABOLIC PANEL
ALT: 11 U/L (ref 0–44)
AST: 10 U/L — ABNORMAL LOW (ref 15–41)
Albumin: 4.1 g/dL (ref 3.5–5.0)
Alkaline Phosphatase: 66 U/L (ref 38–126)
Anion gap: 6 (ref 5–15)
BUN: 14 mg/dL (ref 6–20)
CO2: 27 mmol/L (ref 22–32)
Calcium: 9.1 mg/dL (ref 8.9–10.3)
Chloride: 103 mmol/L (ref 98–111)
Creatinine, Ser: 0.82 mg/dL (ref 0.44–1.00)
GFR, Estimated: >60 mL/min (ref >60)
Glucose, Bld: 97 mg/dL (ref 70–99)
Potassium: 3.9 mmol/L (ref 3.5–5.1)
Sodium: 136 mmol/L (ref 135–145)
Total Bilirubin: 0.4 mg/dL (ref 0.3–1.2)
Total Protein: 6.7 g/dL (ref 6.5–8.1)

## 2022-03-09 LAB — URINALYSIS, ROUTINE W REFLEX MICROSCOPIC
Bilirubin Urine: NEGATIVE
Glucose, UA: NEGATIVE mg/dL
Hgb urine dipstick: NEGATIVE
Ketones, ur: NEGATIVE mg/dL
Nitrite: NEGATIVE
Protein, ur: NEGATIVE mg/dL
Specific Gravity, Urine: 1.02 (ref 1.005–1.030)
pH: 7.5 (ref 5.0–8.0)

## 2022-03-09 LAB — CBC
HCT: 35.3 % — ABNORMAL LOW (ref 36.0–46.0)
Hemoglobin: 11.6 g/dL — ABNORMAL LOW (ref 12.0–15.0)
MCH: 27.3 pg (ref 26.0–34.0)
MCHC: 32.9 g/dL (ref 30.0–36.0)
MCV: 83.1 fL (ref 80.0–100.0)
Platelets: 376 10*3/uL (ref 150–400)
RBC: 4.25 MIL/uL (ref 3.87–5.11)
RDW: 12.5 % (ref 11.5–15.5)
WBC: 11.7 10*3/uL — ABNORMAL HIGH (ref 4.0–10.5)
nRBC: 0 % (ref 0.0–0.2)

## 2022-03-09 LAB — LIPASE, BLOOD: Lipase: 22 U/L (ref 11–51)

## 2022-03-09 LAB — PREGNANCY, URINE: Preg Test, Ur: NEGATIVE

## 2022-03-09 MED ORDER — ONDANSETRON 8 MG PO TBDP: ORAL_TABLET | ORAL | 0 refills | Status: DC

## 2022-03-09 MED ORDER — CEPHALEXIN 500 MG PO CAPS: 500.0000 mg | ORAL_CAPSULE | Freq: Three times a day (TID) | ORAL | 0 refills | Status: DC

## 2022-03-09 MED ORDER — KETOROLAC TROMETHAMINE 30 MG/ML IJ SOLN
30.0000 mg | Freq: Once | INTRAMUSCULAR | Status: AC
  Administered 2022-03-09: 30 mg via INTRAVENOUS
  Filled 2022-03-09: qty 1

## 2022-03-09 MED ORDER — ONDANSETRON 4 MG PO TBDP: 4.0000 mg | ORAL_TABLET | Freq: Once | ORAL | Status: DC | PRN

## 2022-03-09 MED ORDER — IOHEXOL 300 MG/ML  SOLN
100.0000 mL | Freq: Once | INTRAMUSCULAR | Status: AC | PRN
  Administered 2022-03-09: 80 mL via INTRAVENOUS

## 2022-03-09 MED ORDER — CEPHALEXIN 250 MG PO CAPS
500.0000 mg | ORAL_CAPSULE | Freq: Once | ORAL | Status: AC
  Administered 2022-03-09: 500 mg via ORAL
  Filled 2022-03-09: qty 2

## 2022-03-09 MED ORDER — ONDANSETRON HCL 4 MG/2ML IJ SOLN
4.0000 mg | Freq: Once | INTRAMUSCULAR | Status: AC
  Administered 2022-03-09: 4 mg via INTRAVENOUS
  Filled 2022-03-09: qty 2

## 2022-03-09 MED ORDER — SODIUM CHLORIDE 0.9 % IV BOLUS
1000.0000 mL | Freq: Once | INTRAVENOUS | Status: AC
  Administered 2022-03-09: 1000 mL via INTRAVENOUS

## 2022-03-09 NOTE — ED Notes (Signed)
Patient's bracelet wouldn't scan for med and fluid administration

## 2022-03-09 NOTE — ED Provider Notes (Signed)
[facility] EMERGENCY DEPT Provider Note   CSN: [id number] Arrival date & time: 03/09/22  0102     History  Chief Complaint  Patient presents with   Abdominal Pain    Cassandra Hill is a 26 y.o. female.  Patient is a 26 year old female with past medical history of GERD and migraines.  Patient presenting today with complaints of nausea, vomiting, and abdominal pain.  She reports a 3-week history of pain to the right side of her abdomen that has been intermittent.  She saw her doctor for this who thinks it may be her gallbladder.  She denies any fevers, chills, or relation to food.  This evening she ate seafood, then became nauseated and vomited approximately 1 hour later.  She had several episodes this evening and presents for evaluation of both her abdominal pain and nausea/vomiting.  She denies any diarrhea or bloody stools.  The history is provided by the patient.       Home Medications Prior to Admission medications   Medication Sig Start Date End Date Taking? Authorizing Provider  acetaminophen (TYLENOL) 500 MG tablet Take 1,000 mg by mouth every 6 (six) hours as needed. With Rizatriptan    [provider]  buPROPion (WELLBUTRIN XL) 150 MG 24 hr tablet Take 150 mg by mouth daily.    [provider]  dicyclomine (BENTYL) 20 MG tablet Take 1 tablet (20 mg total) by mouth 2 (two) times daily. 01/14/22   Renne Crigler, PA-C  doxycycline (VIBRAMYCIN) 100 MG capsule Take 1 capsule (100 mg total) by mouth 2 (two) times daily. 02/12/22   Terrilee Files, MD  guaiFENesin-codeine [facility]) 100-10 MG/5ML syrup Take 5 mLs by mouth 3 (three) times daily as needed for cough. 02/12/22   Terrilee Files, MD  hydrOXYzine (ATARAX/VISTARIL) 10 MG tablet Take 10 mg by mouth 3 (three) times daily as needed.    [provider]  ibuprofen (ADVIL) 600 MG tablet Take 1 tablet (600 mg total) by mouth every 6 (six) hours as needed. 01/07/22   Lorre Nick, MD  loperamide (IMODIUM) 2 MG capsule Take 1 capsule (2 mg total) by mouth 4 (four) times daily as needed for diarrhea or loose stools. 01/14/22   Renne Crigler, PA-C  methocarbamol (ROBAXIN) 500 MG tablet Take 1 tablet (500 mg total) by mouth 2 (two) times daily. 01/07/22   Lorre Nick, MD  methylphenidate 18 MG PO CR tablet Take 18 mg by mouth daily.    [provider]  omeprazole (PRILOSEC) 40 MG capsule Take 40 mg by mouth daily. 12/05/21   [provider]  ondansetron (ZOFRAN-ODT) 4 MG disintegrating tablet Take 1 tablet (4 mg total) by mouth every 8 (eight) hours as needed for nausea or vomiting. 01/14/22   Renne Crigler, PA-C  rizatriptan (MAXALT) 10 MG tablet Take 10 mg by mouth as needed for migraine. May repeat in 2 hours if needed    [provider]      Allergies    Patient has no known allergies.    Review of Systems   Review of Systems  All other systems reviewed and are negative.   Physical Exam Updated Vital Signs BP 117/70   Pulse (!) 57   Temp (!) 97.1 F (36.2 C) (Oral)   Resp 18   Wt 72.6 kg   LMP 02/12/2022 (Exact Date)   SpO2 99%   BMI 29.26 kg/m  Physical Exam Vitals and nursing note reviewed.  Constitutional:  General: She is not in acute distress.    Appearance: She is well-developed. She is not diaphoretic.  HENT:     Head: Normocephalic and atraumatic.  Cardiovascular:     Rate and Rhythm: Normal rate and regular rhythm.     Heart sounds: No murmur heard.    No friction rub. No gallop.  Pulmonary:     Effort: Pulmonary effort is normal. No respiratory distress.     Breath sounds: Normal breath sounds. No wheezing.  Abdominal:     General: Bowel sounds are normal. There is no distension.     Palpations: Abdomen is soft.     Tenderness: There is abdominal tenderness in the right upper quadrant and epigastric area. There is no right CVA tenderness, left CVA tenderness, guarding or rebound.  Musculoskeletal:         General: Normal range of motion.     Cervical back: Normal range of motion and neck supple.  Skin:    General: Skin is warm and dry.  Neurological:     General: No focal deficit present.     Mental Status: She is alert and oriented to person, place, and time.     ED Results / Procedures / Treatments   Labs (all labs ordered are listed, but only abnormal results are displayed) Labs Reviewed  COMPREHENSIVE METABOLIC PANEL - Abnormal; Notable for the following components:      Result Value   AST 10 (*)    All other components within normal limits  CBC - Abnormal; Notable for the following components:   WBC 11.7 (*)    Hemoglobin 11.6 (*)    HCT 35.3 (*)    All other components within normal limits  URINALYSIS, ROUTINE W REFLEX MICROSCOPIC - Abnormal; Notable for the following components:   Leukocytes,Ua LARGE (*)    All other components within normal limits  LIPASE, BLOOD  PREGNANCY, URINE    EKG None  Radiology No results found.  Procedures Procedures    Medications Ordered in ED Medications  ketorolac (TORADOL) 30 MG/ML injection 30 mg (has no administration in time range)  ondansetron (ZOFRAN) injection 4 mg (has no administration in time range)  sodium chloride 0.9 % bolus 1,000 mL (has no administration in time range)    ED Course/ Medical Decision Making/ A&P  Patient is a 26 year old female presenting with complaints of nausea and vomiting as described in the HPI.  She also describes a 3-week history of pain to the right side of the abdomen.  This evening she began vomiting after eating seafood.  Patient arrives here with stable vital signs and is afebrile.  She has mild tenderness across the upper abdomen, but no peritoneal signs.  Physical examination is otherwise unremarkable.  Work-up initiated including CBC, CMP, and lipase.  All of these are essentially unremarkable, but does have a white count of 11.7.  For this reason, patient sent for a CT scan  of the abdomen and pelvis to rule out appendicitis.  This showed a normal appendix.  She did have several tiny gallstones, but nothing that appeared to be obstructive or the cause of her symptoms.  She does have a urinalysis consistent with a UTI.  She will be given Keflex for this.  I suspect patient's symptoms are either foodborne or viral in nature.  She seems to be feeling better after receiving IV fluids, Zofran, and Toradol.  Patient to follow-up with primary doctor if not improving and is to return as needed  if symptoms worsen.  Final Clinical Impression(s) / ED Diagnoses Final diagnoses:  None    Rx / DC Orders ED Discharge Orders     None         Veryl Speak, MD 03/09/22 [phone]

## 2022-03-09 NOTE — Discharge Instructions (Signed)
Begin taking Keflex as prescribed.  Begin taking Zofran as prescribed as needed for nausea.  Clear liquids for the next 12 hours, then slowly advance to normal as tolerated.  Return to the emergency department if you develop severe abdominal pain, bloody stools, high fevers, or other new and concerning symptoms.

## 2022-03-09 NOTE — ED Provider Notes (Signed)
Prescriptions sent to alternate pharmacy.   Dorie Rank, MD 03/09/22 [phone]

## 2022-03-09 NOTE — ED Triage Notes (Addendum)
Pt presents for N/V after eating seafood today. She would also like to be treated for RLQ pain that has been present for 3 weeks. Endorses loose stools.  Denies fevers, chills, blood in stool, urinary symptoms LMP 10/3 approx.  Tried zofran at home without relief  H/o gallstones, migraines, heart burn

## 2022-04-21 ENCOUNTER — Emergency Department (HOSPITAL_BASED_OUTPATIENT_CLINIC_OR_DEPARTMENT_OTHER)
Admission: EM | Admit: 2022-04-21 | Discharge: 2022-04-21 | Disposition: A | Discharge status: Home / Self Care | Payer: Medicaid Other | Attending: Emergency Medicine | Admitting: Emergency Medicine

## 2022-04-21 ENCOUNTER — Encounter (HOSPITAL_BASED_OUTPATIENT_CLINIC_OR_DEPARTMENT_OTHER): Payer: Self-pay

## 2022-04-21 ENCOUNTER — Other Ambulatory Visit: Payer: Self-pay

## 2022-04-21 DIAGNOSIS — D72829 Elevated white blood cell count, unspecified: Secondary | ICD-10-CM | POA: Diagnosis not present

## 2022-04-21 DIAGNOSIS — K8050 Calculus of bile duct without cholangitis or cholecystitis without obstruction: Secondary | ICD-10-CM

## 2022-04-21 DIAGNOSIS — R1011 Right upper quadrant pain: Secondary | ICD-10-CM | POA: Diagnosis present

## 2022-04-21 LAB — CBC
HCT: 37.5 % (ref 36.0–46.0)
Hemoglobin: 12.5 g/dL (ref 12.0–15.0)
MCH: 27.5 pg (ref 26.0–34.0)
MCHC: 33.3 g/dL (ref 30.0–36.0)
MCV: 82.6 fL (ref 80.0–100.0)
Platelets: 389 10*3/uL (ref 150–400)
RBC: 4.54 MIL/uL (ref 3.87–5.11)
RDW: 12.2 % (ref 11.5–15.5)
WBC: 12.6 10*3/uL — ABNORMAL HIGH (ref 4.0–10.5)
nRBC: 0 % (ref 0.0–0.2)

## 2022-04-21 LAB — COMPREHENSIVE METABOLIC PANEL
ALT: 11 U/L (ref 0–44)
AST: 9 U/L — ABNORMAL LOW (ref 15–41)
Albumin: 4.3 g/dL (ref 3.5–5.0)
Alkaline Phosphatase: 62 U/L (ref 38–126)
Anion gap: 7 (ref 5–15)
BUN: 14 mg/dL (ref 6–20)
CO2: 29 mmol/L (ref 22–32)
Calcium: 9.4 mg/dL (ref 8.9–10.3)
Chloride: 103 mmol/L (ref 98–111)
Creatinine, Ser: 0.59 mg/dL (ref 0.44–1.00)
GFR, Estimated: >60 mL/min (ref >60)
Glucose, Bld: 91 mg/dL (ref 70–99)
Potassium: 3.8 mmol/L (ref 3.5–5.1)
Sodium: 139 mmol/L (ref 135–145)
Total Bilirubin: 0.4 mg/dL (ref 0.3–1.2)
Total Protein: 6.9 g/dL (ref 6.5–8.1)

## 2022-04-21 LAB — LIPASE, BLOOD: Lipase: 18 U/L (ref 11–51)

## 2022-04-21 MED ORDER — ONDANSETRON 8 MG PO TBDP: 8.0000 mg | ORAL_TABLET | Freq: Three times a day (TID) | ORAL | 0 refills | Status: DC | PRN

## 2022-04-21 MED ORDER — HYDROCODONE-ACETAMINOPHEN 5-325 MG PO TABS: 1.0000 | ORAL_TABLET | Freq: Four times a day (QID) | ORAL | 0 refills | Status: DC | PRN

## 2022-04-21 NOTE — ED Triage Notes (Signed)
Pt presents with c/o right flank pain. Pt reports she has been diagnosed with "gall stones" but has been unable to follow-up with PCP

## 2022-04-21 NOTE — ED Provider Notes (Signed)
[facility] EMERGENCY Provider Note: Cassandra Dell, MD, FACEP  CSN: [id number] MRN: [id number] ARRIVAL: 04/21/22 at 0003 ROOM: DB016/DB016   CHIEF COMPLAINT  Abdominal Pain   HISTORY OF PRESENT ILLNESS  04/21/22 1:41 AM Cassandra Hill is a 26 y.o. female with a history of cholelithiasis who has been having right upper quadrant pain off and on for the past several months.  She has had both ultrasound and CT confirmation of cholelithiasis.  Her frequency of right upper quadrant pain attacks have increased over the past 2 weeks.  She is usually pain-free when sitting but moving around exacerbates her pain.  She has not noticed that eating particularly exaggerates the pain.  She is here tonight not because the pain has acutely worsened but out of frustration at wanting to have the problem definitively solved.  She has never been prescribed pain medicine for this and she states she has never been referred to a Careers adviser.  She is not having nausea, vomiting or diarrhea with this.  She is not having a fever currently.  She rated her pain as a 9 out of 10 in triage but states her pain is not as severe as some of her worst episodes.   Past Medical History:  Diagnosis Date   Arthritis    Back pain    IBS (irritable bowel syndrome)    Migraine     Past Surgical History:  Procedure Laterality Date   INDUCED ABORTION     tubes in ears      Family History  Problem Relation Age of Onset   Diabetes Mother    Hypertension Mother    Hypertension Father     Social History   Tobacco Use   Smoking status: Never   Smokeless tobacco: Never  Vaping Use   Vaping Use: Former  Substance Use Topics   Alcohol use: Yes    Comment: "Every once a while"    Drug use: Not Currently    Types: Marijuana    Comment: February 2020    Prior to Admission medications   Medication Sig Start Date End Date Taking? Authorizing Provider  HYDROcodone-acetaminophen (NORCO) 5-325 MG tablet Take 1-2 tablets  by mouth every 6 (six) hours as needed for severe pain. 04/21/22  Yes Vincenzina Jagoda, MD  ondansetron (ZOFRAN-ODT) 8 MG disintegrating tablet Take 1 tablet (8 mg total) by mouth every 8 (eight) hours as needed for nausea or vomiting. 04/21/22  Yes Jilleen Essner, MD  buPROPion (WELLBUTRIN XL) 150 MG 24 hr tablet Take 150 mg by mouth daily.    [provider]  dicyclomine (BENTYL) 20 MG tablet Take 1 tablet (20 mg total) by mouth 2 (two) times daily. 01/14/22   Renne Crigler, PA-C  guaiFENesin-codeine (ROBITUSSIN AC) 100-10 MG/5ML syrup Take 5 mLs by mouth 3 (three) times daily as needed for cough. 02/12/22   Terrilee Files, MD  hydrOXYzine (ATARAX/VISTARIL) 10 MG tablet Take 10 mg by mouth 3 (three) times daily as needed.    [provider]  ibuprofen (ADVIL) 600 MG tablet Take 1 tablet (600 mg total) by mouth every 6 (six) hours as needed. 01/07/22   Lorre Nick, MD  loperamide (IMODIUM) 2 MG capsule Take 1 capsule (2 mg total) by mouth 4 (four) times daily as needed for diarrhea or loose stools. 01/14/22   Renne Crigler, PA-C  methocarbamol (ROBAXIN) 500 MG tablet Take 1 tablet (500 mg total) by mouth 2 (two) times daily. 01/07/22   Lorre Nick, MD  methylphenidate 18 MG PO CR tablet Take 18 mg by mouth daily.    [provider]  omeprazole (PRILOSEC) 40 MG capsule Take 40 mg by mouth daily. 12/05/21   [provider]  rizatriptan (MAXALT) 10 MG tablet Take 10 mg by mouth as needed for migraine. May repeat in 2 hours if needed    [provider]    Allergies Patient has no known allergies.   REVIEW OF SYSTEMS  Negative except as noted here or in the History of Present Illness.   PHYSICAL EXAMINATION  Initial Vital Signs Blood pressure 131/75, pulse 80, temperature 97.7 F (36.5 C), temperature source Tympanic, resp. rate 16, height 5\' 2"  (1.575 m), weight 68 kg, last menstrual period 04/12/2022, SpO2 100 %.  Examination General:  Well-developed, well-nourished female in no acute distress; appearance consistent with age of record HENT: normocephalic; atraumatic Eyes: Normal appearance Neck: supple Heart: regular rate and rhythm Lungs: clear to auscultation bilaterally Abdomen: soft; nondistended; right upper quadrant tenderness; bowel sounds present Extremities: No deformity; full range of motion; pulses normal Neurologic: Awake, alert and oriented; motor function intact in all extremities and symmetric; no facial droop Skin: Warm and dry Psychiatric: Normal mood and affect   RESULTS  Summary of this visit's results, reviewed and interpreted by myself:   EKG Interpretation  Date/Time:    Ventricular Rate:    PR Interval:    QRS Duration:   QT Interval:    QTC Calculation:   R Axis:     Text Interpretation:         Laboratory Studies: Results for orders placed or performed during the hospital encounter of 04/21/22 (from the past 24 hour(s))  Lipase, blood     Status: None   Collection Time: 04/21/22 12:25 AM  Result Value Ref Range   Lipase 18 11 - 51 U/L  Comprehensive metabolic panel     Status: Abnormal   Collection Time: 04/21/22 12:25 AM  Result Value Ref Range   Sodium 139 135 - 145 mmol/L   Potassium 3.8 3.5 - 5.1 mmol/L   Chloride 103 98 - 111 mmol/L   CO2 29 22 - 32 mmol/L   Glucose, Bld 91 70 - 99 mg/dL   BUN 14 6 - 20 mg/dL   Creatinine, Ser 14/10/23 0.44 - 1.00 mg/dL   Calcium 9.4 8.9 - 9.38 mg/dL   Total Protein 6.9 6.5 - 8.1 g/dL   Albumin 4.3 3.5 - 5.0 g/dL   AST 9 (L) 15 - 41 U/L   ALT 11 0 - 44 U/L   Alkaline Phosphatase 62 38 - 126 U/L   Total Bilirubin 0.4 0.3 - 1.2 mg/dL   GFR, Estimated 10.1 >75 mL/min   Anion gap 7 5 - 15  CBC     Status: Abnormal   Collection Time: 04/21/22 12:25 AM  Result Value Ref Range   WBC 12.6 (H) 4.0 - 10.5 K/uL   RBC 4.54 3.87 - 5.11 MIL/uL   Hemoglobin 12.5 12.0 - 15.0 g/dL   HCT 14/10/23 25.8 - 52.7 %   MCV 82.6 80.0 - 100.0 fL   MCH 27.5  26.0 - 34.0 pg   MCHC 33.3 30.0 - 36.0 g/dL   RDW 78.2 42.3 - 53.6 %   Platelets 389 150 - 400 K/uL   nRBC 0.0 0.0 - 0.2 %   Imaging Studies: No results found.  ED COURSE and MDM  Nursing notes, initial and subsequent vitals signs, including pulse oximetry, reviewed  and interpreted by myself.  Vitals:   04/21/22 0010 04/21/22 0011  BP: 131/75   Pulse: 80   Resp: 16   Temp: 97.7 F (36.5 C)   TempSrc: Tympanic   SpO2: 100%   Weight:  68 kg  Height:  5\' 2"  (1.575 m)   Medications - No data to display  The patient's laboratory studies are essentially within normal limits except for a mild leukocytosis.  It is noted that she has had a leukocytosis on every CBC in the past year with 1 exception.  As noted above her pain has not acutely worsened, she is not febrile, and she is here primarily out of frustration.  I do not believe additional imaging is warranted as I do not believe clinically she has acute cholecystitis.  Her symptoms are consistent with biliary colic.  We will treat her with analgesics and refer to [facility] for definitive treatment.  PROCEDURES  Procedures   ED DIAGNOSES     ICD-10-CM   1. Biliary colic  K80.50          Pragya Lofaso, MD 04/21/22 0200

## 2022-05-28 ENCOUNTER — Emergency Department (HOSPITAL_BASED_OUTPATIENT_CLINIC_OR_DEPARTMENT_OTHER)
Admission: EM | Admit: 2022-05-28 | Discharge: 2022-05-28 | Disposition: A | Discharge status: Home / Self Care | Payer: Medicaid Other | Attending: Emergency Medicine | Admitting: Emergency Medicine

## 2022-05-28 ENCOUNTER — Emergency Department (HOSPITAL_BASED_OUTPATIENT_CLINIC_OR_DEPARTMENT_OTHER): Payer: Medicaid Other

## 2022-05-28 ENCOUNTER — Encounter (HOSPITAL_BASED_OUTPATIENT_CLINIC_OR_DEPARTMENT_OTHER): Payer: Self-pay

## 2022-05-28 ENCOUNTER — Other Ambulatory Visit: Payer: Self-pay

## 2022-05-28 DIAGNOSIS — N3000 Acute cystitis without hematuria: Secondary | ICD-10-CM | POA: Insufficient documentation

## 2022-05-28 DIAGNOSIS — R109 Unspecified abdominal pain: Secondary | ICD-10-CM | POA: Diagnosis present

## 2022-05-28 LAB — PREGNANCY, URINE: Preg Test, Ur: NEGATIVE

## 2022-05-28 LAB — URINALYSIS, ROUTINE W REFLEX MICROSCOPIC
Bilirubin Urine: NEGATIVE
Glucose, UA: NEGATIVE mg/dL
Hgb urine dipstick: NEGATIVE
Ketones, ur: NEGATIVE mg/dL
Nitrite: NEGATIVE
Protein, ur: NEGATIVE mg/dL
Specific Gravity, Urine: 1.019 (ref 1.005–1.030)
pH: 8 (ref 5.0–8.0)

## 2022-05-28 LAB — COMPREHENSIVE METABOLIC PANEL
ALT: 22 U/L (ref 0–44)
AST: 16 U/L (ref 15–41)
Albumin: 4.3 g/dL (ref 3.5–5.0)
Alkaline Phosphatase: 64 U/L (ref 38–126)
Anion gap: 5 (ref 5–15)
BUN: 9 mg/dL (ref 6–20)
CO2: 29 mmol/L (ref 22–32)
Calcium: 9.1 mg/dL (ref 8.9–10.3)
Chloride: 103 mmol/L (ref 98–111)
Creatinine, Ser: 0.54 mg/dL (ref 0.44–1.00)
GFR, Estimated: >60 mL/min (ref >60)
Glucose, Bld: 100 mg/dL — ABNORMAL HIGH (ref 70–99)
Potassium: 4.1 mmol/L (ref 3.5–5.1)
Sodium: 137 mmol/L (ref 135–145)
Total Bilirubin: 0.5 mg/dL (ref 0.3–1.2)
Total Protein: 6.8 g/dL (ref 6.5–8.1)

## 2022-05-28 LAB — CBC WITH DIFFERENTIAL/PLATELET
Abs Immature Granulocytes: 0.02 10*3/uL (ref 0.00–0.07)
Basophils Absolute: 0.1 10*3/uL (ref 0.0–0.1)
Basophils Relative: 1 %
Eosinophils Absolute: 0.3 10*3/uL (ref 0.0–0.5)
Eosinophils Relative: 4 %
HCT: 36.8 % (ref 36.0–46.0)
Hemoglobin: 12.5 g/dL (ref 12.0–15.0)
Immature Granulocytes: 0 %
Lymphocytes Relative: 24 %
Lymphs Abs: 2.1 10*3/uL (ref 0.7–4.0)
MCH: 28 pg (ref 26.0–34.0)
MCHC: 34 g/dL (ref 30.0–36.0)
MCV: 82.3 fL (ref 80.0–100.0)
Monocytes Absolute: 0.7 10*3/uL (ref 0.1–1.0)
Monocytes Relative: 8 %
Neutro Abs: 5.7 10*3/uL (ref 1.7–7.7)
Neutrophils Relative %: 63 %
Platelets: 400 10*3/uL (ref 150–400)
RBC: 4.47 MIL/uL (ref 3.87–5.11)
RDW: 12.2 % (ref 11.5–15.5)
WBC: 8.8 10*3/uL (ref 4.0–10.5)
nRBC: 0 % (ref 0.0–0.2)

## 2022-05-28 LAB — LIPASE, BLOOD: Lipase: 14 U/L (ref 11–51)

## 2022-05-28 MED ORDER — CEPHALEXIN 500 MG PO CAPS: 500.0000 mg | ORAL_CAPSULE | Freq: Four times a day (QID) | ORAL | 0 refills | Status: DC

## 2022-05-28 NOTE — Discharge Instructions (Addendum)
It was a pleasure taking care of you today.  As discussed, your CT scan did not show evidence of a kidney stone or kidney infection.  Your urine showed evidence of a possible infection.  I am sending you home with antibiotics.  Take as prescribed and finish all antibiotics.  Your CT scan did show gallstones.  Please follow-up with general surgery for further evaluation.  All of your labs are reassuring.  Return to the ER for new or worsening symptoms.

## 2022-05-28 NOTE — ED Notes (Signed)
Discharge paperwork given and verbally understood. 

## 2022-05-28 NOTE — ED Provider Notes (Signed)
[facility] EMERGENCY DEPT Provider Note   CSN: [id number] Arrival date & time: 05/28/22  1443     History  Chief Complaint  Patient presents with   Flank Pain    Cassandra Hill is a 27 y.o. female with a past medical history significant for IBS and migraines who presents to the ED due to right flank pain x 2 weeks.  Flank pain associated with dysuria and urinary frequency.  Previous history of UTIs.  No fever or chills.  Denies nausea, vomiting, or diarrhea.  No vaginal symptoms.  Patient has a history of biliary colic and is awaiting a general surgery appointment for possible cholecystectomy however, notes this pain feels different than her biliary colic.  No flank injury.  Denies chest pain or shortness of breath.  No history of blood clots.  Denies lower extremity edema.  No rash to flank region. No history of kidney stones.  History obtained from patient and past medical records. No interpreter used during encounter.       Home Medications Prior to Admission medications   Medication Sig Start Date End Date Taking? Authorizing Provider  cephALEXin (KEFLEX) 500 MG capsule Take 1 capsule (500 mg total) by mouth 4 (four) times daily. 05/28/22  Yes Ociel Retherford, Druscilla Brownie, PA-C  buPROPion (WELLBUTRIN XL) 150 MG 24 hr tablet Take 150 mg by mouth daily.    [provider]  dicyclomine (BENTYL) 20 MG tablet Take 1 tablet (20 mg total) by mouth 2 (two) times daily. 01/14/22   Carlisle Cater, PA-C  guaiFENesin-codeine (ROBITUSSIN AC) 100-10 MG/5ML syrup Take 5 mLs by mouth 3 (three) times daily as needed for cough. 02/12/22   Hayden Rasmussen, MD  HYDROcodone-acetaminophen (NORCO) 5-325 MG tablet Take 1-2 tablets by mouth every 6 (six) hours as needed for severe pain. 04/21/22   Molpus, John, MD  hydrOXYzine (ATARAX/VISTARIL) 10 MG tablet Take 10 mg by mouth 3 (three) times daily as needed.    [provider]  ibuprofen (ADVIL) 600 MG tablet Take 1 tablet (600  mg total) by mouth every 6 (six) hours as needed. 01/07/22   Lacretia Leigh, MD  loperamide (IMODIUM) 2 MG capsule Take 1 capsule (2 mg total) by mouth 4 (four) times daily as needed for diarrhea or loose stools. 01/14/22   Carlisle Cater, PA-C  methocarbamol (ROBAXIN) 500 MG tablet Take 1 tablet (500 mg total) by mouth 2 (two) times daily. 01/07/22   Lacretia Leigh, MD  methylphenidate 18 MG PO CR tablet Take 18 mg by mouth daily.    [provider]  omeprazole (PRILOSEC) 40 MG capsule Take 40 mg by mouth daily. 12/05/21   [provider]  ondansetron (ZOFRAN-ODT) 8 MG disintegrating tablet Take 1 tablet (8 mg total) by mouth every 8 (eight) hours as needed for nausea or vomiting. 04/21/22   Molpus, John, MD  rizatriptan (MAXALT) 10 MG tablet Take 10 mg by mouth as needed for migraine. May repeat in 2 hours if needed    [provider]      Allergies    Patient has no known allergies.    Review of Systems   Review of Systems  Constitutional:  Negative for chills and fever.  Respiratory:  Negative for shortness of breath.   Cardiovascular:  Negative for chest pain.  Gastrointestinal:  Negative for abdominal pain, diarrhea, nausea and vomiting.  Genitourinary:  Positive for dysuria, flank pain and frequency.  All other systems reviewed and are negative.   Physical  Exam Updated Vital Signs BP 136/78 (BP Location: Right Arm)   Pulse 62   Temp 98.3 F (36.8 C) (Oral)   Resp 18   Ht 5\' 2"  (1.575 m)   Wt 81.6 kg   SpO2 100%   BMI 32.92 kg/m  Physical Exam Vitals and nursing note reviewed.  Constitutional:      General: She is not in acute distress.    Appearance: She is not ill-appearing.  HENT:     Head: Normocephalic.  Eyes:     Pupils: Pupils are equal, round, and reactive to light.  Cardiovascular:     Rate and Rhythm: Normal rate and regular rhythm.     Pulses: Normal pulses.     Heart sounds: Normal heart sounds. No murmur heard.    No friction  rub. No gallop.  Pulmonary:     Effort: Pulmonary effort is normal.     Breath sounds: Normal breath sounds.  Abdominal:     General: Abdomen is flat. There is no distension.     Palpations: Abdomen is soft.     Tenderness: There is no abdominal tenderness. There is no guarding or rebound.     Comments: No RUQ tenderness. No CVA tenderness  Musculoskeletal:        General: Normal range of motion.     Cervical back: Neck supple.  Skin:    General: Skin is warm and dry.  Neurological:     General: No focal deficit present.     Mental Status: She is alert.  Psychiatric:        Mood and Affect: Mood normal.        Behavior: Behavior normal.     ED Results / Procedures / Treatments   Labs (all labs ordered are listed, but only abnormal results are displayed) Labs Reviewed  URINALYSIS, ROUTINE W REFLEX MICROSCOPIC - Abnormal; Notable for the following components:      Result Value   Leukocytes,Ua MODERATE (*)    Bacteria, UA RARE (*)    All other components within normal limits  COMPREHENSIVE METABOLIC PANEL - Abnormal; Notable for the following components:   Glucose, Bld 100 (*)    All other components within normal limits  URINE CULTURE  PREGNANCY, URINE  CBC WITH DIFFERENTIAL/PLATELET  LIPASE, BLOOD    EKG None  Radiology CT Renal Stone Study  Result Date: 05/28/2022 CLINICAL DATA:  Abdominal pain and flank pain. Urinary frequency. History of UTI. EXAM: CT ABDOMEN AND PELVIS WITHOUT CONTRAST TECHNIQUE: Multidetector CT imaging of the abdomen and pelvis was performed following the standard protocol without IV contrast. RADIATION DOSE REDUCTION: This exam was performed according to the departmental dose-optimization program which includes automated exposure control, adjustment of the mA and/or kV according to patient size and/or use of iterative reconstruction technique. COMPARISON:  03/09/2022 FINDINGS: Lower chest: No acute abnormality. Hepatobiliary: No focal liver  abnormality. Multiple noncalcified stones identified within the gallbladder. No pericholecystic inflammation noted. No signs of bile duct dilatation. Pancreas: Unremarkable. No pancreatic ductal dilatation or surrounding inflammatory changes. Spleen: Normal in size without focal abnormality. Adrenals/Urinary Tract: Normal adrenal glands. No nephrolithiasis or hydronephrosis. No kidney mass noted. Urinary bladder is unremarkable. Stomach/Bowel: Stomach appears normal. The appendix is visualized and appears normal. No pathologic dilatation of the large or small bowel loops. No bowel wall thickening or inflammation. Vascular/Lymphatic: No significant vascular findings are present. No enlarged abdominal or pelvic lymph nodes. Reproductive: IUD is noted within the uterus. Uterus and bilateral adnexa are  otherwise unremarkable. Other: No free fluid or fluid collection. Musculoskeletal: Bilateral L5 pars defects identified. 7 mm anterolisthesis L5 on S1 noted. No acute osseous findings. IMPRESSION: 1. No acute findings within the abdomen or pelvis. 2. Gallstones. 3. 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 Electronically Signed   By: Signa Kell M.D.   On: 05/28/2022 10:45    Procedures Procedures    Medications Ordered in ED Medications - No data to display  ED Course/ Medical Decision Making/ A&P Clinical Course as of 05/28/22 1128  Tue May 28, 2022  1053 Glori LuisMarland Kitchen): MODERATE [CA]  1053 Bacteria, UA(!): RARE [CA]    Clinical Course User Index [CA] Mannie Stabile, PA-C                             Medical Decision Making Amount and/or Complexity of Data Reviewed Labs: ordered. Decision-making details documented in ED Course. Radiology: ordered and independent interpretation performed. Decision-making details documented in ED Course.   This patient presents to the ED for concern of flank pain, this involves an extensive number of treatment options, and is a  complaint that carries with it a high risk of complications and morbidity.  The differential diagnosis includes pyelonephritis, kidney stone, acute cholecystitis, PE, MSK etiology, shingles, etc.  27 year old female presents to the ED due to right flank pain x 2 weeks associated with urinary frequency and dysuria.  No history of kidney stones.  Denies chest pain and shortness of breath.  Has Mirena IUD for birth control.  No history of blood clots.  Upon arrival, vitals all within normal limits.  Patient in no acute distress.  Benign physical exam.  No right upper quadrant tenderness to suggest gallbladder etiology.  Low suspicion for acute cholecystitis.  No CVA tenderness bilaterally.  No rash to suggest shingles.  Routine labs ordered.  UA to rule out evidence of UTI.  Patient declined pain medication at this time. CT renal study to rule out kidney stone. Low suspicion for PE/DVT.   CBC unremarkable.  No leukocytosis.  Normal hemoglobin.  Lipase normal at 14.  Doubt pancreatitis.  CMP reassuring.  Normal LFTs.  Normal renal function.  No major electrolyte derangements.  UA significant for moderate leukocytes and rare bacteria.  Urine culture pending.  Given urinary symptoms and abdominal pain will treat for UTI.  CT renal study negative for evidence of pyelonephritis or kidney stone.  Does demonstrate gallstones.  No right upper quadrant tenderness.  Low suspicion for acute cholecystitis.  Advised patient to follow-up with general surgery for further evaluation of gallstones and possible cholecystectomy.  This could be related to her biliary colic versus UTI.  Patient discharged with antibiotics. Patient stable for discharge. Strict ED precautions discussed with patient. Patient states understanding and agrees to plan. Patient discharged home in no acute distress and stable vitals  Has PCP Hx of biliary colic       Final Clinical Impression(s) / ED Diagnoses Final diagnoses:  Flank pain  Acute  cystitis without hematuria    Rx / DC Orders ED Discharge Orders          Ordered    cephALEXin (KEFLEX) 500 MG capsule  4 times daily        05/28/22 1127              Mannie Stabile, PA-C 05/28/22 1128    Melene Plan, DO 05/28/22 1141

## 2022-05-28 NOTE — ED Triage Notes (Signed)
Pt to ED c/o right flank pain x 2 weeks, also reports urinary frequency, denies blood in urine, reports hx of UTI.

## 2022-05-29 LAB — URINE CULTURE: Culture: <10000 — AB

## 2022-06-21 ENCOUNTER — Other Ambulatory Visit: Payer: Self-pay

## 2022-06-21 ENCOUNTER — Emergency Department (HOSPITAL_BASED_OUTPATIENT_CLINIC_OR_DEPARTMENT_OTHER)
Admission: EM | Admit: 2022-06-21 | Discharge: 2022-06-21 | Disposition: A | Discharge status: Home / Self Care | Payer: Medicaid Other | Attending: Emergency Medicine | Admitting: Emergency Medicine

## 2022-06-21 ENCOUNTER — Emergency Department (HOSPITAL_BASED_OUTPATIENT_CLINIC_OR_DEPARTMENT_OTHER): Payer: Medicaid Other

## 2022-06-21 ENCOUNTER — Encounter (HOSPITAL_BASED_OUTPATIENT_CLINIC_OR_DEPARTMENT_OTHER): Payer: Self-pay

## 2022-06-21 DIAGNOSIS — R109 Unspecified abdominal pain: Secondary | ICD-10-CM | POA: Diagnosis present

## 2022-06-21 DIAGNOSIS — M5416 Radiculopathy, lumbar region: Secondary | ICD-10-CM | POA: Insufficient documentation

## 2022-06-21 LAB — URINALYSIS, ROUTINE W REFLEX MICROSCOPIC
Bilirubin Urine: NEGATIVE
Glucose, UA: NEGATIVE mg/dL
Ketones, ur: NEGATIVE mg/dL
Nitrite: NEGATIVE
Protein, ur: NEGATIVE mg/dL
Specific Gravity, Urine: 1.006 (ref 1.005–1.030)
pH: 5 (ref 5.0–8.0)

## 2022-06-21 LAB — COMPREHENSIVE METABOLIC PANEL
ALT: 26 U/L (ref 0–44)
AST: 14 U/L — ABNORMAL LOW (ref 15–41)
Albumin: 4.5 g/dL (ref 3.5–5.0)
Alkaline Phosphatase: 64 U/L (ref 38–126)
Anion gap: 7 (ref 5–15)
BUN: 9 mg/dL (ref 6–20)
CO2: 27 mmol/L (ref 22–32)
Calcium: 9.6 mg/dL (ref 8.9–10.3)
Chloride: 102 mmol/L (ref 98–111)
Creatinine, Ser: 0.61 mg/dL (ref 0.44–1.00)
GFR, Estimated: >60 mL/min (ref >60)
Glucose, Bld: 91 mg/dL (ref 70–99)
Potassium: 3.9 mmol/L (ref 3.5–5.1)
Sodium: 136 mmol/L (ref 135–145)
Total Bilirubin: 0.4 mg/dL (ref 0.3–1.2)
Total Protein: 7.2 g/dL (ref 6.5–8.1)

## 2022-06-21 LAB — CBC WITH DIFFERENTIAL/PLATELET
Abs Immature Granulocytes: 0.03 10*3/uL (ref 0.00–0.07)
Basophils Absolute: 0.1 10*3/uL (ref 0.0–0.1)
Basophils Relative: 1 %
Eosinophils Absolute: 0.4 10*3/uL (ref 0.0–0.5)
Eosinophils Relative: 4 %
HCT: 36.3 % (ref 36.0–46.0)
Hemoglobin: 12.4 g/dL (ref 12.0–15.0)
Immature Granulocytes: 0 %
Lymphocytes Relative: 28 %
Lymphs Abs: 3 10*3/uL (ref 0.7–4.0)
MCH: 27.6 pg (ref 26.0–34.0)
MCHC: 34.2 g/dL (ref 30.0–36.0)
MCV: 80.8 fL (ref 80.0–100.0)
Monocytes Absolute: 0.9 10*3/uL (ref 0.1–1.0)
Monocytes Relative: 8 %
Neutro Abs: 6.4 10*3/uL (ref 1.7–7.7)
Neutrophils Relative %: 59 %
Platelets: 438 10*3/uL — ABNORMAL HIGH (ref 150–400)
RBC: 4.49 MIL/uL (ref 3.87–5.11)
RDW: 12.1 % (ref 11.5–15.5)
WBC: 10.9 10*3/uL — ABNORMAL HIGH (ref 4.0–10.5)
nRBC: 0 % (ref 0.0–0.2)

## 2022-06-21 LAB — PREGNANCY, URINE: Preg Test, Ur: NEGATIVE

## 2022-06-21 LAB — LIPASE, BLOOD: Lipase: 20 U/L (ref 11–51)

## 2022-06-21 MED ORDER — CYCLOBENZAPRINE HCL 5 MG PO TABS: 5.0000 mg | ORAL_TABLET | Freq: Three times a day (TID) | ORAL | 0 refills | Status: DC | PRN

## 2022-06-21 MED ORDER — HYDROCODONE-ACETAMINOPHEN 5-325 MG PO TABS: 1.0000 | ORAL_TABLET | Freq: Four times a day (QID) | ORAL | 0 refills | Status: DC | PRN

## 2022-06-21 MED ORDER — METHYLPREDNISOLONE 4 MG PO TBPK: ORAL_TABLET | ORAL | 0 refills | Status: DC

## 2022-06-21 MED ORDER — MORPHINE SULFATE (PF) 4 MG/ML IV SOLN
4.0000 mg | Freq: Once | INTRAVENOUS | Status: AC
  Administered 2022-06-21: 4 mg via INTRAVENOUS
  Filled 2022-06-21: qty 1

## 2022-06-21 MED ORDER — KETOROLAC TROMETHAMINE 30 MG/ML IJ SOLN
30.0000 mg | Freq: Once | INTRAMUSCULAR | Status: AC
  Administered 2022-06-21: 30 mg via INTRAVENOUS
  Filled 2022-06-21: qty 1

## 2022-06-21 MED ORDER — ONDANSETRON HCL 4 MG/2ML IJ SOLN
4.0000 mg | Freq: Once | INTRAMUSCULAR | Status: AC
  Administered 2022-06-21: 4 mg via INTRAVENOUS
  Filled 2022-06-21: qty 2

## 2022-06-21 MED ORDER — LIDOCAINE 5 % EX PTCH
1.0000 | MEDICATED_PATCH | CUTANEOUS | Status: DC
  Administered 2022-06-21: 1 via TRANSDERMAL
  Filled 2022-06-21: qty 1

## 2022-06-21 NOTE — ED Notes (Signed)
Pt has now returned from CT dept via w/c - remains awake and alert; no acute changes/distress noted.

## 2022-06-21 NOTE — ED Triage Notes (Signed)
Patient here POV from Home.  Endorses Right Sided Flank pain for 2 Days. Constant and Consistent since it began.  No N/V/D. No Trauma. No Fevers.   NAD noted during Triage. A&Ox4. GCS 15. Ambulatory.

## 2022-06-21 NOTE — ED Notes (Signed)
Patient transported to CT via w/c. 

## 2022-06-21 NOTE — ED Provider Notes (Signed)
[facility] Provider Note   CSN: [id number] Arrival date & time: 06/21/22  1946     History  Chief Complaint  Patient presents with   Flank Pain    Cassandra Hill is a 27 y.o. female history of previous kidney stone and gallstone here presenting with right flank pain.  Patient has been having right flank pain for the last 2 to 3 days.  She states that she picked up her 41-year-old son 2 days ago and thought she strained her back.  Patient states that she has worsening pain since then.  Patient states that it is in the right flank and radiate down the right lower quadrant.  She was concerned that she has a kidney stone.  She denies any nausea or vomiting.  She states that she has difficulty turning herself due to pain.  Denies pain radiating down the leg or trouble urinating.  The history is provided by the patient.       Home Medications Prior to Admission medications   Medication Sig Start Date End Date Taking? Authorizing Provider  buPROPion (WELLBUTRIN XL) 150 MG 24 hr tablet Take 150 mg by mouth daily.    [provider]  cephALEXin (KEFLEX) 500 MG capsule Take 1 capsule (500 mg total) by mouth 4 (four) times daily. 05/28/22   Suzy Bouchard, PA-C  dicyclomine (BENTYL) 20 MG tablet Take 1 tablet (20 mg total) by mouth 2 (two) times daily. 01/14/22   Carlisle Cater, PA-C  guaiFENesin-codeine (ROBITUSSIN AC) 100-10 MG/5ML syrup Take 5 mLs by mouth 3 (three) times daily as needed for cough. 02/12/22   Hayden Rasmussen, MD  HYDROcodone-acetaminophen (NORCO) 5-325 MG tablet Take 1-2 tablets by mouth every 6 (six) hours as needed for severe pain. 04/21/22   Molpus, John, MD  hydrOXYzine (ATARAX/VISTARIL) 10 MG tablet Take 10 mg by mouth 3 (three) times daily as needed.    [provider]  ibuprofen (ADVIL) 600 MG tablet Take 1 tablet (600 mg total) by mouth every 6 (six) hours as needed. 01/07/22   Lacretia Leigh, MD   loperamide (IMODIUM) 2 MG capsule Take 1 capsule (2 mg total) by mouth 4 (four) times daily as needed for diarrhea or loose stools. 01/14/22   Carlisle Cater, PA-C  methocarbamol (ROBAXIN) 500 MG tablet Take 1 tablet (500 mg total) by mouth 2 (two) times daily. 01/07/22   Lacretia Leigh, MD  methylphenidate 18 MG PO CR tablet Take 18 mg by mouth daily.    [provider]  omeprazole (PRILOSEC) 40 MG capsule Take 40 mg by mouth daily. 12/05/21   [provider]  ondansetron (ZOFRAN-ODT) 8 MG disintegrating tablet Take 1 tablet (8 mg total) by mouth every 8 (eight) hours as needed for nausea or vomiting. 04/21/22   Molpus, John, MD  rizatriptan (MAXALT) 10 MG tablet Take 10 mg by mouth as needed for migraine. May repeat in 2 hours if needed    [provider]      Allergies    Patient has no known allergies.    Review of Systems   Review of Systems  Genitourinary:  Positive for flank pain.  All other systems reviewed and are negative.   Physical Exam Updated Vital Signs BP (!) 142/85 (BP Location: Right Arm)   Pulse 75   Temp 98.2 F (36.8 C) (Oral)   Resp 18   Ht 5' 2"$  (1.575 m)   Wt 81.6 kg   SpO2  100%   BMI 32.90 kg/m  Physical Exam Vitals and nursing note reviewed.  Constitutional:      Comments: Uncomfortable  HENT:     Head: Normocephalic.     Nose: Nose normal.     Mouth/Throat:     Mouth: Mucous membranes are moist.  Eyes:     Extraocular Movements: Extraocular movements intact.     Pupils: Pupils are equal, round, and reactive to light.  Cardiovascular:     Rate and Rhythm: Normal rate and regular rhythm.     Pulses: Normal pulses.     Heart sounds: Normal heart sounds.  Pulmonary:     Effort: Pulmonary effort is normal.     Breath sounds: Normal breath sounds.  Abdominal:     General: Abdomen is flat.     Palpations: Abdomen is soft.     Comments: Mild right CVA tenderness versus paralumbar tenderness  Musculoskeletal:         General: Normal range of motion.     Cervical back: Normal range of motion and neck supple.  Skin:    General: Skin is warm.     Capillary Refill: Capillary refill takes less than 2 seconds.  Neurological:     General: No focal deficit present.     Mental Status: She is oriented to person, place, and time.     Comments: No saddle anesthesia.  Patient has good reflexes bilateral knees.  Difficult to examine her strength due to pain  Psychiatric:        Mood and Affect: Mood normal.        Behavior: Behavior normal.     ED Results / Procedures / Treatments   Labs (all labs ordered are listed, but only abnormal results are displayed) Labs Reviewed  URINALYSIS, ROUTINE W REFLEX MICROSCOPIC - Abnormal; Notable for the following components:      Result Value   Color, Urine COLORLESS (*)    Hgb urine dipstick TRACE (*)    Leukocytes,Ua TRACE (*)    Bacteria, UA RARE (*)    All other components within normal limits  PREGNANCY, URINE  CBC WITH DIFFERENTIAL/PLATELET  COMPREHENSIVE METABOLIC PANEL  LIPASE, BLOOD    EKG None  Radiology No results found.  Procedures Procedures    Medications Ordered in ED Medications  lidocaine (LIDODERM) 5 % 1 patch (1 patch Transdermal Patch Applied 06/21/22 2057)  morphine (PF) 4 MG/ML injection 4 mg (4 mg Intravenous Given 06/21/22 2056)  ondansetron (ZOFRAN) injection 4 mg (4 mg Intravenous Given 06/21/22 2057)  ketorolac (TORADOL) 30 MG/ML injection 30 mg (30 mg Intravenous Given 06/21/22 2057)    ED Course/ Medical Decision Making/ A&P                             Medical Decision Making Cassandra Hill is a 27 y.o. female here presenting with flank pain. Consider renal colic versus lumbar strain, I have low suspicion for pyelonephritis.  Plan to get CBC and CMP and CT renal stone and urinalysis.   10:13 PM Reviewed patient's labs and independently reviewed CT scan.  Labs were unremarkable.  UA unremarkable.  CT showed simple adnexal  cyst on the left side.  Patient also has cholelithiasis without cholecystitis.  Patient does have degenerative disc disease.  I wonder if she has a slipped disc causing her back pain.  Patient is feeling much better now.  I was able to examine her and  she has no saddle anesthesia.  Patient has normal reflexes bilateral knees.  She is able to flex extend bilateral hips.  Patient is able to ambulate by herself.  Will give a course of steroids and pain medicine muscle relaxant and will refer to neurosurgery outpatient.  Problems Addressed: Lumbar radiculopathy: acute illness or injury  Amount and/or Complexity of Data Reviewed Labs: ordered. Decision-making details documented in ED Course. Radiology: ordered and independent interpretation performed. Decision-making details documented in ED Course.  Risk Prescription drug management.    Final Clinical Impression(s) / ED Diagnoses Final diagnoses:  None    Rx / DC Orders ED Discharge Orders     None         Drenda Freeze, MD 06/21/22 2215

## 2022-06-21 NOTE — ED Notes (Signed)
Pt agreeable with d/c plan as discussed by provider- this nurse has verbally reinforced d/c instructions and provided pt with written copy.  Pt acknowledges verbal understanding gan denies any addl questions concerns needs - pt declines w/c for xport to ED lobby where she will await uber

## 2022-06-21 NOTE — Discharge Instructions (Signed)
You likely have a pinched nerve in your back   Take Tylenol or Motrin for pain.  Take Flexeril for muscle spasm.  Take Medrol Dosepak as prescribed  Please call neurosurgeon next week for follow-up  Return to ER if you have worse back pain or trouble walking or trouble urinating

## 2022-07-08 ENCOUNTER — Emergency Department (HOSPITAL_BASED_OUTPATIENT_CLINIC_OR_DEPARTMENT_OTHER): Payer: Medicaid Other

## 2022-07-08 ENCOUNTER — Other Ambulatory Visit: Payer: Self-pay

## 2022-07-08 ENCOUNTER — Emergency Department (HOSPITAL_BASED_OUTPATIENT_CLINIC_OR_DEPARTMENT_OTHER)
Admission: EM | Admit: 2022-07-08 | Discharge: 2022-07-08 | Disposition: A | Discharge status: Home / Self Care | Payer: Medicaid Other | Attending: Emergency Medicine | Admitting: Emergency Medicine

## 2022-07-08 ENCOUNTER — Encounter (HOSPITAL_BASED_OUTPATIENT_CLINIC_OR_DEPARTMENT_OTHER): Payer: Self-pay

## 2022-07-08 DIAGNOSIS — U071 COVID-19: Secondary | ICD-10-CM | POA: Diagnosis not present

## 2022-07-08 DIAGNOSIS — R059 Cough, unspecified: Secondary | ICD-10-CM | POA: Diagnosis present

## 2022-07-08 LAB — RESP PANEL BY RT-PCR (RSV, FLU A&B, COVID)  RVPGX2
Influenza A by PCR: NEGATIVE
Influenza B by PCR: NEGATIVE
Resp Syncytial Virus by PCR: NEGATIVE
SARS Coronavirus 2 by RT PCR: POSITIVE — AB

## 2022-07-08 LAB — GROUP A STREP BY PCR: Group A Strep by PCR: NOT DETECTED

## 2022-07-08 MED ORDER — BENZONATATE 100 MG PO CAPS
100.0000 mg | ORAL_CAPSULE | Freq: Once | ORAL | Status: AC
  Administered 2022-07-08: 100 mg via ORAL
  Filled 2022-07-08: qty 1

## 2022-07-08 MED ORDER — BENZONATATE 100 MG PO CAPS: 100.0000 mg | ORAL_CAPSULE | Freq: Three times a day (TID) | ORAL | 0 refills | Status: DC

## 2022-07-08 NOTE — Discharge Instructions (Signed)
It was a pleasure taking care of you today.  As discussed, your COVID test was positive.  Your chest x-ray did not show evidence of pneumonia.  I am sending you home with cough medication.  Take as needed.  Continue to take ibuprofen or Tylenol as needed for pain.  Follow-up with PCP if symptoms do not improve over the next week.  Return to the ER for new or worsening symptoms.

## 2022-07-08 NOTE — ED Triage Notes (Signed)
Patient here POV from Home.  Endorses Cough and Sore Throat that began 4 Days ago.  Green and Thick Mucous.   NAD noted during Triage. A&Ox4. GCS 15. Ambulatory.

## 2022-07-08 NOTE — ED Provider Notes (Signed)
[place] EMERGENCY DEPARTMENT AT [facility] Provider Note   CSN: [id number] Arrival date & time: 07/08/22  2031     History  Chief Complaint  Patient presents with   Sore Throat    Cassandra Hill is a 27 y.o. female with a past medical history significant for IBS and history of migraines who presents to the ED due to cough and sore throat x 4 days.  Patient admits to a productive cough with green phlegm.  No chest pain or shortness of breath.  Patient admits to pain with swallowing.  No difficulty swallowing.  She endorses a hoarse voice.  No muffled/hot potato voice.  No sick contacts.  Denies abdominal pain, nausea, vomiting, diarrhea.  History obtained from patient and past medical records. No interpreter used during encounter.       Home Medications Prior to Admission medications   Medication Sig Start Date End Date Taking? Authorizing Provider  benzonatate (TESSALON) 100 MG capsule Take 1 capsule (100 mg total) by mouth every 8 (eight) hours. 07/08/22  Yes Shermaine Brigham, Merla Riches, PA-C  buPROPion (WELLBUTRIN XL) 150 MG 24 hr tablet Take 150 mg by mouth daily.    [provider]  cephALEXin (KEFLEX) 500 MG capsule Take 1 capsule (500 mg total) by mouth 4 (four) times daily. 05/28/22   Mannie Stabile, PA-C  cyclobenzaprine (FLEXERIL) 5 MG tablet Take 1 tablet (5 mg total) by mouth 3 (three) times daily as needed for muscle spasms. 06/21/22   Charlynne Pander, MD  dicyclomine (BENTYL) 20 MG tablet Take 1 tablet (20 mg total) by mouth 2 (two) times daily. 01/14/22   Renne Crigler, PA-C  guaiFENesin-codeine (ROBITUSSIN AC) 100-10 MG/5ML syrup Take 5 mLs by mouth 3 (three) times daily as needed for cough. 02/12/22   Terrilee Files, MD  HYDROcodone-acetaminophen (NORCO/VICODIN) 5-325 MG tablet Take 1 tablet by mouth every 6 (six) hours as needed. 06/21/22   Charlynne Pander, MD  hydrOXYzine (ATARAX/VISTARIL) 10 MG tablet Take 10 mg by mouth 3 (three) times daily  as needed.    [provider]  ibuprofen (ADVIL) 600 MG tablet Take 1 tablet (600 mg total) by mouth every 6 (six) hours as needed. 01/07/22   Lorre Nick, MD  loperamide (IMODIUM) 2 MG capsule Take 1 capsule (2 mg total) by mouth 4 (four) times daily as needed for diarrhea or loose stools. 01/14/22   Renne Crigler, PA-C  methocarbamol (ROBAXIN) 500 MG tablet Take 1 tablet (500 mg total) by mouth 2 (two) times daily. 01/07/22   Lorre Nick, MD  methylphenidate 18 MG PO CR tablet Take 18 mg by mouth daily.    [provider]  methylPREDNISolone (MEDROL DOSEPAK) 4 MG TBPK tablet Use as directed 06/21/22   Charlynne Pander, MD  omeprazole (PRILOSEC) 40 MG capsule Take 40 mg by mouth daily. 12/05/21   [provider]  ondansetron (ZOFRAN-ODT) 8 MG disintegrating tablet Take 1 tablet (8 mg total) by mouth every 8 (eight) hours as needed for nausea or vomiting. 04/21/22   Molpus, John, MD  rizatriptan (MAXALT) 10 MG tablet Take 10 mg by mouth as needed for migraine. May repeat in 2 hours if needed    [provider]      Allergies    Patient has no known allergies.    Review of Systems   Review of Systems  Constitutional:  Negative for chills and fever.  HENT:  Positive for sore throat. Negative for trouble swallowing.  Respiratory:  Positive for cough. Negative for shortness of breath.   Cardiovascular:  Negative for chest pain.  All other systems reviewed and are negative.   Physical Exam Updated Vital Signs BP (!) 126/96 (BP Location: Right Arm)   Pulse 89   Temp 98.8 F (37.1 C) (Oral)   Resp 18   Ht 5\' 2"  (1.575 m)   Wt 81.6 kg   SpO2 100%   BMI 32.92 kg/m  Physical Exam Vitals and nursing note reviewed.  Constitutional:      General: She is not in acute distress.    Appearance: She is not ill-appearing.  HENT:     Head: Normocephalic.  Eyes:     Pupils: Pupils are equal, round, and reactive to light.  Cardiovascular:     Rate and  Rhythm: Normal rate and regular rhythm.     Pulses: Normal pulses.     Heart sounds: Normal heart sounds. No murmur heard.    No friction rub. No gallop.  Pulmonary:     Effort: Pulmonary effort is normal.     Breath sounds: Normal breath sounds.  Abdominal:     General: Abdomen is flat. There is no distension.     Palpations: Abdomen is soft.     Tenderness: There is no abdominal tenderness. There is no guarding or rebound.  Musculoskeletal:        General: Normal range of motion.     Cervical back: Neck supple.  Skin:    General: Skin is warm and dry.  Neurological:     General: No focal deficit present.     Mental Status: She is alert.  Psychiatric:        Mood and Affect: Mood normal.        Behavior: Behavior normal.     ED Results / Procedures / Treatments   Labs (all labs ordered are listed, but only abnormal results are displayed) Labs Reviewed  RESP PANEL BY RT-PCR (RSV, FLU A&B, COVID)  RVPGX2 - Abnormal; Notable for the following components:      Result Value   SARS Coronavirus 2 by RT PCR POSITIVE (*)    All other components within normal limits  GROUP A STREP BY PCR    EKG None  Radiology DG Chest Portable 1 View  Result Date: 07/08/2022 CLINICAL DATA:  Cough and sore throat. EXAM: PORTABLE CHEST 1 VIEW COMPARISON:  February 12, 2022 FINDINGS: The heart size and mediastinal contours are within normal limits. Both lungs are clear. The visualized skeletal structures are unremarkable. IMPRESSION: No active disease. Electronically Signed   By: Aram Candela M.D.   On: 07/08/2022 22:09    Procedures Procedures    Medications Ordered in ED Medications  benzonatate (TESSALON) capsule 100 mg (100 mg Oral Given 07/08/22 2156)    ED Course/ Medical Decision Making/ A&P Clinical Course as of 07/08/22 2223  Mon Jul 08, 2022  2221 SARS Coronavirus 2 by RT PCR(!): POSITIVE [CA]    Clinical Course User Index [CA] Mannie Stabile, PA-C                              Medical Decision Making Amount and/or Complexity of Data Reviewed Labs: ordered. Decision-making details documented in ED Course. Radiology: ordered and independent interpretation performed. Decision-making details documented in ED Course.  Risk Prescription drug management.   27 year old female presents to the ED due to productive cough and sore throat  x 4 days.  No shortness of breath or chest pain.  Upon arrival, patient afebrile, not tachycardic or hypoxic.  Patient in no acute distress.  Reassuring physical exam.  Lungs clear to auscultation bilaterally.  RVP ordered.  Strep.  Chest x-ray to rule out evidence of pneumonia given productive cough.  Tessalon given.  Strep negative.  COVID-positive.  Chest x-ray personally reviewed and interpreted which is negative for signs of pneumonia, pneumothorax or widened mediastinum.  Suspect symptoms related to COVID-19 infection.  Patient discharged with symptomatic treatment.  Quarantine guidelines discussed with patient. Strict ED precautions discussed with patient. Patient states understanding and agrees to plan. Patient discharged home in no acute distress and stable vitals  Has PCP       Final Clinical Impression(s) / ED Diagnoses Final diagnoses:  COVID-19 virus infection    Rx / DC Orders ED Discharge Orders          Ordered    benzonatate (TESSALON) 100 MG capsule  Every 8 hours        07/08/22 2223              Jesusita Oka 07/08/22 2224    Terrilee Files, MD 07/09/22 [phone]

## 2022-07-08 NOTE — ED Notes (Signed)
Patient verbalizes understanding of discharge instructions. Opportunity for questioning and answers were provided. Armband removed by staff, pt discharged from ED. Ambulated out to lobby  

## 2022-10-28 ENCOUNTER — Other Ambulatory Visit: Payer: Self-pay

## 2022-10-28 ENCOUNTER — Emergency Department (HOSPITAL_BASED_OUTPATIENT_CLINIC_OR_DEPARTMENT_OTHER)
Admission: EM | Admit: 2022-10-28 | Discharge: 2022-10-29 | Disposition: A | Discharge status: Home / Self Care | Payer: Medicaid Other | Attending: Emergency Medicine | Admitting: Emergency Medicine

## 2022-10-28 DIAGNOSIS — L03039 Cellulitis of unspecified toe: Secondary | ICD-10-CM

## 2022-10-28 DIAGNOSIS — L03031 Cellulitis of right toe: Secondary | ICD-10-CM | POA: Insufficient documentation

## 2022-10-28 NOTE — ED Triage Notes (Signed)
Complain of ingrown toenail. Big toe. Left. Pus seen under skin around nail. Has been developing over a week.

## 2022-10-29 MED ORDER — CEPHALEXIN 500 MG PO CAPS: 500.0000 mg | ORAL_CAPSULE | Freq: Four times a day (QID) | ORAL | 0 refills | Status: DC

## 2022-10-29 MED ORDER — CEPHALEXIN 250 MG PO CAPS
500.0000 mg | ORAL_CAPSULE | Freq: Once | ORAL | Status: AC
  Administered 2022-10-29: 500 mg via ORAL
  Filled 2022-10-29: qty 2

## 2022-10-29 NOTE — Discharge Instructions (Signed)
Begin taking Keflex as prescribed.  Apply warm compresses as frequently as possible for the next several days.  Return to the ER if symptoms significantly worsen or change.

## 2022-10-29 NOTE — ED Notes (Signed)
RN reviewed discharge instructions with pt. Pt verbalized understanding and had no further questions 

## 2022-10-29 NOTE — ED Provider Notes (Signed)
[place] EMERGENCY DEPARTMENT AT [facility] Provider Note   CSN: [id number] Arrival date & time: 10/28/22  2247     History  Chief Complaint  Patient presents with   Ingrown Toenail    Cassandra Hill is a 27 y.o. female.  Patient is a 27 year old female presenting with complaints of pain and swelling to the nail of the right great toe.  This has been worsening over the past week.  No injury or trauma.  She has had ingrown toenails in the past.  This feels similar, but somewhat different.  No fevers or chills.  The history is provided by the patient.       Home Medications Prior to Admission medications   Medication Sig Start Date End Date Taking? Authorizing Provider  benzonatate (TESSALON) 100 MG capsule Take 1 capsule (100 mg total) by mouth every 8 (eight) hours. 07/08/22   Mannie Stabile, PA-C  buPROPion (WELLBUTRIN XL) 150 MG 24 hr tablet Take 150 mg by mouth daily.    [provider]  cephALEXin (KEFLEX) 500 MG capsule Take 1 capsule (500 mg total) by mouth 4 (four) times daily. 05/28/22   Mannie Stabile, PA-C  cyclobenzaprine (FLEXERIL) 5 MG tablet Take 1 tablet (5 mg total) by mouth 3 (three) times daily as needed for muscle spasms. 06/21/22   Charlynne Pander, MD  dicyclomine (BENTYL) 20 MG tablet Take 1 tablet (20 mg total) by mouth 2 (two) times daily. 01/14/22   Renne Crigler, PA-C  guaiFENesin-codeine (ROBITUSSIN AC) 100-10 MG/5ML syrup Take 5 mLs by mouth 3 (three) times daily as needed for cough. 02/12/22   Terrilee Files, MD  HYDROcodone-acetaminophen (NORCO/VICODIN) 5-325 MG tablet Take 1 tablet by mouth every 6 (six) hours as needed. 06/21/22   Charlynne Pander, MD  hydrOXYzine (ATARAX/VISTARIL) 10 MG tablet Take 10 mg by mouth 3 (three) times daily as needed.    [provider]  ibuprofen (ADVIL) 600 MG tablet Take 1 tablet (600 mg total) by mouth every 6 (six) hours as needed. 01/07/22   Lorre Nick, MD  loperamide  (IMODIUM) 2 MG capsule Take 1 capsule (2 mg total) by mouth 4 (four) times daily as needed for diarrhea or loose stools. 01/14/22   Renne Crigler, PA-C  methocarbamol (ROBAXIN) 500 MG tablet Take 1 tablet (500 mg total) by mouth 2 (two) times daily. 01/07/22   Lorre Nick, MD  methylphenidate 18 MG PO CR tablet Take 18 mg by mouth daily.    [provider]  methylPREDNISolone (MEDROL DOSEPAK) 4 MG TBPK tablet Use as directed 06/21/22   Charlynne Pander, MD  omeprazole (PRILOSEC) 40 MG capsule Take 40 mg by mouth daily. 12/05/21   [provider]  ondansetron (ZOFRAN-ODT) 8 MG disintegrating tablet Take 1 tablet (8 mg total) by mouth every 8 (eight) hours as needed for nausea or vomiting. 04/21/22   Molpus, John, MD  rizatriptan (MAXALT) 10 MG tablet Take 10 mg by mouth as needed for migraine. May repeat in 2 hours if needed    [provider]      Allergies    Patient has no known allergies.    Review of Systems   Review of Systems  All other systems reviewed and are negative.   Physical Exam Updated Vital Signs BP 114/89   Pulse 73   Temp 98.7 F (37.1 C) (Oral)   Resp 16   SpO2 100%  Physical Exam Vitals and nursing note  reviewed.  Constitutional:      Appearance: Normal appearance.  Pulmonary:     Effort: Pulmonary effort is normal.  Skin:    General: Skin is warm and dry.  Neurological:     Mental Status: She is alert.     ED Results / Procedures / Treatments   Labs (all labs ordered are listed, but only abnormal results are displayed) Labs Reviewed - No data to display  EKG None  Radiology No results found.  Procedures Procedures    Medications Ordered in ED Medications - No data to display  ED Course/ Medical Decision Making/ A&P  Patient presenting with pain and swelling to the right great toe.  There is purulent material noted under the cuticle at the base of the nail.  This was incised using a #11 blade and pus expressed.   Patient to be discharged with Keflex and as needed return.  Final Clinical Impression(s) / ED Diagnoses Final diagnoses:  None    Rx / DC Orders ED Discharge Orders     None         Geoffery Lyons, MD 10/29/22 0025

## 2022-10-29 NOTE — ED Notes (Signed)
ED Provider at bedside. 

## 2022-12-23 ENCOUNTER — Telehealth: Payer: Self-pay

## 2022-12-23 NOTE — Telephone Encounter (Signed)
Received referral from [place] [facility] at [facility] - referral is for fatigue, not restorative sleep - eval for OSA. Placed in sleep mailbox

## 2023-01-24 ENCOUNTER — Other Ambulatory Visit: Payer: Self-pay

## 2023-01-24 ENCOUNTER — Encounter (HOSPITAL_BASED_OUTPATIENT_CLINIC_OR_DEPARTMENT_OTHER): Payer: Self-pay | Admitting: Emergency Medicine

## 2023-01-24 DIAGNOSIS — Z5321 Procedure and treatment not carried out due to patient leaving prior to being seen by health care provider: Secondary | ICD-10-CM | POA: Diagnosis not present

## 2023-01-24 DIAGNOSIS — M79674 Pain in right toe(s): Secondary | ICD-10-CM | POA: Diagnosis present

## 2023-01-24 NOTE — ED Triage Notes (Signed)
Pt with pain to right great toe.  Has a redness and callous to inner right great toe.  Pt states she has pain if she bumps it into something.  Took amoxicillin she had "left over" and this helped maybe as well.

## 2023-01-25 ENCOUNTER — Emergency Department (HOSPITAL_BASED_OUTPATIENT_CLINIC_OR_DEPARTMENT_OTHER)
Admission: EM | Admit: 2023-01-25 | Discharge: 2023-01-25 | Disposition: A | Discharge status: Home / Self Care | Payer: Medicaid Other | Attending: Emergency Medicine | Admitting: Emergency Medicine

## 2023-04-10 ENCOUNTER — Emergency Department (HOSPITAL_BASED_OUTPATIENT_CLINIC_OR_DEPARTMENT_OTHER)
Admission: EM | Admit: 2023-04-10 | Discharge: 2023-04-10 | Disposition: A | Discharge status: Home / Self Care | Payer: Medicaid Other | Attending: Emergency Medicine | Admitting: Emergency Medicine

## 2023-04-10 ENCOUNTER — Other Ambulatory Visit: Payer: Self-pay

## 2023-04-10 ENCOUNTER — Emergency Department (HOSPITAL_BASED_OUTPATIENT_CLINIC_OR_DEPARTMENT_OTHER): Payer: Medicaid Other | Admitting: Radiology

## 2023-04-10 DIAGNOSIS — J069 Acute upper respiratory infection, unspecified: Secondary | ICD-10-CM | POA: Diagnosis not present

## 2023-04-10 DIAGNOSIS — Z1152 Encounter for screening for COVID-19: Secondary | ICD-10-CM | POA: Insufficient documentation

## 2023-04-10 DIAGNOSIS — R059 Cough, unspecified: Secondary | ICD-10-CM | POA: Diagnosis present

## 2023-04-10 LAB — RESP PANEL BY RT-PCR (RSV, FLU A&B, COVID)  RVPGX2
Influenza A by PCR: NEGATIVE
Influenza B by PCR: NEGATIVE
Resp Syncytial Virus by PCR: NEGATIVE
SARS Coronavirus 2 by RT PCR: NEGATIVE

## 2023-04-10 MED ORDER — PREDNISONE 20 MG PO TABS: 40.0000 mg | ORAL_TABLET | Freq: Every day | ORAL | 0 refills | Status: DC

## 2023-04-10 MED ORDER — BENZONATATE 100 MG PO CAPS: 100.0000 mg | ORAL_CAPSULE | Freq: Three times a day (TID) | ORAL | 0 refills | Status: DC | PRN

## 2023-04-10 MED ORDER — CETIRIZINE-PSEUDOEPHEDRINE ER 5-120 MG PO TB12: 1.0000 | ORAL_TABLET | Freq: Every day | ORAL | 0 refills | Status: DC | PRN

## 2023-04-10 MED ORDER — TRIAMCINOLONE ACETONIDE 55 MCG/ACT NA AERO: 2.0000 | INHALATION_SPRAY | Freq: Every day | NASAL | 12 refills | Status: AC

## 2023-04-10 MED ORDER — ALBUTEROL SULFATE HFA 108 (90 BASE) MCG/ACT IN AERS
1.0000 | INHALATION_SPRAY | Freq: Once | RESPIRATORY_TRACT | Status: AC
  Administered 2023-04-10: 1 via RESPIRATORY_TRACT
  Filled 2023-04-10: qty 6.7

## 2023-04-10 MED ORDER — CETIRIZINE-PSEUDOEPHEDRINE ER 5-120 MG PO TB12: 1.0000 | ORAL_TABLET | Freq: Every day | ORAL | 0 refills | Status: AC | PRN

## 2023-04-10 MED ORDER — TRIAMCINOLONE ACETONIDE 55 MCG/ACT NA AERO: 2.0000 | INHALATION_SPRAY | Freq: Every day | NASAL | 12 refills | Status: DC

## 2023-04-10 MED ORDER — PREDNISONE 20 MG PO TABS: 40.0000 mg | ORAL_TABLET | Freq: Every day | ORAL | 0 refills | Status: AC

## 2023-04-10 MED ORDER — AEROCHAMBER PLUS FLO-VU SMALL MISC
1.0000 | Freq: Once | Status: AC
  Administered 2023-04-10: 1
  Filled 2023-04-10: qty 1

## 2023-04-10 NOTE — ED Provider Notes (Signed)
[place] EMERGENCY DEPARTMENT AT [facility] Provider Note   CSN: [id number] Arrival date & time: 04/10/23  0857     History  Chief Complaint  Patient presents with   Sore Throat    Cassandra Hill is a 27 y.o. female.  HPI   27 year old female presents emergency department with complaints of cough, nasal congestion, sore throat, chills.  Patient reports illness for the past 3 days or so.  States that her husband as well as multiple of her children have been ill over the past 2 weeks.  Has been taking at home NyQuil/DayQuil with some improvement of symptoms.  Presents emergency department for reevaluation.  States that her birthday is on [birth year] and she wants to feel better before her birthday.  Denies any abdominal pain, nausea, vomiting, chest pain, shortness of breath, urinary symptoms, change in bowel habits.  Past medical history significant for IBS, migraine  Home Medications Prior to Admission medications   Medication Sig Start Date End Date Taking? Authorizing Provider  benzonatate (TESSALON) 100 MG capsule Take 1 capsule (100 mg total) by mouth 3 (three) times daily as needed. 04/10/23   Peter Garter, PA  buPROPion (WELLBUTRIN XL) 150 MG 24 hr tablet Take 150 mg by mouth daily.    [provider]  cetirizine-pseudoephedrine (ZYRTEC-D) 5-120 MG tablet Take 1 tablet by mouth daily as needed for allergies. 04/10/23   Sherian Maroon A, PA  hydrOXYzine (ATARAX/VISTARIL) 10 MG tablet Take 10 mg by mouth 3 (three) times daily as needed.    [provider]  methylphenidate 18 MG PO CR tablet Take 18 mg by mouth daily.    [provider]  omeprazole (PRILOSEC) 40 MG capsule Take 40 mg by mouth daily. 12/05/21   [provider]  predniSONE (DELTASONE) 20 MG tablet Take 2 tablets (40 mg total) by mouth daily with breakfast for 5 days. 04/10/23 04/15/23  Peter Garter, PA  rizatriptan (MAXALT) 10 MG tablet Take 10 mg by mouth  as needed for migraine. May repeat in 2 hours if needed    [provider]  triamcinolone (NASACORT) 55 MCG/ACT AERO nasal inhaler Place 2 sprays into the nose daily. 04/10/23   Peter Garter, PA      Allergies    Patient has no known allergies.    Review of Systems   Review of Systems  All other systems reviewed and are negative.   Physical Exam Updated Vital Signs BP (!) 129/95 (BP Location: Right Arm)   Pulse 77   Temp 98.5 F (36.9 C) (Oral)   Resp 16   SpO2 99%  Physical Exam Vitals and nursing note reviewed.  Constitutional:      General: She is not in acute distress.    Appearance: She is well-developed.  HENT:     Head: Normocephalic and atraumatic.     Nose: Congestion and rhinorrhea present.     Mouth/Throat:     Comments: Mild posterior pharyngeal erythema.  Uvula midline rise symmetric with phonation.  No sublingual or significant swelling.  Tonsils 1+ bilaterally without exudate.  No trismus. Eyes:     Conjunctiva/sclera: Conjunctivae normal.  Cardiovascular:     Rate and Rhythm: Normal rate and regular rhythm.     Heart sounds: No murmur heard. Pulmonary:     Effort: Pulmonary effort is normal. No respiratory distress.     Comments: Faint wheeze auscultated bilateral lung fields. Abdominal:     Palpations: Abdomen is soft.  Tenderness: There is no abdominal tenderness.  Musculoskeletal:        General: No swelling.     Cervical back: Neck supple.     Right lower leg: No edema.     Left lower leg: No edema.  Skin:    General: Skin is warm and dry.     Capillary Refill: Capillary refill takes less than 2 seconds.  Neurological:     Mental Status: She is alert.  Psychiatric:        Mood and Affect: Mood normal.     ED Results / Procedures / Treatments   Labs (all labs ordered are listed, but only abnormal results are displayed) Labs Reviewed  RESP PANEL BY RT-PCR (RSV, FLU A&B, COVID)  RVPGX2    EKG None  Radiology No  results found.  Procedures Procedures    Medications Ordered in ED Medications  albuterol (VENTOLIN HFA) 108 (90 Base) MCG/ACT inhaler 1 puff (1 puff Inhalation Given 04/10/23 0943)  AeroChamber Plus Flo-Vu Small device MISC 1 each (1 each Other Given 04/10/23 0951)    ED Course/ Medical Decision Making/ A&P                                 Medical Decision Making Amount and/or Complexity of Data Reviewed Radiology: ordered.  Risk OTC drugs. Prescription drug management.   This patient presents to the ED for concern of cough, nasal congestion, sore throat, this involves an extensive number of treatment options, and is a complaint that carries with it a high risk of complications and morbidity.  The differential diagnosis includes viral URI, COVID, RSV, influenza, pneumonia, other   Co morbidities that complicate the patient evaluation  See HPI   Additional history obtained:  Additional history obtained from EMR External records from outside source obtained and reviewed including hospital records   Lab Tests:  I Ordered, and personally interpreted labs.  The pertinent results include: Respiratory viral panel negative   Imaging Studies ordered:  I ordered imaging studies including chest x-ray I independently visualized and interpreted imaging which showed no acute cardiopulmonary abnormalities I agree with the radiologist interpretation   Cardiac Monitoring: / EKG:  The patient was maintained on a cardiac monitor.  I personally viewed and interpreted the cardiac monitored which showed an underlying rhythm of: Sinus rhythm   Consultations Obtained:  N/a   Problem List / ED Course / Critical interventions / Medication management  Viral URI with cough I ordered medication including albuterol   Reevaluation of the patient after these medicines showed that the patient improved I have reviewed the patients home medicines and have made adjustments as  needed   Social Determinants of Health:  Denies tobacco, licit drug use   Test / Admission - Considered:  Viral URI with cough Vitals signs within normal range and stable throughout visit. Laboratory/imaging studies significant for: See above 27 year old female presents emergency department complaints of cough, nasal congestion, sore throat for the past few days.  Multiple family emergency been ill with similar symptoms with the past couple of weeks.  On exam, patient with appreciable wheezing bilateral lung fields with improved symptoms after albuterol inhaler was used.  Chest x-ray without signs of pneumonia.  Respiratory viral panel negative.  Suspect the patient symptoms likely secondary to viral URI.  Recommend symptomatic therapy as described in AVS.  Will place patient on short course of steroids given concern for possible  bronchitis given wheeze in the setting of URI in the sense of COPD/asthma.  Will recommend follow-up with primary care for reassessment of symptoms.  Treatment plan discussed at length with patient and she acknowledged understanding was agreeable to said plan.  Patient overall well-appearing, afebrile in no acute distress. Worrisome signs and symptoms were discussed with the patient, and the patient acknowledged understanding to return to the ED if noticed. Patient was stable upon discharge.          Final Clinical Impression(s) / ED Diagnoses Final diagnoses:  Viral URI with cough    Rx / DC Orders      Peter Garter, [place] 04/10/23 1105    Rondel Baton, MD 04/10/23 [phone]

## 2023-04-10 NOTE — ED Notes (Signed)
RT Note:  Patient educated on the use of an Albuterol Inhaler and using with a spacer

## 2023-04-10 NOTE — Discharge Instructions (Addendum)
As discussed, workup today overall reassuring.  Chest x-ray without signs of pneumonia.  You tested negative for COVID, flu, RSV.  Suspect your symptoms are likely secondary to upper respiratory viral infection.  Given that you have slight wheeze on exam, concerned that you have developed bronchitis.  Will treat you with continues with inhaler, steroids.  Will also recommend additional treatments in the form of cough suppressant to use as needed in the form of Tessalon Perles.  Recommend allergy medicine such as Zyrtec/Claritin/Allegra as well as nasal steroid spray such as Nasacort/Flonase for treatment of your symptoms.  Please do not hesitate to return to emergency department for worrisome signs and symptoms we discussed become apparent.

## 2023-04-10 NOTE — ED Triage Notes (Signed)
Pt c/o sore throat with +chest congestion, productive cough.  Denies known fever at home.

## 2023-04-10 NOTE — ED Notes (Signed)
Discharge paperwork given and verbally understood. 

## 2023-05-04 ENCOUNTER — Emergency Department (HOSPITAL_BASED_OUTPATIENT_CLINIC_OR_DEPARTMENT_OTHER)
Admission: EM | Admit: 2023-05-04 | Discharge: 2023-05-04 | Disposition: A | Discharge status: Home / Self Care | Payer: Medicaid Other | Attending: Emergency Medicine | Admitting: Emergency Medicine

## 2023-05-04 ENCOUNTER — Encounter (HOSPITAL_BASED_OUTPATIENT_CLINIC_OR_DEPARTMENT_OTHER): Payer: Self-pay | Admitting: Emergency Medicine

## 2023-05-04 ENCOUNTER — Other Ambulatory Visit: Payer: Self-pay

## 2023-05-04 DIAGNOSIS — J019 Acute sinusitis, unspecified: Secondary | ICD-10-CM | POA: Insufficient documentation

## 2023-05-04 DIAGNOSIS — Z20822 Contact with and (suspected) exposure to covid-19: Secondary | ICD-10-CM | POA: Insufficient documentation

## 2023-05-04 DIAGNOSIS — R0981 Nasal congestion: Secondary | ICD-10-CM | POA: Diagnosis present

## 2023-05-04 DIAGNOSIS — J329 Chronic sinusitis, unspecified: Secondary | ICD-10-CM

## 2023-05-04 LAB — RESP PANEL BY RT-PCR (RSV, FLU A&B, COVID)  RVPGX2
Influenza A by PCR: NEGATIVE
Influenza B by PCR: NEGATIVE
Resp Syncytial Virus by PCR: NEGATIVE
SARS Coronavirus 2 by RT PCR: NEGATIVE

## 2023-05-04 MED ORDER — AMOXICILLIN-POT CLAVULANATE 875-125 MG PO TABS
1.0000 | ORAL_TABLET | Freq: Once | ORAL | Status: AC
  Administered 2023-05-04: 1 via ORAL
  Filled 2023-05-04: qty 1

## 2023-05-04 MED ORDER — KETOROLAC TROMETHAMINE 30 MG/ML IJ SOLN
30.0000 mg | Freq: Once | INTRAMUSCULAR | Status: AC
  Administered 2023-05-04: 30 mg via INTRAMUSCULAR
  Filled 2023-05-04: qty 1

## 2023-05-04 MED ORDER — PROCHLORPERAZINE MALEATE 10 MG PO TABS
10.0000 mg | ORAL_TABLET | Freq: Once | ORAL | Status: AC
  Administered 2023-05-04: 10 mg via ORAL
  Filled 2023-05-04: qty 1

## 2023-05-04 MED ORDER — AMOXICILLIN-POT CLAVULANATE 875-125 MG PO TABS: 1.0000 | ORAL_TABLET | Freq: Two times a day (BID) | ORAL | 0 refills | Status: DC

## 2023-05-04 MED ORDER — AMOXICILLIN-POT CLAVULANATE 875-125 MG PO TABS: 1.0000 | ORAL_TABLET | Freq: Two times a day (BID) | ORAL | 0 refills | Status: AC

## 2023-05-04 NOTE — ED Notes (Signed)
Pt

## 2023-05-04 NOTE — ED Notes (Signed)
In to check on pt. And found her sleeping soundly

## 2023-05-04 NOTE — ED Provider Notes (Signed)
[facility] AT [facility] Provider Note   CSN: [id number] Arrival date & time: 05/04/23  1633     History  Chief Complaint  Patient presents with   URI    Cassandra [facility] is a 27 y.o. female presents today with cough, congestion, rhinorrhea, and postnasal drip x 10 days.  Patient was recently treated for bronchitis with no improvement.  Patient also endorses a history of migraine which she feels one starting since being in the lobby.  Patient endorses mild nausea without vomiting.  Patient denies fever, chills, shortness of breath, diarrhea, abdominal pain, or ear pain.   URI Presenting symptoms: congestion, cough and rhinorrhea        Home Medications Prior to Admission medications   Medication Sig Start Date End Date Taking? Authorizing Provider  amoxicillin-clavulanate (AUGMENTIN) 875-125 MG tablet Take 1 tablet by mouth every 12 (twelve) hours. 05/04/23  Yes Dolphus Jenny, PA-C  benzonatate (TESSALON) 100 MG capsule Take 1 capsule (100 mg total) by mouth 3 (three) times daily as needed. 04/10/23   Peter Garter, PA  buPROPion (WELLBUTRIN XL) 150 MG 24 hr tablet Take 150 mg by mouth daily.    [provider]  cetirizine-pseudoephedrine (ZYRTEC-D) 5-120 MG tablet Take 1 tablet by mouth daily as needed for allergies. 04/10/23   Sherian Maroon A, PA  hydrOXYzine (ATARAX/VISTARIL) 10 MG tablet Take 10 mg by mouth 3 (three) times daily as needed.    [provider]  methylphenidate 18 MG PO CR tablet Take 18 mg by mouth daily.    [provider]  omeprazole (PRILOSEC) 40 MG capsule Take 40 mg by mouth daily. 12/05/21   [provider]  rizatriptan (MAXALT) 10 MG tablet Take 10 mg by mouth as needed for migraine. May repeat in 2 hours if needed    [provider]  triamcinolone (NASACORT) 55 MCG/ACT AERO nasal inhaler Place 2 sprays into the nose daily. 04/10/23   Peter Garter, PA       Allergies    Patient has no known allergies.    Review of Systems   Review of Systems  HENT:  Positive for congestion, postnasal drip, rhinorrhea and sinus pressure.   Respiratory:  Positive for cough.     Physical Exam Updated Vital Signs BP 133/71   Pulse 64   Temp 98.3 F (36.8 C) (Oral)   Resp 18   SpO2 99%  Physical Exam Vitals and nursing note reviewed.  Constitutional:      General: She is not in acute distress.    Appearance: Normal appearance. She is well-developed.  HENT:     Head: Normocephalic and atraumatic.     Right Ear: External ear normal.     Left Ear: External ear normal.     Nose: Congestion and rhinorrhea present.     Mouth/Throat:     Mouth: Mucous membranes are moist.     Pharynx: Posterior oropharyngeal erythema present. No oropharyngeal exudate.  Eyes:     Extraocular Movements: Extraocular movements intact.     Conjunctiva/sclera: Conjunctivae normal.     Pupils: Pupils are equal, round, and reactive to light.  Cardiovascular:     Rate and Rhythm: Normal rate and regular rhythm.     Pulses: Normal pulses.     Heart sounds: Normal heart sounds. No murmur heard. Pulmonary:     Effort: Pulmonary effort is normal. No respiratory distress.     Breath sounds: Normal breath sounds.  Abdominal:     Palpations: Abdomen is soft.     Tenderness: There is no abdominal tenderness.  Musculoskeletal:        General: No swelling.     Cervical back: Normal range of motion and neck supple.     Right lower leg: No edema.     Left lower leg: No edema.  Skin:    General: Skin is warm and dry.     Capillary Refill: Capillary refill takes less than 2 seconds.  Neurological:     General: No focal deficit present.     Mental Status: She is alert.     Motor: No weakness.  Psychiatric:        Mood and Affect: Mood normal.     ED Results / Procedures / Treatments   Labs (all labs ordered are listed, but only abnormal results are displayed) Labs  Reviewed  RESP PANEL BY RT-PCR (RSV, FLU A&B, COVID)  RVPGX2    EKG None  Radiology No results found.  Procedures Procedures    Medications Ordered in ED Medications  prochlorperazine (COMPAZINE) tablet 10 mg (10 mg Oral Given 05/04/23 2027)  ketorolac (TORADOL) 30 MG/ML injection 30 mg (30 mg Intramuscular Given 05/04/23 2028)  amoxicillin-clavulanate (AUGMENTIN) 875-125 MG per tablet 1 tablet (1 tablet Oral Given 05/04/23 2027)    ED Course/ Medical Decision Making/ A&P                                 Medical Decision Making Risk Prescription drug management.   This patient presents to the ED with chief complaint(s) of cough, congestion, rhinorrhea with pertinent past medical history of none which further complicates the presenting complaint. The complaint involves an extensive differential diagnosis and also carries with it a high risk of complications and morbidity.    The differential diagnosis includes sinusitis, COVID, flu, RSV  Additional history obtained: Records reviewed Primary Care Documents  ED Course and Reassessment:   Independent labs interpretation:  The following labs were independently interpreted:  Resp panel: Negative   Consultation: - Consulted or discussed management/test interpretation w/ external professional: None  Consideration for admission or further workup: Considered for admission or further workup however patient's vital signs, physical exam, and labs have all been reassuring.  Patient will be treated outpatient for sinusitis with course of Augmentin.  Patient states that her head is feeling better after Toradol and Compazine.  Patient should follow-up with PCP for further evaluation and treatment if symptoms persist.        Final Clinical Impression(s) / ED Diagnoses Final diagnoses:  Sinusitis, unspecified chronicity, unspecified location    Rx / DC Orders ED Discharge Orders          Ordered    amoxicillin-clavulanate  (AUGMENTIN) 875-125 MG tablet  Every 12 hours        05/04/23 2025              Dolphus Jenny, PA-C 05/04/23 2127    Royanne Foots, DO 05/12/23 1114

## 2023-05-04 NOTE — ED Triage Notes (Signed)
Cough and congestion x 10 days. Recently treated for bronchitis

## 2023-05-04 NOTE — ED Notes (Signed)
Pt. Also c/o migraine

## 2023-05-04 NOTE — Discharge Instructions (Addendum)
Today you were seen for sinusitis.  Please pick up your medication and take as prescribed.  Please follow-up with your PCP for further evaluation and treatment if your symptoms persist.  Thank you for letting us treat you today. After performing a physical exam and reviewing your labs, I feel you are safe to go home. Please follow up with your PCP in the next several days and provide them with your records from this visit. Return to the Emergency Room if pain becomes severe or symptoms worsen.

## 2023-05-25 IMAGING — DX DG CHEST 1V PORT
1 series · 1 of 1 positions shown · non-contrast
Comparison: Chest x-ray dated October 22, 2017.

CLINICAL DATA: Cough and sore throat.

EXAM:
PORTABLE CHEST 1 VIEW

[chest]
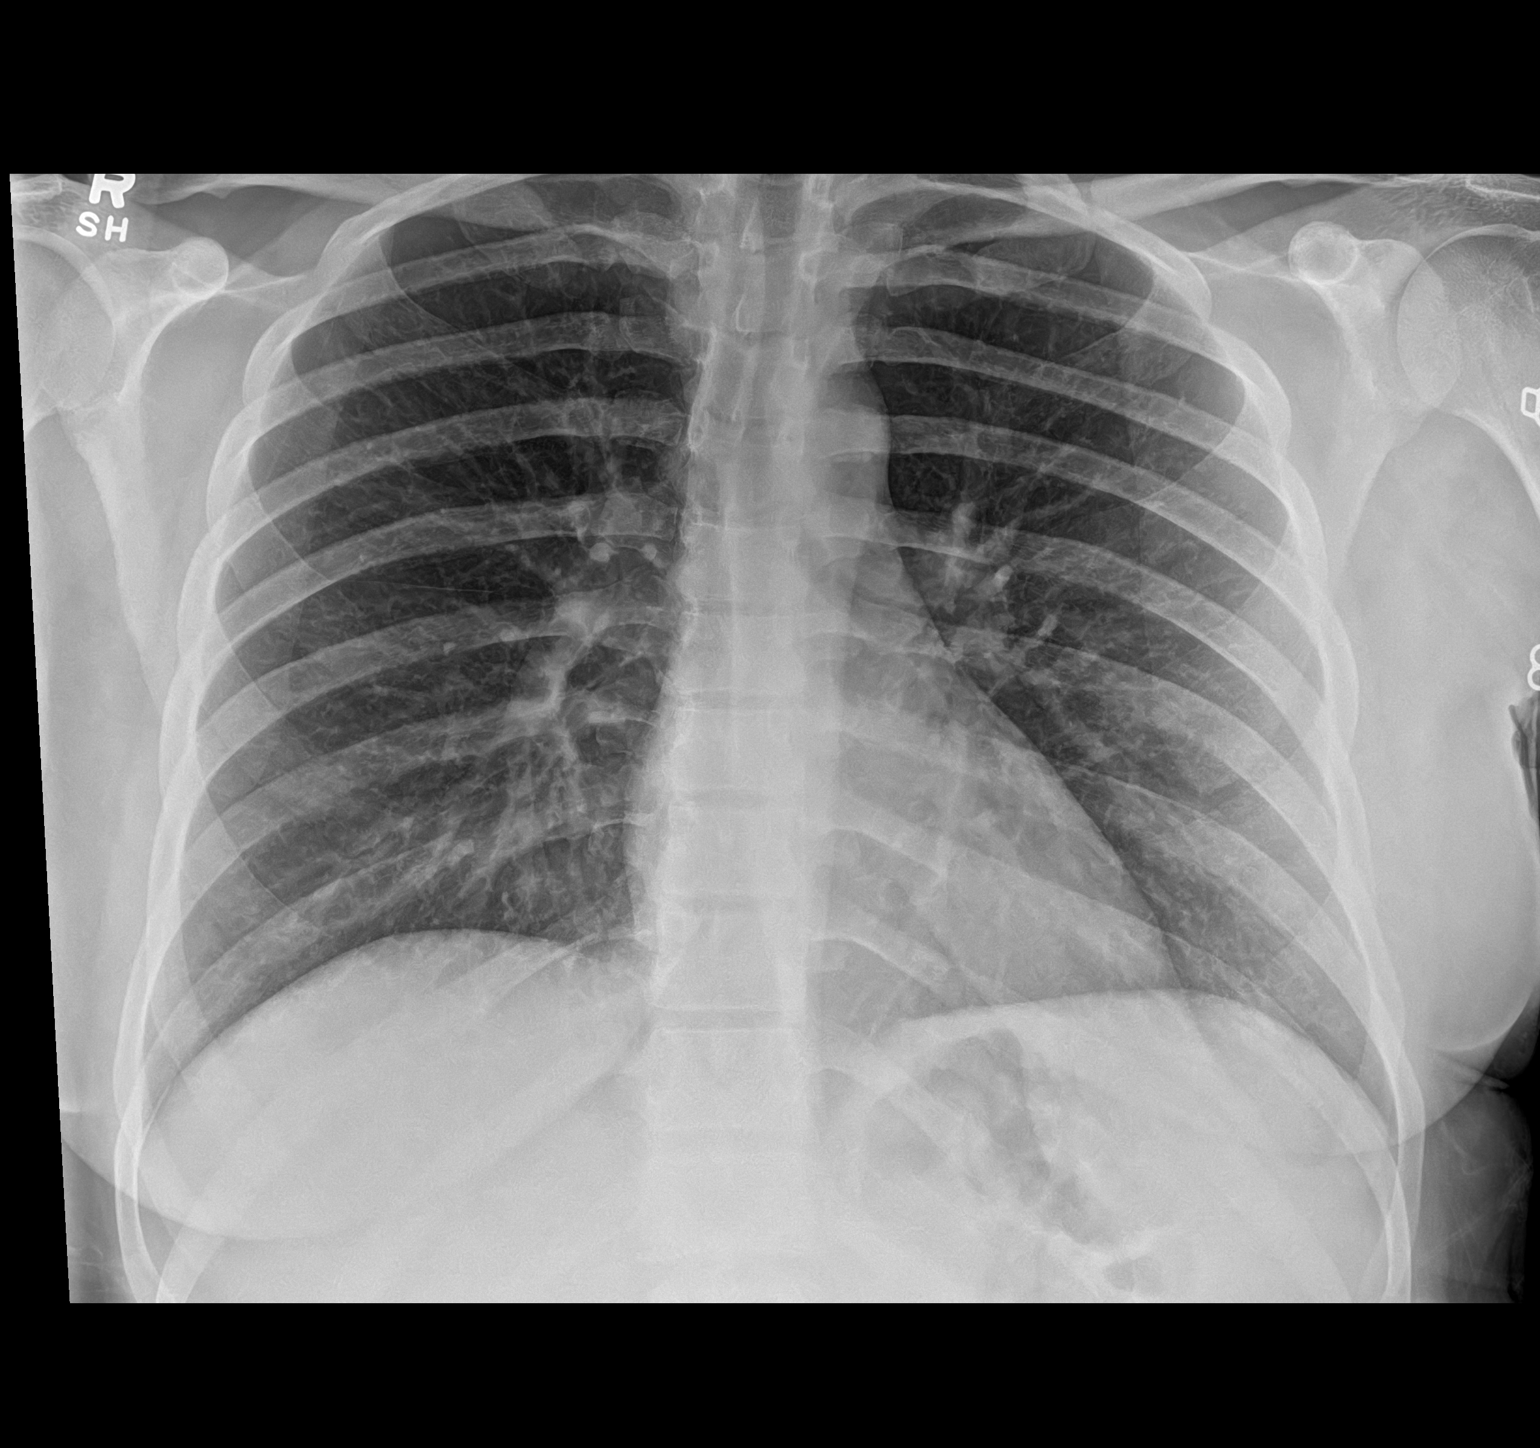

[1 of 1 positions shown; findings below may reference images not displayed]

FINDINGS: The heart size and mediastinal contours are within normal limits.
Both lungs are clear. The visualized skeletal structures are
unremarkable.
IMPRESSION: No active disease.

## 2023-06-15 ENCOUNTER — Emergency Department (HOSPITAL_BASED_OUTPATIENT_CLINIC_OR_DEPARTMENT_OTHER)
Admission: EM | Admit: 2023-06-15 | Discharge: 2023-06-15 | Disposition: A | Discharge status: Home / Self Care | Payer: Medicaid Other | Attending: Emergency Medicine | Admitting: Emergency Medicine

## 2023-06-15 ENCOUNTER — Other Ambulatory Visit: Payer: Self-pay

## 2023-06-15 ENCOUNTER — Encounter (HOSPITAL_BASED_OUTPATIENT_CLINIC_OR_DEPARTMENT_OTHER): Payer: Self-pay | Admitting: Emergency Medicine

## 2023-06-15 DIAGNOSIS — Z20822 Contact with and (suspected) exposure to covid-19: Secondary | ICD-10-CM | POA: Insufficient documentation

## 2023-06-15 DIAGNOSIS — J101 Influenza due to other identified influenza virus with other respiratory manifestations: Secondary | ICD-10-CM | POA: Insufficient documentation

## 2023-06-15 DIAGNOSIS — R059 Cough, unspecified: Secondary | ICD-10-CM | POA: Diagnosis present

## 2023-06-15 LAB — RESP PANEL BY RT-PCR (RSV, FLU A&B, COVID)  RVPGX2
Influenza A by PCR: POSITIVE — AB
Influenza B by PCR: NEGATIVE
Resp Syncytial Virus by PCR: NEGATIVE
SARS Coronavirus 2 by RT PCR: NEGATIVE

## 2023-06-15 MED ORDER — OXYCODONE-ACETAMINOPHEN 5-325 MG PO TABS
1.0000 | ORAL_TABLET | ORAL | Status: DC | PRN
  Administered 2023-06-15: 1 via ORAL
  Filled 2023-06-15: qty 1

## 2023-06-15 MED ORDER — BENZONATATE 100 MG PO CAPS: 100.0000 mg | ORAL_CAPSULE | Freq: Three times a day (TID) | ORAL | 0 refills | Status: AC

## 2023-06-15 MED ORDER — OSELTAMIVIR PHOSPHATE 75 MG PO CAPS: 75.0000 mg | ORAL_CAPSULE | Freq: Two times a day (BID) | ORAL | 0 refills | Status: AC

## 2023-06-15 MED ORDER — ONDANSETRON 4 MG PO TBDP: 4.0000 mg | ORAL_TABLET | Freq: Three times a day (TID) | ORAL | 0 refills | Status: DC | PRN

## 2023-06-15 NOTE — Discharge Instructions (Signed)
Please read and follow all provided instructions.  Your diagnoses today include:  1. Influenza A     Tests performed today include: Testing for flu, COVID, RSV: Positive for influenza  vital signs. See below for your results today.   Medications prescribed:  Tessalon Perles - cough suppressant medication  Zofran (ondansetron) - for nausea and vomiting  Tamiflu - medication for influenza  This medication, when taken within the first 48 hours of illness, may help decrease the severity of the flu and cause symptoms to improve approximately 12 hours sooner. About 1 out of 5 patients may have diarrhea and vomiting from this medication.   Take any prescribed medications only as directed.  Home care instructions:  Follow any educational materials contained in this packet. Please continue drinking plenty of fluids. Use over-the-counter cold and flu medications as needed as directed on packaging for symptom relief. You may also use ibuprofen or tylenol as directed on packaging for pain or fever.   BE VERY CAREFUL not to take multiple medicines containing Tylenol (also called acetaminophen). Doing so can lead to an overdose which can damage your liver and cause liver failure and possibly death.   Follow-up instructions: Please follow-up with your primary care provider in the next 3 days for further evaluation of your symptoms.   Return instructions:  Please return to the Emergency Department if you experience worsening symptoms. Please return if you have a high fever greater than 101 degrees not controlled with over-the-counter medications, persistent vomiting and cannot keep down fluids, or worsening trouble breathing. Please return if you have any other emergent concerns.  Additional Information:  Your vital signs today were: BP 105/85 (BP Location: Right Arm)   Pulse (!) 113   Temp 99.1 F (37.3 C) (Oral)   Resp 20   Wt 81.6 kg   SpO2 99%   BMI 32.92 kg/m  If your blood  pressure (BP) was elevated above 135/85 this visit, please have this repeated by your doctor within one month.

## 2023-06-15 NOTE — ED Provider Notes (Cosign Needed)
[place] EMERGENCY DEPARTMENT AT [facility] Provider Note   CSN: [id number] Arrival date & time: 06/15/23  1153     History  No chief complaint on file.   Cass[facility] is a 28 y.o. female.  Patient with no significant past medical history presents to the emergency department today for evaluation of flulike illness.  Patient started having cough 2 nights ago.  Her daughter became sick just prior with fever and cough.  Patient reports increasing body aches yesterday.  She has had a sore throat as well.  No documented fevers.  She has been taking over-the-counter medications.  No nausea, vomiting, or diarrhea.       Home Medications Prior to Admission medications   Medication Sig Start Date End Date Taking? Authorizing Provider  benzonatate (TESSALON) 100 MG capsule Take 1 capsule (100 mg total) by mouth every 8 (eight) hours. 06/15/23  Yes Renne Crigler, PA-C  ondansetron (ZOFRAN-ODT) 4 MG disintegrating tablet Take 1 tablet (4 mg total) by mouth every 8 (eight) hours as needed for nausea or vomiting. 06/15/23  Yes Renne Crigler, PA-C  oseltamivir (TAMIFLU) 75 MG capsule Take 1 capsule (75 mg total) by mouth every 12 (twelve) hours. 06/15/23  Yes Renne Crigler, PA-C  amoxicillin-clavulanate (AUGMENTIN) 875-125 MG tablet Take 1 tablet by mouth every 12 (twelve) hours. 05/04/23   Dolphus Jenny, PA-C  buPROPion (WELLBUTRIN XL) 150 MG 24 hr tablet Take 150 mg by mouth daily.    [provider]  cetirizine-pseudoephedrine (ZYRTEC-D) 5-120 MG tablet Take 1 tablet by mouth daily as needed for allergies. 04/10/23   Sherian Maroon A, PA  hydrOXYzine (ATARAX/VISTARIL) 10 MG tablet Take 10 mg by mouth 3 (three) times daily as needed.    [provider]  methylphenidate 18 MG PO CR tablet Take 18 mg by mouth daily.    [provider]  omeprazole (PRILOSEC) 40 MG capsule Take 40 mg by mouth daily. 12/05/21   [provider]  rizatriptan (MAXALT) 10  MG tablet Take 10 mg by mouth as needed for migraine. May repeat in 2 hours if needed    [provider]  triamcinolone (NASACORT) 55 MCG/ACT AERO nasal inhaler Place 2 sprays into the nose daily. 04/10/23   Peter Garter, PA      Allergies    Patient has no known allergies.    Review of Systems   Review of Systems  Physical Exam Updated Vital Signs BP 105/85 (BP Location: Right Arm)   Pulse (!) 113   Temp 99.1 F (37.3 C) (Oral)   Resp 20   Wt 81.6 kg   SpO2 99%   BMI 32.92 kg/m   Physical Exam Vitals and nursing note reviewed.  Constitutional:      Appearance: She is well-developed.  HENT:     Head: Normocephalic and atraumatic.     Jaw: No trismus.     Right Ear: Tympanic membrane, ear canal and external ear normal.     Left Ear: Tympanic membrane, ear canal and external ear normal.     Nose: Congestion present. No mucosal edema or rhinorrhea.     Mouth/Throat:     Mouth: Mucous membranes are moist. Mucous membranes are not dry. No oral lesions.     Pharynx: Uvula midline. No oropharyngeal exudate, posterior oropharyngeal erythema or uvula swelling.     Tonsils: No tonsillar abscesses.  Eyes:     General:        Right eye: No discharge.  Left eye: No discharge.     Conjunctiva/sclera: Conjunctivae normal.  Cardiovascular:     Rate and Rhythm: Regular rhythm. Tachycardia present.     Heart sounds: Normal heart sounds.  Pulmonary:     Effort: Pulmonary effort is normal. No respiratory distress.     Breath sounds: Normal breath sounds. No wheezing or rales.     Comments: No respiratory distress or increased work of breathing Abdominal:     Palpations: Abdomen is soft.     Tenderness: There is no abdominal tenderness.  Musculoskeletal:     Cervical back: Normal range of motion and neck supple.  Lymphadenopathy:     Cervical: No cervical adenopathy.  Skin:    General: Skin is warm and dry.  Neurological:     Mental Status: She is alert.   Psychiatric:        Mood and Affect: Mood normal.     ED Results / Procedures / Treatments   Labs (all labs ordered are listed, but only abnormal results are displayed) Labs Reviewed  RESP PANEL BY RT-PCR (RSV, FLU A&B, COVID)  RVPGX2 - Abnormal; Notable for the following components:      Result Value   Influenza A by PCR POSITIVE (*)    All other components within normal limits    EKG None  Radiology No results found.  Procedures Procedures    Medications Ordered in ED Medications  oxyCODONE-acetaminophen (PERCOCET/ROXICET) 5-325 MG per tablet 1 tablet (1 tablet Oral Given 06/15/23 1214)    ED Course/ Medical Decision Making/ A&P    Patient seen and examined. History obtained directly from patient. Work-up including labs, imaging, EKG ordered in triage, if performed, were reviewed.    Labs/EKG: Independently reviewed and interpreted.  This included: Viral panel positive for influenza  Imaging: None ordered  Medications/Fluids: None ordered  Most recent vital signs reviewed and are as follows: BP 105/85 (BP Location: Right Arm)   Pulse (!) 113   Temp 99.1 F (37.3 C) (Oral)   Resp 20   Wt 81.6 kg   SpO2 99%   BMI 32.92 kg/m   Initial impression: Influenza A  Home treatment plan: Rest, hydration, discussed Tamiflu, patient would like this prescribed.  We discussed risks and benefits.  Also will give Zofran and Tessalon for symptom control.  Encouraged use of over-the-counter medications as desired.  Return instructions discussed with patient: Return with increased work of breathing, persistent vomiting, new or worsening symptoms  Follow-up instructions discussed with patient: PCP in 5 days if not improving                                Medical Decision Making Risk Prescription drug management.   Patient with symptoms consistent with influenza. Vitals are stable, no documented fevers. No signs of dehydration, tolerating PO's. Lungs are clear.  Supportive therapy indicated with return if symptoms worsen.  Low concern for pneumonia at this time.        Final Clinical Impression(s) / ED Diagnoses Final diagnoses:  Influenza A    Rx / DC Orders ED Discharge Orders          Ordered    ondansetron (ZOFRAN-ODT) 4 MG disintegrating tablet  Every 8 hours PRN        06/15/23 1327    benzonatate (TESSALON) 100 MG capsule  Every 8 hours        06/15/23 1327    oseltamivir (  TAMIFLU) 75 MG capsule  Every 12 hours        06/15/23 1327              Renne Crigler, [place] 06/15/23 1331

## 2023-06-15 NOTE — ED Triage Notes (Signed)
Cough/congested Friday, today chest is sore from coughing, can't stop coughing. No fevers

## 2023-06-28 ENCOUNTER — Emergency Department (HOSPITAL_BASED_OUTPATIENT_CLINIC_OR_DEPARTMENT_OTHER): Payer: Medicaid Other

## 2023-06-28 ENCOUNTER — Encounter (HOSPITAL_BASED_OUTPATIENT_CLINIC_OR_DEPARTMENT_OTHER): Payer: Self-pay

## 2023-06-28 ENCOUNTER — Emergency Department (HOSPITAL_BASED_OUTPATIENT_CLINIC_OR_DEPARTMENT_OTHER)
Admission: EM | Admit: 2023-06-28 | Discharge: 2023-06-28 | Disposition: A | Discharge status: Home / Self Care | Payer: Medicaid Other | Attending: Emergency Medicine | Admitting: Emergency Medicine

## 2023-06-28 DIAGNOSIS — K8020 Calculus of gallbladder without cholecystitis without obstruction: Secondary | ICD-10-CM | POA: Diagnosis not present

## 2023-06-28 DIAGNOSIS — R109 Unspecified abdominal pain: Secondary | ICD-10-CM | POA: Diagnosis present

## 2023-06-28 LAB — URINALYSIS, ROUTINE W REFLEX MICROSCOPIC
Bacteria, UA: NONE SEEN
Bilirubin Urine: NEGATIVE
Glucose, UA: NEGATIVE mg/dL
Hgb urine dipstick: NEGATIVE
Ketones, ur: NEGATIVE mg/dL
Nitrite: NEGATIVE
Protein, ur: NEGATIVE mg/dL
Specific Gravity, Urine: 1.02 (ref 1.005–1.030)
pH: 7 (ref 5.0–8.0)

## 2023-06-28 LAB — CBC WITH DIFFERENTIAL/PLATELET
Abs Immature Granulocytes: 0.05 10*3/uL (ref 0.00–0.07)
Basophils Absolute: 0.1 10*3/uL (ref 0.0–0.1)
Basophils Relative: 1 %
Eosinophils Absolute: 0.3 10*3/uL (ref 0.0–0.5)
Eosinophils Relative: 3 %
HCT: 39.6 % (ref 36.0–46.0)
Hemoglobin: 13.1 g/dL (ref 12.0–15.0)
Immature Granulocytes: 1 %
Lymphocytes Relative: 23 %
Lymphs Abs: 2.3 10*3/uL (ref 0.7–4.0)
MCH: 27.3 pg (ref 26.0–34.0)
MCHC: 33.1 g/dL (ref 30.0–36.0)
MCV: 82.5 fL (ref 80.0–100.0)
Monocytes Absolute: 0.9 10*3/uL (ref 0.1–1.0)
Monocytes Relative: 9 %
Neutro Abs: 6.6 10*3/uL (ref 1.7–7.7)
Neutrophils Relative %: 63 %
Platelets: 432 10*3/uL — ABNORMAL HIGH (ref 150–400)
RBC: 4.8 MIL/uL (ref 3.87–5.11)
RDW: 12.2 % (ref 11.5–15.5)
WBC: 10.1 10*3/uL (ref 4.0–10.5)
nRBC: 0 % (ref 0.0–0.2)

## 2023-06-28 LAB — COMPREHENSIVE METABOLIC PANEL
ALT: 26 U/L (ref 0–44)
AST: 12 U/L — ABNORMAL LOW (ref 15–41)
Albumin: 4.1 g/dL (ref 3.5–5.0)
Alkaline Phosphatase: 61 U/L (ref 38–126)
Anion gap: 6 (ref 5–15)
BUN: 12 mg/dL (ref 6–20)
CO2: 29 mmol/L (ref 22–32)
Calcium: 9.2 mg/dL (ref 8.9–10.3)
Chloride: 105 mmol/L (ref 98–111)
Creatinine, Ser: 0.57 mg/dL (ref 0.44–1.00)
GFR, Estimated: >60 mL/min (ref >60)
Glucose, Bld: 94 mg/dL (ref 70–99)
Potassium: 4.1 mmol/L (ref 3.5–5.1)
Sodium: 140 mmol/L (ref 135–145)
Total Bilirubin: 0.4 mg/dL (ref 0.0–1.2)
Total Protein: 6.7 g/dL (ref 6.5–8.1)

## 2023-06-28 LAB — PREGNANCY, URINE: Preg Test, Ur: NEGATIVE

## 2023-06-28 MED ORDER — METHOCARBAMOL 500 MG PO TABS: 500.0000 mg | ORAL_TABLET | Freq: Two times a day (BID) | ORAL | 0 refills | Status: AC

## 2023-06-28 MED ORDER — METHOCARBAMOL 500 MG PO TABS: 500.0000 mg | ORAL_TABLET | Freq: Two times a day (BID) | ORAL | 0 refills | Status: DC

## 2023-06-28 NOTE — ED Triage Notes (Signed)
She c/o right flank pain x 2-3 days. She denies fever, and is in no distress.

## 2023-06-28 NOTE — ED Provider Notes (Signed)
[place] EMERGENCY DEPARTMENT AT [facility] Provider Note   CSN: [id number] Arrival date & time: 06/28/23  1440     History  No chief complaint on file.   Cassandra Hill is a 28 y.o. female who presents with right flank pain over the past few days.  Denies any injury or trauma.  No history of kidney stones.  No urinary symptoms.  Patient comes and goes.  Not associated with certain movements.  Denies any rashes.  No numbness or tingling.  HPI     Home Medications Prior to Admission medications   Medication Sig Start Date End Date Taking? Authorizing Provider  methocarbamol (ROBAXIN) 500 MG tablet Take 1 tablet (500 mg total) by mouth 2 (two) times daily. 06/28/23  Yes Halford Decamp, PA-C  amoxicillin-clavulanate (AUGMENTIN) 875-125 MG tablet Take 1 tablet by mouth every 12 (twelve) hours. 05/04/23   Dolphus Jenny, PA-C  benzonatate (TESSALON) 100 MG capsule Take 1 capsule (100 mg total) by mouth every 8 (eight) hours. 06/15/23   Renne Crigler, PA-C  buPROPion (WELLBUTRIN XL) 150 MG 24 hr tablet Take 150 mg by mouth daily.    [provider]  cetirizine-pseudoephedrine (ZYRTEC-D) 5-120 MG tablet Take 1 tablet by mouth daily as needed for allergies. 04/10/23   Sherian Maroon A, PA  hydrOXYzine (ATARAX/VISTARIL) 10 MG tablet Take 10 mg by mouth 3 (three) times daily as needed.    [provider]  methylphenidate 18 MG PO CR tablet Take 18 mg by mouth daily.    [provider]  omeprazole (PRILOSEC) 40 MG capsule Take 40 mg by mouth daily. 12/05/21   [provider]  ondansetron (ZOFRAN-ODT) 4 MG disintegrating tablet Take 1 tablet (4 mg total) by mouth every 8 (eight) hours as needed for nausea or vomiting. 06/15/23   Renne Crigler, PA-C  oseltamivir (TAMIFLU) 75 MG capsule Take 1 capsule (75 mg total) by mouth every 12 (twelve) hours. 06/15/23   Renne Crigler, PA-C  rizatriptan (MAXALT) 10 MG tablet Take 10 mg by mouth as needed for  migraine. May repeat in 2 hours if needed    [provider]  triamcinolone (NASACORT) 55 MCG/ACT AERO nasal inhaler Place 2 sprays into the nose daily. 04/10/23   Peter Garter, PA      Allergies    Patient has no known allergies.    Review of Systems   Review of Systems  Musculoskeletal:  Positive for myalgias.    Physical Exam Updated Vital Signs BP 132/72 (BP Location: Right Arm)   Pulse 73   Temp (!) 97 F (36.1 C)   Resp 18   SpO2 99%  Physical Exam Vitals and nursing note reviewed.  Constitutional:      General: She is not in acute distress.    Appearance: She is well-developed.  HENT:     Head: Normocephalic and atraumatic.  Eyes:     Conjunctiva/sclera: Conjunctivae normal.  Cardiovascular:     Rate and Rhythm: Normal rate and regular rhythm.     Heart sounds: No murmur heard. Pulmonary:     Effort: Pulmonary effort is normal. No respiratory distress.     Breath sounds: Normal breath sounds.  Abdominal:     Palpations: Abdomen is soft.     Tenderness: There is no abdominal tenderness. There is right CVA tenderness. There is no left CVA tenderness.  Musculoskeletal:        General: No swelling.     Cervical back: Neck  supple.     Comments: Tender right paraspinal region abdomen wrapping around her flank  Skin:    General: Skin is warm and dry.     Capillary Refill: Capillary refill takes less than 2 seconds.  Neurological:     Mental Status: She is alert.  Psychiatric:        Mood and Affect: Mood normal.     ED Results / Procedures / Treatments   Labs (all labs ordered are listed, but only abnormal results are displayed) Labs Reviewed  URINALYSIS, ROUTINE W REFLEX MICROSCOPIC - Abnormal; Notable for the following components:      Result Value   Leukocytes,Ua TRACE (*)    All other components within normal limits  CBC WITH DIFFERENTIAL/PLATELET - Abnormal; Notable for the following components:   Platelets 432 (*)    All other  components within normal limits  COMPREHENSIVE METABOLIC PANEL - Abnormal; Notable for the following components:   AST 12 (*)    All other components within normal limits  PREGNANCY, URINE    EKG None  Radiology CT Renal Stone Study Result Date: 06/28/2023 CLINICAL DATA:  Right flank pain EXAM: CT ABDOMEN AND PELVIS WITHOUT CONTRAST TECHNIQUE: Multidetector CT imaging of the abdomen and pelvis was performed following the standard protocol without IV contrast. RADIATION DOSE REDUCTION: This exam was performed according to the departmental dose-optimization program which includes automated exposure control, adjustment of the mA and/or kV according to patient size and/or use of iterative reconstruction technique. COMPARISON:  06/21/2022 FINDINGS: Lower chest: Included lung bases are clear.  Heart size is normal. Hepatobiliary: Unremarkable unenhanced appearance of the liver. No focal liver lesion identified. Multiple stones fill the gallbladder. No pericholecystic inflammatory changes by CT. No biliary dilatation. Pancreas: Unremarkable. No pancreatic ductal dilatation or surrounding inflammatory changes. Spleen: Normal in size without focal abnormality. Adrenals/Urinary Tract: Adrenal glands are unremarkable. Kidneys are normal, without renal calculi, focal lesion, or hydronephrosis. No ureteral calculi. Bladder is unremarkable. Stomach/Bowel: Stomach is within normal limits. Appendix appears normal (series 2, image 58). No evidence of bowel wall thickening, distention, or inflammatory changes. Vascular/Lymphatic: No significant vascular findings are present. No enlarged abdominal or pelvic lymph nodes. Reproductive: Anteverted uterus containing IUD. Unremarkable right adnexa. 4.4 cm simple left adnexal cyst. No follow-up imaging recommended. Note: This recommendation does not apply to premenarchal patients and to those with increased risk (genetic, family history, elevated tumor markers or other high-risk  factors) of ovarian cancer. Reference: JACR 2020 Feb; 17(2):248-254 Other: No free fluid. No abdominopelvic fluid collection. No pneumoperitoneum. No abdominal wall hernia. 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 No acute osseous abnormality. IMPRESSION: 1. No acute abdominopelvic findings. Specifically, no evidence of obstructive uropathy. 2. Cholelithiasis without evidence of acute cholecystitis. 3. 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 Electronically Signed   By: Duanne Guess D.O.   On: 06/28/2023 18:36    Procedures Procedures    Medications Ordered in ED Medications - No data to display  ED Course/ Medical Decision Making/ A&P                                 Medical Decision Making Amount and/or Complexity of Data Reviewed Labs: ordered.   This patient presents to the ED with chief complaint(s) of flank pain.  The complaint involves an extensive differential diagnosis and also  carries with it a high risk of complications and morbidity.   pertinent past medical history as listed in HPI  The differential diagnosis includes  gastroenteritis, colitis, small bowel obstruction, appendicitis, cholecystitis, hepatobiliary pathology, gastritis, PUD, ACS, aortic dissection, diverticulosis/diverticulitis, pancreatitis, nephrolithiasis, AAA, UTI, pyelonephritis, musculoskeletal  Additional history obtained:  Records reviewed previous admission documents and Care Everywhere/External Records  Initial Assessment:   Hemodynamically stable, afebrile, nontoxic-appearing patient presenting with right flank pain over the past few days.  She has no urinary symptoms.  No history of kidney stones.  Pain does not seem to be associated with certain movements.  On exam she does have positive CVAT, no abdominal tenderness.  No numbness or tingling to suggest radicular  pathology.  Independent ECG interpretation:  none  Independent labs interpretation:  The following labs were independently interpreted:  CMP and CBC without significant abnormality, UA without nitrates, trace leukocytes present, hCG negative  Independent visualization and interpretation of imaging: I independently visualized the following imaging with scope of interpretation limited to determining acute life threatening conditions related to emergency care: CT renal study, which revealed no nephrolithiasis present.  There is cholelithiasis without evidence of cholecystitis.  Treatment and Reassessment: No medications administered during visit  Discussed CT scan findings with patient.  Etiology likely musculoskeletal.  Offered referral for orthopedics.  She declines she is interested and muscle relaxants.  She has known cholelithiasis.  Redemonstrated again on CT today.  Will provide general surgery referral. Consultations obtained:   none  Disposition:   Patient will be discharged home.  Encouraged to follow-up with primary care doctor.  Social Determinants of Health:   none  This note was dictated with voice recognition software.  Despite best efforts at proofreading, errors may have occurred which can change the documentation meaning.          Final Clinical Impression(s) / ED Diagnoses Final diagnoses:  Right flank pain  Calculus of gallbladder without cholecystitis without obstruction    Rx / DC Orders ED Discharge Orders          Ordered    methocarbamol (ROBAXIN) 500 MG tablet  2 times daily        06/28/23 1908              Fabienne Bruns 06/28/23 1909    Terald Sleeper, MD 06/28/23 1943

## 2023-06-28 NOTE — Discharge Instructions (Signed)
You were evaluated in the emergency room for flank pain.  Your lab work did not show any significant abnormality.  Your CT scan did not show any infection of your kidneys or kidney stones present.  It did show again that you have gallstones.  You have been provided a referral to see a Development worker, international aid.  A prescription for muscle relaxants has been sent into your pharmacy.  Please avoid driving or operating heavy missionary as this can cause drowsiness.  If you experience any new or worsening symptoms including persistent fevers and vomiting please return to the emergency room.

## 2023-07-11 NOTE — Addendum Note (Signed)
 Encounter addended by: Denyce Robert, NP on: 07/11/2023 10:33 AM  Actions taken: Letter saved

## 2023-10-16 ENCOUNTER — Encounter (HOSPITAL_BASED_OUTPATIENT_CLINIC_OR_DEPARTMENT_OTHER): Payer: Self-pay | Admitting: Emergency Medicine

## 2023-10-16 ENCOUNTER — Other Ambulatory Visit: Payer: Self-pay

## 2023-10-16 ENCOUNTER — Emergency Department (HOSPITAL_BASED_OUTPATIENT_CLINIC_OR_DEPARTMENT_OTHER)
Admission: EM | Admit: 2023-10-16 | Discharge: 2023-10-16 | Disposition: A | Discharge status: Home / Self Care | Attending: Emergency Medicine | Admitting: Emergency Medicine

## 2023-10-16 DIAGNOSIS — R059 Cough, unspecified: Secondary | ICD-10-CM | POA: Diagnosis present

## 2023-10-16 DIAGNOSIS — R0981 Nasal congestion: Secondary | ICD-10-CM | POA: Diagnosis not present

## 2023-10-16 DIAGNOSIS — R0982 Postnasal drip: Secondary | ICD-10-CM | POA: Diagnosis not present

## 2023-10-16 LAB — RESP PANEL BY RT-PCR (RSV, FLU A&B, COVID)  RVPGX2
Influenza A by PCR: NEGATIVE
Influenza B by PCR: NEGATIVE
Resp Syncytial Virus by PCR: NEGATIVE
SARS Coronavirus 2 by RT PCR: NEGATIVE

## 2023-10-16 MED ORDER — OXYMETAZOLINE HCL 0.05 % NA SOLN: 1.0000 | Freq: Two times a day (BID) | NASAL | 0 refills | Status: AC

## 2023-10-16 MED ORDER — DOXYCYCLINE HYCLATE 100 MG PO CAPS: 100.0000 mg | ORAL_CAPSULE | Freq: Two times a day (BID) | ORAL | 0 refills | Status: AC

## 2023-10-16 MED ORDER — DOXYCYCLINE HYCLATE 100 MG PO CAPS: 100.0000 mg | ORAL_CAPSULE | Freq: Two times a day (BID) | ORAL | 0 refills | Status: DC

## 2023-10-16 MED ORDER — GUAIFENESIN ER 600 MG PO TB12: 1200.0000 mg | ORAL_TABLET | Freq: Two times a day (BID) | ORAL | 0 refills | Status: DC

## 2023-10-16 MED ORDER — GUAIFENESIN 100 MG/5ML PO LIQD
5.0000 mL | Freq: Once | ORAL | Status: AC
  Administered 2023-10-16: 5 mL via ORAL
  Filled 2023-10-16: qty 10

## 2023-10-16 MED ORDER — GUAIFENESIN ER 600 MG PO TB12: 1200.0000 mg | ORAL_TABLET | Freq: Two times a day (BID) | ORAL | 0 refills | Status: AC

## 2023-10-16 MED ORDER — OXYMETAZOLINE HCL 0.05 % NA SOLN
1.0000 | Freq: Once | NASAL | Status: AC
  Administered 2023-10-16: 1 via NASAL
  Filled 2023-10-16: qty 30

## 2023-10-16 NOTE — ED Notes (Signed)
 Pt given discharge instructions and reviewed prescriptions. Opportunities given for questions. Pt verbalizes understanding. Jillyn Hidden, RN

## 2023-10-16 NOTE — ED Triage Notes (Signed)
 Pt caox4 reporting for approx 3-4 days congestion, worsened productive cough, sore throat. Denies fever.

## 2023-10-16 NOTE — Discharge Instructions (Addendum)
 You have a lot of congestion from your sinuses that is likely triggering your cough.  I do not see obvious signs of a bacterial infection at this time.  I suspect this is a virus that will likely get better over the next 2 or 3 days.  I do recommend that you use Afrin for the next 3 days, spraying once in each nostril in the morning and at night, to help decongest your nose.  Do NOT use afrin for more than 3 days.  You can also use a Nettie pot, hot steam baths, to help decongest your nose.  You can also take Mucinex , which is an over-the-counter medicine, to help thin your mucus.  This may help with your coughing.  If you do begin having fevers (temperature over 100.14F), or your symptoms are not improving in 1 week, you can begin taking the doxycycline  antibiotic that I prescribed.

## 2023-10-16 NOTE — ED Provider Notes (Signed)
 [place] EMERGENCY DEPARTMENT AT [facility] Provider Note   CSN: [id number] Arrival date & time: 10/16/23  0736     History  Chief Complaint  Patient presents with   Cough    Cassandra Hill is a 28 y.o. female presenting today with cough and congestion for 3 to 4 days.  Patient reports her sinuses are stuffed up.  She denies fevers, vomiting, diarrhea.  She has a mild headache but suffers from this chronically.  She reports that there was a 45-month-old that she lives with that had viral type symptoms last week.  Patient works in a school.  HPI     Home Medications Prior to Admission medications   Medication Sig Start Date End Date Taking? Authorizing Provider  doxycycline  (VIBRAMYCIN ) 100 MG capsule Take 1 capsule (100 mg total) by mouth 2 (two) times daily for 7 days. 10/16/23 10/23/23 Yes Clarann Helvey, Janalyn Me, MD  guaiFENesin  (MUCINEX ) 600 MG 12 hr tablet Take 2 tablets (1,200 mg total) by mouth 2 (two) times daily for 7 days. 10/16/23 10/23/23 Yes Aliena Ghrist, Janalyn Me, MD  oxymetazoline (AFRIN NASAL SPRAY) 0.05 % nasal spray Place 1 spray into both nostrils 2 (two) times daily for 3 days. 10/16/23 10/19/23 Yes Destony Prevost, Janalyn Me, MD  amoxicillin -clavulanate (AUGMENTIN ) 875-125 MG tablet Take 1 tablet by mouth every 12 (twelve) hours. 05/04/23   Keith, Kayla N, PA-C  benzonatate  (TESSALON ) 100 MG capsule Take 1 capsule (100 mg total) by mouth every 8 (eight) hours. 06/15/23   Geiple, Joshua, PA-C  buPROPion (WELLBUTRIN XL) 150 MG 24 hr tablet Take 150 mg by mouth daily.    [provider]  cetirizine -pseudoephedrine  (ZYRTEC -D) 5-120 MG tablet Take 1 tablet by mouth daily as needed for allergies. 04/10/23   Neil Balls A, PA  hydrOXYzine (ATARAX/VISTARIL) 10 MG tablet Take 10 mg by mouth 3 (three) times daily as needed.    [provider]  methocarbamol  (ROBAXIN ) 500 MG tablet Take 1 tablet (500 mg total) by mouth 2 (two) times daily. 06/28/23   Felicie Horning,  PA-C  methylphenidate 18 MG PO CR tablet Take 18 mg by mouth daily.    [provider]  omeprazole (PRILOSEC) 40 MG capsule Take 40 mg by mouth daily. 12/05/21   [provider]  ondansetron  (ZOFRAN -ODT) 4 MG disintegrating tablet Take 1 tablet (4 mg total) by mouth every 8 (eight) hours as needed for nausea or vomiting. 06/15/23   Geiple, Joshua, PA-C  oseltamivir  (TAMIFLU ) 75 MG capsule Take 1 capsule (75 mg total) by mouth every 12 (twelve) hours. 06/15/23   Lyna Sandhoff, PA-C  rizatriptan (MAXALT) 10 MG tablet Take 10 mg by mouth as needed for migraine. May repeat in 2 hours if needed    [provider]  triamcinolone  (NASACORT ) 55 MCG/ACT AERO nasal inhaler Place 2 sprays into the nose daily. 04/10/23   [place] Butter, PA      Allergies    Patient has no known allergies.    Review of Systems   Review of Systems  Physical Exam Updated Vital Signs BP 136/85 (BP Location: Left Arm)   Pulse 99   Temp 98.1 F (36.7 C) (Oral)   Resp 17   Ht 5\' 4"  (1.626 m)   Wt 82.6 kg   SpO2 99%   BMI 31.24 kg/m  Physical Exam Constitutional:      General: She is not in acute distress.    Comments: Voice is congested  HENT:  Head: Normocephalic and atraumatic.     Mouth/Throat:     Pharynx: Oropharynx is clear. Uvula midline. No pharyngeal swelling, oropharyngeal exudate, posterior oropharyngeal erythema or uvula swelling.  Eyes:     Conjunctiva/sclera: Conjunctivae normal.     Pupils: Pupils are equal, round, and reactive to light.  Cardiovascular:     Rate and Rhythm: Normal rate and regular rhythm.  Pulmonary:     Effort: Pulmonary effort is normal. No respiratory distress.  Abdominal:     General: There is no distension.     Tenderness: There is no abdominal tenderness.  Skin:    General: Skin is warm and dry.  Neurological:     General: No focal deficit present.     Mental Status: She is alert. Mental status is at baseline.  Psychiatric:         Mood and Affect: Mood normal.        Behavior: Behavior normal.     ED Results / Procedures / Treatments   Labs (all labs ordered are listed, but only abnormal results are displayed) Labs Reviewed  RESP PANEL BY RT-PCR (RSV, FLU A&B, COVID)  RVPGX2    EKG None  Radiology No results found.  Procedures Procedures    Medications Ordered in ED Medications  guaiFENesin  (ROBITUSSIN) 100 MG/5ML liquid 5 mL (has no administration in time range)  oxymetazoline (AFRIN) 0.05 % nasal spray 1 spray (has no administration in time range)    ED Course/ Medical Decision Making/ A&P                                 Medical Decision Making Risk OTC drugs.   Patient is presenting with sinus congestion, postnasal drip, productive cough.  I most likely suspect this is a viral syndrome, likely some sinusitis.  I do not see clear evidence of bacterial sinusitis at this time including fever.  Do not see evidence of strep pharyngitis, peritonsillar abscess, no stridor, her airway exam is patent and oral exam is unremarkable.  I have a very low suspicion for bacterial pneumonia.  I suspect she is suffering from postnasal drip.  I think she would benefit from a short course of Afrin, 2 to 3 days of the decongestant, in addition to neti pot or warm humidified air, and Mucinex  as a decongestant.  A watch and wait prescription for doxycycline  was provided but I made clear to the patient that she should not initiate this unless her symptoms fail to improve after a week or she begins having fevers with her cough.  She verbalized understanding.        Final Clinical Impression(s) / ED Diagnoses Final diagnoses:  Nasal congestion  Post-nasal drip    Rx / DC Orders ED Discharge Orders          Ordered    doxycycline  (VIBRAMYCIN ) 100 MG capsule  2 times daily        10/16/23 0819    guaiFENesin  (MUCINEX ) 600 MG 12 hr tablet  2 times daily        10/16/23 0819    oxymetazoline (AFRIN NASAL SPRAY)  0.05 % nasal spray  2 times daily        10/16/23 0819              Arvilla Birmingham, MD 10/16/23 [id number]

## 2024-04-13 ENCOUNTER — Other Ambulatory Visit: Payer: Self-pay

## 2024-04-13 ENCOUNTER — Emergency Department (HOSPITAL_BASED_OUTPATIENT_CLINIC_OR_DEPARTMENT_OTHER): Admitting: Radiology

## 2024-04-13 ENCOUNTER — Emergency Department (HOSPITAL_BASED_OUTPATIENT_CLINIC_OR_DEPARTMENT_OTHER)
Admission: EM | Admit: 2024-04-13 | Discharge: 2024-04-13 | Disposition: A | Discharge status: Home / Self Care | Attending: Emergency Medicine | Admitting: Emergency Medicine

## 2024-04-13 DIAGNOSIS — Z79899 Other long term (current) drug therapy: Secondary | ICD-10-CM | POA: Diagnosis not present

## 2024-04-13 DIAGNOSIS — J069 Acute upper respiratory infection, unspecified: Secondary | ICD-10-CM | POA: Diagnosis not present

## 2024-04-13 DIAGNOSIS — R059 Cough, unspecified: Secondary | ICD-10-CM | POA: Diagnosis present

## 2024-04-13 LAB — RESP PANEL BY RT-PCR (RSV, FLU A&B, COVID)  RVPGX2
Influenza A by PCR: NEGATIVE
Influenza B by PCR: NEGATIVE
Resp Syncytial Virus by PCR: NEGATIVE
SARS Coronavirus 2 by RT PCR: NEGATIVE

## 2024-04-13 MED ORDER — ACETAMINOPHEN 500 MG PO TABS
1000.0000 mg | ORAL_TABLET | Freq: Once | ORAL | Status: AC
  Administered 2024-04-13: 1000 mg via ORAL
  Filled 2024-04-13: qty 2

## 2024-04-13 MED ORDER — ALBUTEROL SULFATE (2.5 MG/3ML) 0.083% IN NEBU
2.5000 mg | INHALATION_SOLUTION | Freq: Once | RESPIRATORY_TRACT | Status: AC
  Administered 2024-04-13: 2.5 mg via RESPIRATORY_TRACT
  Filled 2024-04-13: qty 3

## 2024-04-13 MED ORDER — IBUPROFEN 400 MG PO TABS
400.0000 mg | ORAL_TABLET | Freq: Once | ORAL | Status: AC
  Administered 2024-04-13: 400 mg via ORAL
  Filled 2024-04-13: qty 1

## 2024-04-13 NOTE — Discharge Instructions (Signed)
 Like we discussed, your chest x-ray did not show any signs of pneumonia.  I do think that nasal irrigation with a Nettie pot can be helpful.  Please follow all of the instructions and make sure that you use water that has previously been boiled.  Follow-up with your primary care doctor.  Return to the emergency department with difficulty with your breathing, fever, worsening cough.

## 2024-04-13 NOTE — ED Triage Notes (Signed)
 Cough, congestions, and runny nose x 4 days. Denies CP or SHOB.

## 2024-04-13 NOTE — ED Provider Notes (Signed)
 [place] EMERGENCY DEPARTMENT AT [facility] Provider Note   CSN: [id number] Arrival date & time: 04/13/24  9242     Patient presents with: Cough   Powell MAHEEN CWIKLA is a 28 y.o. female.   This is a pleasant 28 year old female who is here today for 1 week of nasal congestion cough.  Patient reports that she has been taking over-the-counter medications without significant improvement.  Denies fever or chills.   Cough      Prior to Admission medications   Medication Sig Start Date End Date Taking? Authorizing Provider  amoxicillin -clavulanate (AUGMENTIN ) 875-125 MG tablet Take 1 tablet by mouth every 12 (twelve) hours. 05/04/23   Keith, Kayla N, PA-C  benzonatate  (TESSALON ) 100 MG capsule Take 1 capsule (100 mg total) by mouth every 8 (eight) hours. 06/15/23   Geiple, Joshua, PA-C  buPROPion (WELLBUTRIN XL) 150 MG 24 hr tablet Take 150 mg by mouth daily.    [provider]  cetirizine -pseudoephedrine  (ZYRTEC -D) 5-120 MG tablet Take 1 tablet by mouth daily as needed for allergies. 04/10/23   Silver Fell A, PA  hydrOXYzine (ATARAX/VISTARIL) 10 MG tablet Take 10 mg by mouth 3 (three) times daily as needed.    [provider]  methocarbamol  (ROBAXIN ) 500 MG tablet Take 1 tablet (500 mg total) by mouth 2 (two) times daily. 06/28/23   Donnajean Lynwood DEL, PA-C  methylphenidate 18 MG PO CR tablet Take 18 mg by mouth daily.    [provider]  omeprazole (PRILOSEC) 40 MG capsule Take 40 mg by mouth daily. 12/05/21   [provider]  ondansetron  (ZOFRAN -ODT) 4 MG disintegrating tablet Take 1 tablet (4 mg total) by mouth every 8 (eight) hours as needed for nausea or vomiting. 06/15/23   Desiderio Chew, PA-C  oseltamivir  (TAMIFLU ) 75 MG capsule Take 1 capsule (75 mg total) by mouth every 12 (twelve) hours. 06/15/23   Desiderio Chew, PA-C  rizatriptan (MAXALT) 10 MG tablet Take 10 mg by mouth as needed for migraine. May repeat in 2 hours if needed     [provider]  triamcinolone  (NASACORT ) 55 MCG/ACT AERO nasal inhaler Place 2 sprays into the nose daily. 04/10/23   Silver Fell LABOR, PA    Allergies: Patient has no known allergies.    Review of Systems  Respiratory:  Positive for cough.     Updated Vital Signs BP 128/77 (BP Location: Right Arm)   Pulse 60   Temp 98.1 F (36.7 C) (Temporal)   Resp 18   SpO2 100%   Physical Exam Vitals and nursing note reviewed.  Cardiovascular:     Rate and Rhythm: Normal rate.  Pulmonary:     Effort: Pulmonary effort is normal.     Breath sounds: Rhonchi present. No wheezing.  Abdominal:     General: Abdomen is flat.  Musculoskeletal:        General: Normal range of motion.  Skin:    General: Skin is warm.  Neurological:     General: No focal deficit present.     Mental Status: She is alert.     (all labs ordered are listed, but only abnormal results are displayed) Labs Reviewed  RESP PANEL BY RT-PCR (RSV, FLU A&B, COVID)  [id number]    EKG: None  Radiology: DG Chest 1 View Result Date: 04/13/2024 EXAM: 1 VIEW(S) XRAY OF THE CHEST 04/13/2024 09:03:07 AM COMPARISON: 04/10/2023 CLINICAL HISTORY: cough FINDINGS: LUNGS AND PLEURA: No focal pulmonary opacity. No pleural effusion. No pneumothorax. HEART AND MEDIASTINUM:  No acute abnormality of the cardiac and mediastinal silhouettes. BONES AND SOFT TISSUES: No acute osseous abnormality. IMPRESSION: 1. No acute cardiopulmonary process. Electronically signed by: Lynwood Seip MD 04/13/2024 09:20 AM EST RP Workstation: [id number]     Procedures   Medications Ordered in the ED  acetaminophen  (TYLENOL ) tablet 1,000 mg (has no administration in time range)  ibuprofen  (ADVIL ) tablet 400 mg (has no administration in time range)  albuterol  (PROVENTIL ) (2.5 MG/3ML) 0.083% nebulizer solution 2.5 mg (2.5 mg Nebulization Given 04/13/24 9077)                                    Medical Decision Making 28 year old female here today  for nasal congestion and cough.  Differential diagnoses include viral syndrome, pneumonia, sinus infection.  Plan-based on patient's symptoms, I think likely viral URI.  Child's have some mild rhonchorous lung sounds, will obtain a chest x-ray.  No sinus pain, low suspicion for acute sinusitis.  Reassessment 9:30 AM-my independent review the patient's chest x-ray shows no pneumonia.  Did give the patient some albuterol  as she did have some mild rhonchi when she did seem to improve cough.  Counseled the patient on nasal irrigation.  She will follow-up with her PCP.  Reviewed the patient's most recent PCP office note.  This patient's health care is complicated by the following social determinants of health-lack of access to primary care.  Considered mission for this patient, however with her reassuring workup, she is appropriate for discharge.  Amount and/or Complexity of Data Reviewed Radiology: ordered.  Risk OTC drugs. Prescription drug management.        Final diagnoses:  Viral URI    ED Discharge Orders     None          Mannie Fairy DASEN, DO 04/13/24 0932

## 2024-04-25 IMAGING — DX DG CHEST 1V PORT
1 series · 1 of 1 positions shown · non-contrast
Comparison: Portable chest 04/09/2021 and earlier.

CLINICAL DATA: 25-year-old female with cough and fever. Congestion
and wheezing.

EXAM:
PORTABLE CHEST 1 VIEW

[chest ap]
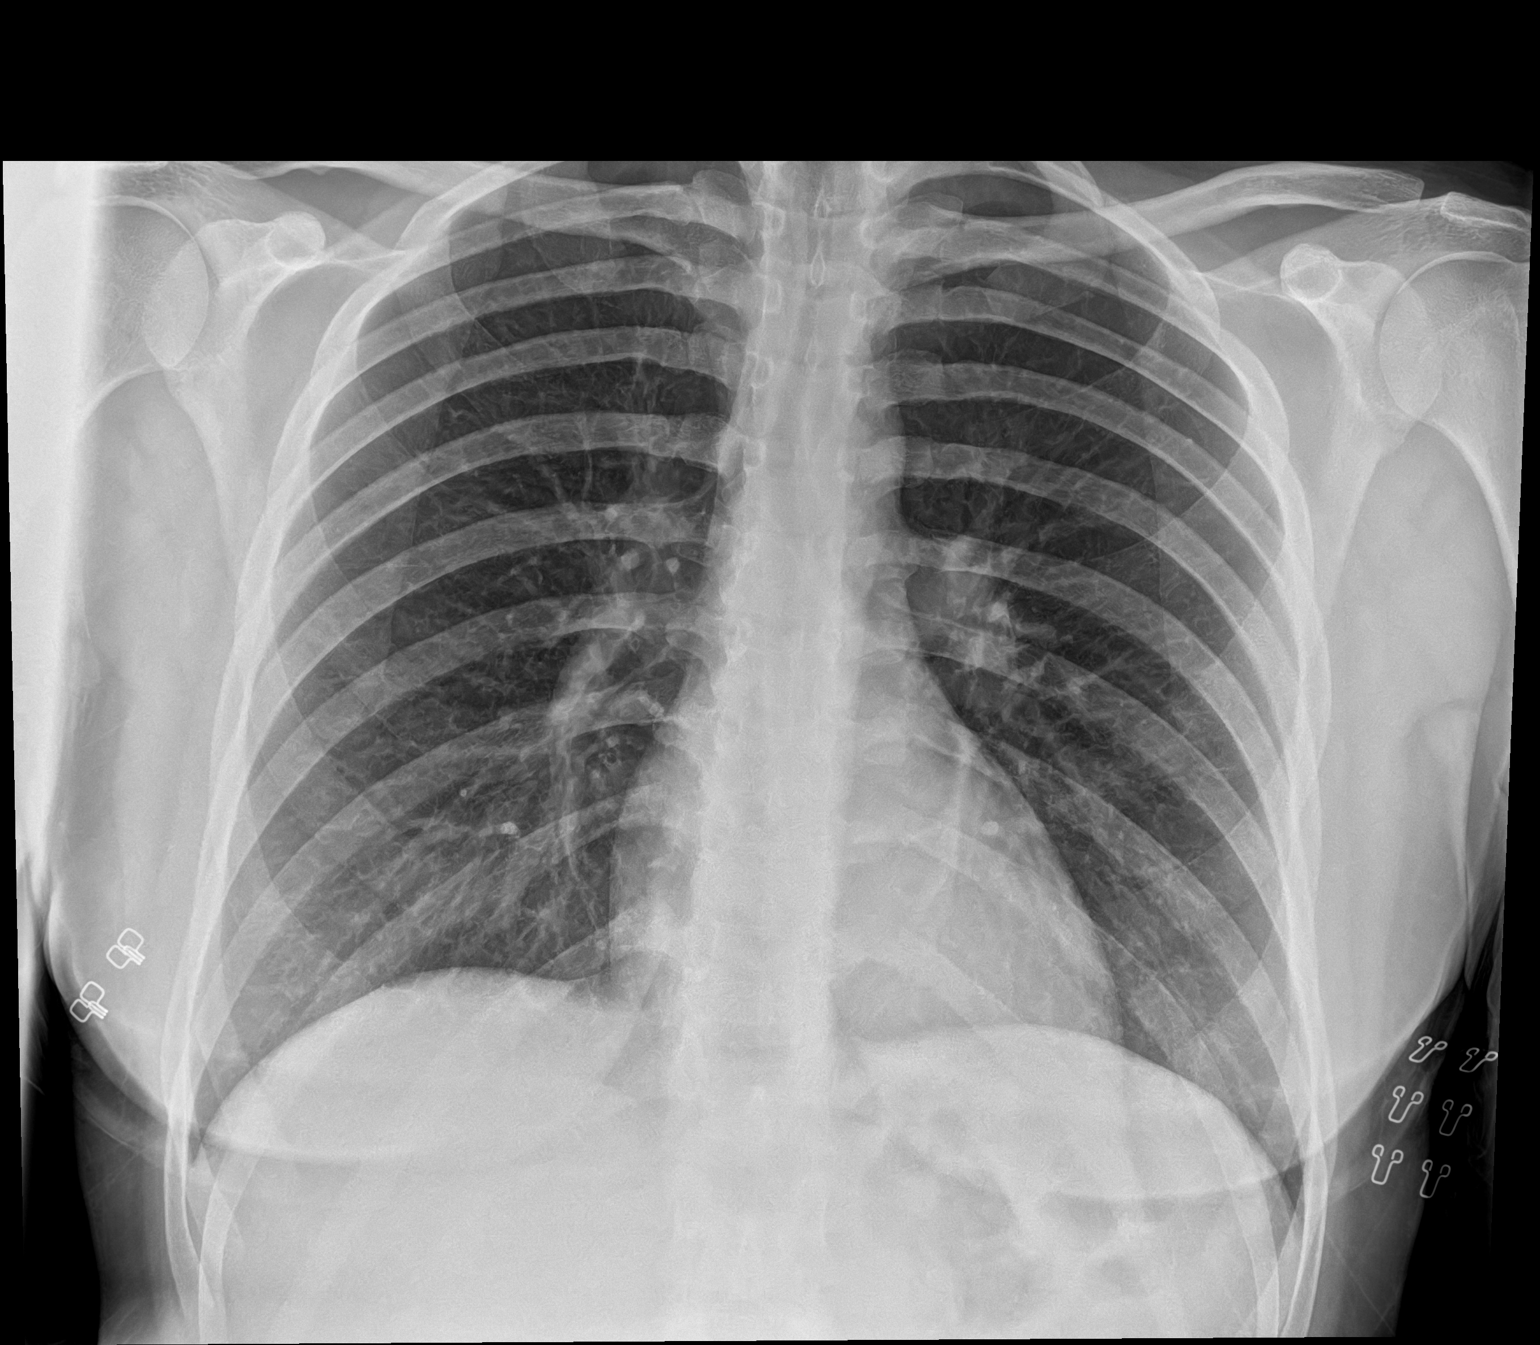

[1 of 1 positions shown; findings below may reference images not displayed]

FINDINGS: Portable AP upright view at 3925 hours. Lung volumes and mediastinal
contours remain normal. Lungs appear stable and clear when allowing
for portable technique. No pneumothorax or pleural effusion.
Visualized tracheal air column is within normal limits. No osseous
abnormality identified. Negative visible bowel gas.
IMPRESSION: Stable and Negative portable chest.

## 2024-05-24 ENCOUNTER — Other Ambulatory Visit: Payer: Self-pay

## 2024-05-24 ENCOUNTER — Emergency Department (HOSPITAL_BASED_OUTPATIENT_CLINIC_OR_DEPARTMENT_OTHER)

## 2024-05-24 ENCOUNTER — Emergency Department (HOSPITAL_BASED_OUTPATIENT_CLINIC_OR_DEPARTMENT_OTHER)
Admission: EM | Admit: 2024-05-24 | Discharge: 2024-05-25 | Disposition: A | Attending: Emergency Medicine | Admitting: Emergency Medicine

## 2024-05-24 ENCOUNTER — Encounter (HOSPITAL_BASED_OUTPATIENT_CLINIC_OR_DEPARTMENT_OTHER): Payer: Self-pay | Admitting: *Deleted

## 2024-05-24 DIAGNOSIS — R000 Tachycardia, unspecified: Secondary | ICD-10-CM | POA: Diagnosis not present

## 2024-05-24 DIAGNOSIS — K8020 Calculus of gallbladder without cholecystitis without obstruction: Secondary | ICD-10-CM | POA: Diagnosis not present

## 2024-05-24 DIAGNOSIS — D72829 Elevated white blood cell count, unspecified: Secondary | ICD-10-CM | POA: Diagnosis not present

## 2024-05-24 DIAGNOSIS — J101 Influenza due to other identified influenza virus with other respiratory manifestations: Secondary | ICD-10-CM | POA: Insufficient documentation

## 2024-05-24 DIAGNOSIS — R739 Hyperglycemia, unspecified: Secondary | ICD-10-CM | POA: Diagnosis not present

## 2024-05-24 DIAGNOSIS — R112 Nausea with vomiting, unspecified: Secondary | ICD-10-CM | POA: Diagnosis present

## 2024-05-24 LAB — COMPREHENSIVE METABOLIC PANEL WITH GFR
ALT: 18 U/L (ref 0–44)
AST: 13 U/L — ABNORMAL LOW (ref 15–41)
Albumin: 4.6 g/dL (ref 3.5–5.0)
Alkaline Phosphatase: 101 U/L (ref 38–126)
Anion gap: 13 (ref 5–15)
BUN: 10 mg/dL (ref 6–20)
CO2: 23 mmol/L (ref 22–32)
Calcium: 9.5 mg/dL (ref 8.9–10.3)
Chloride: 99 mmol/L (ref 98–111)
Creatinine, Ser: 0.63 mg/dL (ref 0.44–1.00)
GFR, Estimated: >60 mL/min
Glucose, Bld: 102 mg/dL — ABNORMAL HIGH (ref 70–99)
Potassium: 3.6 mmol/L (ref 3.5–5.1)
Sodium: 135 mmol/L (ref 135–145)
Total Bilirubin: 0.5 mg/dL (ref 0.0–1.2)
Total Protein: 7.1 g/dL (ref 6.5–8.1)

## 2024-05-24 LAB — CBC WITH DIFFERENTIAL/PLATELET
Abs Immature Granulocytes: 0.06 K/uL (ref 0.00–0.07)
Basophils Absolute: 0.1 K/uL (ref 0.0–0.1)
Basophils Relative: 1 %
Eosinophils Absolute: 0.2 K/uL (ref 0.0–0.5)
Eosinophils Relative: 2 %
HCT: 36.3 % (ref 36.0–46.0)
Hemoglobin: 12.7 g/dL (ref 12.0–15.0)
Immature Granulocytes: 0 %
Lymphocytes Relative: 4 %
Lymphs Abs: 0.6 K/uL — ABNORMAL LOW (ref 0.7–4.0)
MCH: 27.5 pg (ref 26.0–34.0)
MCHC: 35 g/dL (ref 30.0–36.0)
MCV: 78.6 fL — ABNORMAL LOW (ref 80.0–100.0)
Monocytes Absolute: 0.8 K/uL (ref 0.1–1.0)
Monocytes Relative: 6 %
Neutro Abs: 11.7 K/uL — ABNORMAL HIGH (ref 1.7–7.7)
Neutrophils Relative %: 87 %
Platelets: 418 K/uL — ABNORMAL HIGH (ref 150–400)
RBC: 4.62 MIL/uL (ref 3.87–5.11)
RDW: 12.2 % (ref 11.5–15.5)
WBC: 13.5 K/uL — ABNORMAL HIGH (ref 4.0–10.5)
nRBC: 0 % (ref 0.0–0.2)

## 2024-05-24 LAB — LIPASE, BLOOD: Lipase: 26 U/L (ref 11–51)

## 2024-05-24 LAB — HCG, SERUM, QUALITATIVE: Preg, Serum: NEGATIVE

## 2024-05-24 MED ORDER — ACETAMINOPHEN 500 MG PO TABS
1000.0000 mg | ORAL_TABLET | Freq: Once | ORAL | Status: AC
  Administered 2024-05-24: 1000 mg via ORAL
  Filled 2024-05-24: qty 2

## 2024-05-24 MED ORDER — ONDANSETRON HCL 4 MG/2ML IJ SOLN
4.0000 mg | Freq: Once | INTRAMUSCULAR | Status: AC | PRN
  Administered 2024-05-24: 4 mg via INTRAVENOUS
  Filled 2024-05-24: qty 2

## 2024-05-24 MED ORDER — SODIUM CHLORIDE 0.9 % IV BOLUS
1000.0000 mL | Freq: Once | INTRAVENOUS | Status: AC
  Administered 2024-05-24: 1000 mL via INTRAVENOUS

## 2024-05-24 MED ORDER — LORAZEPAM 2 MG/ML IJ SOLN
1.0000 mg | Freq: Once | INTRAMUSCULAR | Status: AC
  Administered 2024-05-24: 1 mg via INTRAVENOUS
  Filled 2024-05-24: qty 1

## 2024-05-24 MED ORDER — KETOROLAC TROMETHAMINE 15 MG/ML IJ SOLN
15.0000 mg | Freq: Once | INTRAMUSCULAR | Status: AC
  Administered 2024-05-24: 15 mg via INTRAVENOUS
  Filled 2024-05-24: qty 1

## 2024-05-24 MED ORDER — MORPHINE SULFATE (PF) 4 MG/ML IV SOLN
4.0000 mg | Freq: Once | INTRAVENOUS | Status: DC
  Filled 2024-05-24: qty 1

## 2024-05-24 NOTE — Discharge Instructions (Signed)
 You were evaluated in the emergency room for abdominal pain.  Your lab work was notable for mildly elevated white blood cell count which can be elevated for a number of things including infection or vomiting.  Remainder of your lab work and imaging was without any evidence of infection.  Please call the number on the sheet to follow-up with a general surgeon.  If you experience any new or worsening symptoms including persistent vomiting, worsening abdominal pain and fevers please return to the emergency room.

## 2024-05-24 NOTE — ED Provider Notes (Incomplete)
 " [place] EMERGENCY DEPARTMENT AT [facility] Provider Note   CSN: [id number] Arrival date & time: 05/24/24  2201     Patient presents with: Abdominal Pain   Cassandra Hill is a 29 y.o. female with history of cholelithiasis presents with abdominal pain with associated nausea and vomiting that started about an hour ago after eating.  Denies any diarrhea.  No urinary or vaginal symptoms.  States that she feels like her gallstones are acting up.  Patient reports that she has been coughing since yesterday with a sore throat and feeling nasal congestion.  She notes that she missed her anxiety medication tonight and is tearful and feeling very anxious.  {Add pertinent medical, surgical, social history, OB history to HPI:32947}  Abdominal Pain     Past Medical History:  Diagnosis Date   Arthritis    Back pain    IBS (irritable bowel syndrome)    Migraine    Past Surgical History:  Procedure Laterality Date   INDUCED ABORTION     tubes in ears       Prior to Admission medications  Medication Sig Start Date End Date Taking? Authorizing Provider  amoxicillin -clavulanate (AUGMENTIN ) 875-125 MG tablet Take 1 tablet by mouth every 12 (twelve) hours. 05/04/23   Keith, Kayla N, PA-C  benzonatate  (TESSALON ) 100 MG capsule Take 1 capsule (100 mg total) by mouth every 8 (eight) hours. 06/15/23   Geiple, Joshua, PA-C  buPROPion (WELLBUTRIN XL) 150 MG 24 hr tablet Take 150 mg by mouth daily.    [provider]  cetirizine -pseudoephedrine  (ZYRTEC -D) 5-120 MG tablet Take 1 tablet by mouth daily as needed for allergies. 04/10/23   Silver Fell A, PA  hydrOXYzine (ATARAX/VISTARIL) 10 MG tablet Take 10 mg by mouth 3 (three) times daily as needed.    [provider]  methocarbamol  (ROBAXIN ) 500 MG tablet Take 1 tablet (500 mg total) by mouth 2 (two) times daily. 06/28/23   Donnajean Lynwood DEL, PA-C  methylphenidate 18 MG PO CR tablet Take 18 mg by mouth daily.     [provider]  omeprazole (PRILOSEC) 40 MG capsule Take 40 mg by mouth daily. 12/05/21   [provider]  ondansetron  (ZOFRAN -ODT) 4 MG disintegrating tablet Take 1 tablet (4 mg total) by mouth every 8 (eight) hours as needed for nausea or vomiting. 06/15/23   Geiple, Joshua, PA-C  oseltamivir  (TAMIFLU ) 75 MG capsule Take 1 capsule (75 mg total) by mouth every 12 (twelve) hours. 06/15/23   Desiderio Chew, PA-C  rizatriptan (MAXALT) 10 MG tablet Take 10 mg by mouth as needed for migraine. May repeat in 2 hours if needed    [provider]  triamcinolone  (NASACORT ) 55 MCG/ACT AERO nasal inhaler Place 2 sprays into the nose daily. 04/10/23   Silver Fell LABOR, PA    Allergies: Patient has no known allergies.    Review of Systems  Gastrointestinal:  Positive for abdominal pain.    Updated Vital Signs BP (!) 140/85   Pulse (!) 110   Temp 99.2 F (37.3 C) (Oral)   Resp 20   SpO2 99%   Physical Exam Vitals and nursing note reviewed.  Constitutional:      General: She is not in acute distress.    Appearance: She is well-developed.     Comments: Patient is tearful and anxious appearing  HENT:     Head: Normocephalic and atraumatic.  Eyes:     Conjunctiva/sclera: Conjunctivae normal.  Cardiovascular:  Rate and Rhythm: Regular rhythm. Tachycardia present.     Heart sounds: No murmur heard. Pulmonary:     Effort: Pulmonary effort is normal. No respiratory distress.     Breath sounds: Normal breath sounds.  Abdominal:     Palpations: Abdomen is soft.     Tenderness: There is abdominal tenderness.     Comments: Tender to right upper quadrant, soft nondistended  Musculoskeletal:        General: No swelling.     Cervical back: Neck supple.  Skin:    General: Skin is warm and dry.     Capillary Refill: Capillary refill takes less than 2 seconds.  Neurological:     Mental Status: She is alert.  Psychiatric:        Mood and Affect: Mood normal.     (all  labs ordered are listed, but only abnormal results are displayed) Labs Reviewed  CBC WITH DIFFERENTIAL/PLATELET  COMPREHENSIVE METABOLIC PANEL WITH GFR  LIPASE, BLOOD  URINALYSIS, ROUTINE W REFLEX MICROSCOPIC  PREGNANCY, URINE    EKG: None  Radiology: No results found.  {Document cardiac monitor, telemetry assessment procedure when appropriate:32947} Procedures   Medications Ordered in the ED  ondansetron  (ZOFRAN ) injection 4 mg (4 mg Intravenous Given 05/24/24 2220)    Clinical Course as of 05/24/24 2350  Mon May 24, 2024  2234 Patient with history of cholelithiasis evaluated for abdominal pain with associated nausea and vomiting.  Upon arrival patient is tachycardic and uncomfortable appearing on exam she has tenderness to the right upper quadrant.  Will obtain routine labs and right upper quadrant ultrasound [JT]  2301 CBC with Differential(!) Leukocytosis of 13.5 [JT]  2301 Comprehensive metabolic panel(!) No significant abnormality [JT]  2301 Lipase, blood Within normal limit [JT]  2348 US  Abdomen Limited RUQ (LIVER/GB) Cholelithiasis without any evidence of cholecystitis [JT]  2348 Patient reporting improvement of symptoms. Will po challenge [JT]    Clinical Course User Index [JT] Donnajean Lynwood DEL, PA-C   {Click here for ABCD2, HEART and other calculators REFRESH Note before signing:1}                              Medical Decision Making Amount and/or Complexity of Data Reviewed Labs: ordered. Decision-making details documented in ED Course. Radiology: ordered.  Risk Prescription drug management.   This patient presents to the ED with chief complaint(s) of Abdominal pain .  The complaint involves an extensive differential diagnosis and also carries with it a high risk of complications and morbidity.   Pertinent past medical history as listed in HPI  The differential diagnosis includes  Cholelithiasis, cholecystitis, nephrolithiasis, pyelonephritis,  cystitis Additional history obtained: Records reviewed Care Everywhere/External Records  Disposition:   ***  Social Determinants of Health:   none  This note was dictated with voice recognition software.  Despite best efforts at proofreading, errors may have occurred which can change the documentation meaning.    {Document critical care time when appropriate  Document review of labs and clinical decision tools ie CHADS2VASC2, etc  Document your independent review of radiology images and any outside records  Document your discussion with family members, caretakers and with consultants  Document social determinants of health affecting pt's care  Document your decision making why or why not admission, treatments were needed:32947:::1}   Final diagnoses:  None    ED Discharge Orders     None        "

## 2024-05-24 NOTE — ED Notes (Signed)
 US  at the bedside

## 2024-05-24 NOTE — ED Triage Notes (Signed)
 Pt reports onset of upper abdominal pain, nausea and vomiting tonight. She has hx of gallstones and this feels similar.

## 2024-05-24 NOTE — ED Provider Notes (Incomplete)
 " [place] EMERGENCY DEPARTMENT AT [facility] Provider Note   CSN: [id number] Arrival date & time: 05/24/24  2201     Patient presents with: Abdominal Pain   Cassandra Hill is a 29 y.o. female with history of cholelithiasis presents with abdominal pain with associated nausea and vomiting that started about an hour ago after eating.  Denies any diarrhea.  No urinary or vaginal symptoms.  States that she feels like her gallstones are acting up.  {Add pertinent medical, surgical, social history, OB history to HPI:32947}  Abdominal Pain     Past Medical History:  Diagnosis Date   Arthritis    Back pain    IBS (irritable bowel syndrome)    Migraine    Past Surgical History:  Procedure Laterality Date   INDUCED ABORTION     tubes in ears       Prior to Admission medications  Medication Sig Start Date End Date Taking? Authorizing Provider  amoxicillin -clavulanate (AUGMENTIN ) 875-125 MG tablet Take 1 tablet by mouth every 12 (twelve) hours. 05/04/23   Keith, Kayla N, PA-C  benzonatate  (TESSALON ) 100 MG capsule Take 1 capsule (100 mg total) by mouth every 8 (eight) hours. 06/15/23   Geiple, Joshua, PA-C  buPROPion (WELLBUTRIN XL) 150 MG 24 hr tablet Take 150 mg by mouth daily.    [provider]  cetirizine -pseudoephedrine  (ZYRTEC -D) 5-120 MG tablet Take 1 tablet by mouth daily as needed for allergies. 04/10/23   Silver Fell A, PA  hydrOXYzine (ATARAX/VISTARIL) 10 MG tablet Take 10 mg by mouth 3 (three) times daily as needed.    [provider]  methocarbamol  (ROBAXIN ) 500 MG tablet Take 1 tablet (500 mg total) by mouth 2 (two) times daily. 06/28/23   Donnajean Lynwood DEL, PA-C  methylphenidate 18 MG PO CR tablet Take 18 mg by mouth daily.    [provider]  omeprazole (PRILOSEC) 40 MG capsule Take 40 mg by mouth daily. 12/05/21   [provider]  ondansetron  (ZOFRAN -ODT) 4 MG disintegrating tablet Take 1 tablet (4 mg total) by  mouth every 8 (eight) hours as needed for nausea or vomiting. 06/15/23   Desiderio Chew, PA-C  oseltamivir  (TAMIFLU ) 75 MG capsule Take 1 capsule (75 mg total) by mouth every 12 (twelve) hours. 06/15/23   Desiderio Chew, PA-C  rizatriptan (MAXALT) 10 MG tablet Take 10 mg by mouth as needed for migraine. May repeat in 2 hours if needed    [provider]  triamcinolone  (NASACORT ) 55 MCG/ACT AERO nasal inhaler Place 2 sprays into the nose daily. 04/10/23   Silver Fell LABOR, PA    Allergies: Patient has no known allergies.    Review of Systems  Gastrointestinal:  Positive for abdominal pain.    Updated Vital Signs BP (!) 140/85   Pulse (!) 110   Temp 99.2 F (37.3 C) (Oral)   Resp 20   SpO2 99%   Physical Exam Vitals and nursing note reviewed.  Constitutional:      General: She is not in acute distress.    Appearance: She is well-developed.     Comments: Patient is tearful and anxious appearing  HENT:     Head: Normocephalic and atraumatic.  Eyes:     Conjunctiva/sclera: Conjunctivae normal.  Cardiovascular:     Rate and Rhythm: Normal rate and regular rhythm.     Heart sounds: No murmur heard. Pulmonary:     Effort: Pulmonary effort is normal. No respiratory distress.     Breath  sounds: Normal breath sounds.  Abdominal:     Palpations: Abdomen is soft.     Tenderness: There is abdominal tenderness.     Comments: Tender to right upper quadrant, soft nondistended  Musculoskeletal:        General: No swelling.     Cervical back: Neck supple.  Skin:    General: Skin is warm and dry.     Capillary Refill: Capillary refill takes less than 2 seconds.  Neurological:     Mental Status: She is alert.  Psychiatric:        Mood and Affect: Mood normal.     (all labs ordered are listed, but only abnormal results are displayed) Labs Reviewed  CBC WITH DIFFERENTIAL/PLATELET  COMPREHENSIVE METABOLIC PANEL WITH GFR  LIPASE, BLOOD  URINALYSIS, ROUTINE W REFLEX MICROSCOPIC   PREGNANCY, URINE    EKG: None  Radiology: No results found.  {Document cardiac monitor, telemetry assessment procedure when appropriate:32947} Procedures   Medications Ordered in the ED  ondansetron  (ZOFRAN ) injection 4 mg (4 mg Intravenous Given 05/24/24 2220)    Clinical Course as of 05/24/24 2234  Mon May 24, 2024  2234 Patient with history of cholelithiasis evaluated for abdominal pain with associated nausea and vomiting.  Upon arrival patient is tachycardic and uncomfortable appearing on exam she has tenderness to the right upper quadrant.  Will obtain routine labs and right upper quadrant ultrasound [JT]    Clinical Course User Index [JT] Donnajean Lynwood DEL, PA-C   {Click here for ABCD2, HEART and other calculators REFRESH Note before signing:1}                              Medical Decision Making Amount and/or Complexity of Data Reviewed Labs: ordered.  Risk Prescription drug management.   This patient presents to the ED with chief complaint(s) of Abdominal pain .  The complaint involves an extensive differential diagnosis and also carries with it a high risk of complications and morbidity.   Pertinent past medical history as listed in HPI  The differential diagnosis includes  Cholelithiasis, cholecystitis, nephrolithiasis, pyelonephritis, cystitis Additional history obtained: Records reviewed Care Everywhere/External Records  Disposition:   ***  Social Determinants of Health:   none  This note was dictated with voice recognition software.  Despite best efforts at proofreading, errors may have occurred which can change the documentation meaning.    {Document critical care time when appropriate  Document review of labs and clinical decision tools ie CHADS2VASC2, etc  Document your independent review of radiology images and any outside records  Document your discussion with family members, caretakers and with consultants  Document social determinants of  health affecting pt's care  Document your decision making why or why not admission, treatments were needed:32947:::1}   Final diagnoses:  None    ED Discharge Orders     None        "

## 2024-05-25 ENCOUNTER — Other Ambulatory Visit (HOSPITAL_BASED_OUTPATIENT_CLINIC_OR_DEPARTMENT_OTHER): Payer: Self-pay

## 2024-05-25 ENCOUNTER — Encounter (HOSPITAL_BASED_OUTPATIENT_CLINIC_OR_DEPARTMENT_OTHER): Payer: Self-pay | Admitting: Emergency Medicine

## 2024-05-25 ENCOUNTER — Emergency Department (HOSPITAL_BASED_OUTPATIENT_CLINIC_OR_DEPARTMENT_OTHER)
Admission: EM | Admit: 2024-05-25 | Discharge: 2024-05-25 | Disposition: A | Discharge status: Home / Self Care | Source: Home / Self Care | Attending: Emergency Medicine | Admitting: Emergency Medicine

## 2024-05-25 DIAGNOSIS — J101 Influenza due to other identified influenza virus with other respiratory manifestations: Secondary | ICD-10-CM | POA: Insufficient documentation

## 2024-05-25 DIAGNOSIS — R739 Hyperglycemia, unspecified: Secondary | ICD-10-CM | POA: Insufficient documentation

## 2024-05-25 DIAGNOSIS — D72829 Elevated white blood cell count, unspecified: Secondary | ICD-10-CM | POA: Insufficient documentation

## 2024-05-25 DIAGNOSIS — R000 Tachycardia, unspecified: Secondary | ICD-10-CM | POA: Insufficient documentation

## 2024-05-25 LAB — RESP PANEL BY RT-PCR (RSV, FLU A&B, COVID)  RVPGX2
Influenza A by PCR: POSITIVE — AB
Influenza B by PCR: NEGATIVE
Resp Syncytial Virus by PCR: NEGATIVE
SARS Coronavirus 2 by RT PCR: NEGATIVE

## 2024-05-25 MED ORDER — ONDANSETRON HCL 4 MG/2ML IJ SOLN
4.0000 mg | Freq: Once | INTRAMUSCULAR | Status: AC
  Administered 2024-05-25: 4 mg via INTRAVENOUS
  Filled 2024-05-25: qty 2

## 2024-05-25 MED ORDER — SODIUM CHLORIDE 0.9 % IV BOLUS
1000.0000 mL | Freq: Once | INTRAVENOUS | Status: AC
  Administered 2024-05-25: 1000 mL via INTRAVENOUS

## 2024-05-25 MED ORDER — ONDANSETRON 4 MG PO TBDP: 4.0000 mg | ORAL_TABLET | Freq: Three times a day (TID) | ORAL | 0 refills | Status: DC | PRN

## 2024-05-25 MED ORDER — ONDANSETRON 4 MG PO TBDP
4.0000 mg | ORAL_TABLET | Freq: Three times a day (TID) | ORAL | 0 refills | Status: AC | PRN
  Filled 2024-05-25: qty 20, 7d supply, fill #0

## 2024-05-25 MED ORDER — ONDANSETRON HCL 4 MG/2ML IJ SOLN: 4.0000 mg | Freq: Once | INTRAMUSCULAR | Status: DC

## 2024-05-25 MED ORDER — HYDROCODONE-ACETAMINOPHEN 5-325 MG PO TABS: 1.0000 | ORAL_TABLET | Freq: Four times a day (QID) | ORAL | 0 refills | Status: AC | PRN

## 2024-05-25 NOTE — ED Provider Notes (Signed)
 " [place] EMERGENCY DEPARTMENT AT [facility] Provider Note   CSN: [id number] Arrival date & time: 05/25/24  9097     Patient presents with: Emesis   Cassandra Hill is a 29 y.o. female with PMHx OA, IBS, migraines who presents to ED concerned for ongoing nausea and vomiting over the past 24 hours. Patient also with coughing and nasal congestion. Patient was seen at ED yesterday and found to be positive for Flu A and discharged home in stable condition. Patient stating that she was not able to pick up her prescribed nausea medications and started vomiting again. Patient stating that the Zofran  given to her in the ED yesterday did help with the nausea and vomiting.  Patient denies dysuria, hematuria.  Patient denies worsening of symptoms, just states that she started vomiting again and her husband had yet to pick up her prescriptions as the pharmacy just opened. Patient currently denying abdominal pain.     Emesis      Prior to Admission medications  Medication Sig Start Date End Date Taking? Authorizing Provider  amoxicillin -clavulanate (AUGMENTIN ) 875-125 MG tablet Take 1 tablet by mouth every 12 (twelve) hours. 05/04/23   Keith, Kayla N, PA-C  benzonatate  (TESSALON ) 100 MG capsule Take 1 capsule (100 mg total) by mouth every 8 (eight) hours. 06/15/23   Geiple, Joshua, PA-C  buPROPion (WELLBUTRIN XL) 150 MG 24 hr tablet Take 150 mg by mouth daily.    [provider]  cetirizine -pseudoephedrine  (ZYRTEC -D) 5-120 MG tablet Take 1 tablet by mouth daily as needed for allergies. 04/10/23   Silver Wonda LABOR, PA  HYDROcodone -acetaminophen  (NORCO/VICODIN) 5-325 MG tablet Take 1 tablet by mouth every 6 (six) hours as needed for severe pain (pain score 7-10). 05/25/24   Roselyn Carlin NOVAK, MD  hydrOXYzine (ATARAX/VISTARIL) 10 MG tablet Take 10 mg by mouth 3 (three) times daily as needed.    [provider]  methocarbamol  (ROBAXIN ) 500 MG tablet Take 1 tablet (500 mg  total) by mouth 2 (two) times daily. 06/28/23   Donnajean Lynwood DEL, PA-C  methylphenidate 18 MG PO CR tablet Take 18 mg by mouth daily.    [provider]  omeprazole (PRILOSEC) 40 MG capsule Take 40 mg by mouth daily. 12/05/21   [provider]  ondansetron  (ZOFRAN -ODT) 4 MG disintegrating tablet Take 1 tablet (4 mg total) by mouth every 8 (eight) hours as needed for nausea or vomiting. 05/25/24   Hoy Nidia FALCON, PA-C  oseltamivir  (TAMIFLU ) 75 MG capsule Take 1 capsule (75 mg total) by mouth every 12 (twelve) hours. 06/15/23   Desiderio Chew, PA-C  rizatriptan (MAXALT) 10 MG tablet Take 10 mg by mouth as needed for migraine. May repeat in 2 hours if needed    [provider]  triamcinolone  (NASACORT ) 55 MCG/ACT AERO nasal inhaler Place 2 sprays into the nose daily. 04/10/23   Silver Wonda LABOR, PA    Allergies: Patient has no known allergies.    Review of Systems  Gastrointestinal:  Positive for vomiting.    Updated Vital Signs BP 123/80   Pulse 84   Temp 98.5 F (36.9 C) (Oral)   Resp 16   Wt 84.4 kg   SpO2 97%   BMI 31.93 kg/m   Physical Exam Vitals and nursing note reviewed.  Constitutional:      General: She is not in acute distress.    Appearance: She is not ill-appearing or toxic-appearing.  HENT:     Head: Normocephalic and atraumatic.  Mouth/Throat:     Mouth: Mucous membranes are moist.  Eyes:     General: No scleral icterus.       Right eye: No discharge.        Left eye: No discharge.     Conjunctiva/sclera: Conjunctivae normal.  Cardiovascular:     Rate and Rhythm: Regular rhythm. Tachycardia present.     Pulses: Normal pulses.     Heart sounds: Normal heart sounds. No murmur heard. Pulmonary:     Effort: Pulmonary effort is normal. No respiratory distress.     Breath sounds: Normal breath sounds. No wheezing, rhonchi or rales.  Abdominal:     General: Abdomen is flat. Bowel sounds are normal. There is no distension.      Palpations: Abdomen is soft. There is no mass.     Tenderness: There is no abdominal tenderness.  Musculoskeletal:     Right lower leg: No edema.     Left lower leg: No edema.  Skin:    General: Skin is warm and dry.     Findings: No rash.  Neurological:     General: No focal deficit present.     Mental Status: She is alert and oriented to person, place, and time. Mental status is at baseline.  Psychiatric:        Mood and Affect: Mood normal.     (all labs ordered are listed, but only abnormal results are displayed) Labs Reviewed - No data to display  EKG: None  Radiology: US  Abdomen Limited RUQ (LIVER/GB) Result Date: 05/24/2024 EXAM: Right Upper Quadrant Abdominal Ultrasound 05/24/2024 11:21:18 PM TECHNIQUE: Real-time ultrasonography of the right upper quadrant of the abdomen was performed. COMPARISON: None available. CLINICAL HISTORY: 855384 Pain [id number] Pain FINDINGS: LIVER: Normal echogenicity. No intrahepatic biliary ductal dilatation. No evidence of mass. Hepatopetal flow in the portal vein. BILIARY SYSTEM: Multiple gallstones seen within the gallbladder. The gallbladder is not distended, there is no gallbladder wall thickening, and no pericholecystic fluid is identified. The sonographic Beverley sign is reportedly negative. Common bile duct measures 5 mm in diameter proximally. RIGHT KIDNEY: Limited images of the right kidney are unremarkable. No hydronephrosis. PANCREAS: The pancreas is obscured by overlying bowel gas. OTHER: No right upper quadrant ascites. IMPRESSION: 1. Cholelithiasis without sonographic evidence of acute cholecystitis. Electronically signed by: Dorethia Molt MD MD 05/24/2024 11:27 PM EST RP Workstation: [id number]     Procedures   Medications Ordered in the ED  ondansetron  (ZOFRAN ) injection 4 mg (has no administration in time range)  ondansetron  (ZOFRAN ) injection 4 mg (4 mg Intravenous Given 05/25/24 0926)  sodium chloride  0.9 % bolus 1,000 mL (0 mLs  Intravenous Stopped 05/25/24 1036)                                    Medical Decision Making Risk Prescription drug management.    This patient presents to the ED for concern of cough, vomiting, congestion, this involves an extensive number of treatment options, and is a complaint that carries with it a high risk of complications and morbidity.  The differential diagnosis includes Flu/COVID/RSV, strep pharyngitis, sinusitis,  pneumonia, meningitis.   Co morbidities that complicate the patient evaluation  OA, IBS, migraines    Additional history obtained:  Additional history obtained from 1/12 ED note: hCG: Negative Respiratory panel: Flu A+ Lipase: Within normal limits CMP: Mild hyperglycemia at 102. CBC: Leukocytosis 30.5.  No anemia.  Problem List / ED Course / Critical interventions / Medication management  Patient presents to ED concern for nausea and vomiting over the past 24 hours.  Diagnosed with flu A during ED visit yesterday.  Has not yet been able to pick up nausea medicine.  Started having nausea and vomiting again this morning so she came back to the ED.  Endorses having good relief of symptoms with Zofran  given to her in emergency room yesterday. Patient denying worsening of symptoms today - just concerned because she started vomiting again and did not have medicines at home.  Abdominal pain.  I did not think that repeat labs were necessary today.  Patient agrees to withhold repeat labs today. Will provide with zofran  and IV fluids and reassess patient and send nausea medication to pharmacy here at [facility] for patient's convenience. I reevaluated patient.  She is feeling a lot better after IV fluids and Zofran  and is ready to go home.  Zofran  prescriptions have already been sent to the [facility] pharmacy here.  Patient agrees to follow-up with PCP and is ready go home. I have reviewed the patients home medicines and have made adjustments as needed The patient has been  appropriately medically screened and/or stabilized in the ED. I have low suspicion for any other emergent medical condition which would require further screening, evaluation or treatment in the ED or require inpatient management. At time of discharge the patient is hemodynamically stable and in no acute distress. I have discussed work-up results and diagnosis with patient and answered all questions. Patient is agreeable with discharge plan. We discussed strict return precautions for returning to the emergency department and they verbalized understanding.      Social Determinants of Health:  none      Final diagnoses:  Influenza A    ED Discharge Orders          Ordered    ondansetron  (ZOFRAN -ODT) 4 MG disintegrating tablet  Every 8 hours PRN        05/25/24 1005               Hoy Nidia FALCON, [place] 05/25/24 1204    Dreama Longs, MD 05/25/24 2236  "

## 2024-05-25 NOTE — Discharge Instructions (Addendum)
 Please follow-up with your primary care provider.  Seek emergency care if experiencing any new or worsening symptoms.

## 2024-05-25 NOTE — ED Provider Notes (Signed)
 Care assumed at shift change. Here with abdominal pain as well as URI symptoms. She has a gall stone but no infection. Awaiting Covid/Flu/RSV swab and recheck of HR after IVF.  Physical Exam  BP 127/79   Pulse (!) 117   Temp 99.2 F (37.3 C) (Oral)   Resp 18   SpO2 98%   Physical Exam  Procedures  Procedures  ED Course / MDM   Clinical Course as of 05/25/24 0123  Mon May 24, 2024  2234 Patient with history of cholelithiasis evaluated for abdominal pain with associated nausea and vomiting, also having flu like symptoms.  Upon arrival patient is tachycardic and uncomfortable appearing on exam she has tenderness to the right upper quadrant.  Will obtain routine labs and right upper quadrant ultrasound [JT]  2301 CBC with Differential(!) Leukocytosis of 13.5 [JT]  2301 Comprehensive metabolic panel(!) No significant abnormality [JT]  2301 Lipase, blood Within normal limit [JT]  2348 US  Abdomen Limited RUQ (LIVER/GB) Cholelithiasis without any evidence of cholecystitis [JT]  2348 Patient reporting improvement of symptoms. Will po challenge [JT]  2354 Successful PO challenge. Reporting improvement of symptoms.  has improved and she is afebrile. She does have a mild leukocytosis, although this is also in the setting of flu like symptoms. With her US  showing no evidence of cholecystitis and her labwork without any evidence of osbtruction, plan is for discharge with strict return precautions and general surgery follow up. Still tachycardic to 117, however, at this time. Will obtain resp panel, provide another liter of fluids and reevaluate before discharge. Signout given to Dr. Roselyn.  [JT]  Tue May 25, 2024  0109 Covid/Flu/RSV swab is positive for influenza. HR improved and now under 100. Plan discharge with symptomatic treatment for both gall stones and flu. Recommend outpatient Gen Surg follow up when recovered. RTED for any other concerns in the meantime.  [CS]    Clinical Course User  Index [CS] Roselyn Carlin NOVAK, MD [JT] Donnajean Lynwood DEL, PA-C   Medical Decision Making Amount and/or Complexity of Data Reviewed Labs:  Decision-making details documented in ED Course.  Risk OTC drugs. Prescription drug management.          Roselyn Carlin NOVAK, MD 05/25/24 [phone]

## 2024-05-25 NOTE — ED Triage Notes (Signed)
 Pt recent dx with flu, endorses emesis. Was not able to get rx
